# Patient Record
Sex: Female | Born: 1980 | Race: White | Hispanic: No | Marital: Single | Smoking: Never smoker
Health system: Southern US, Community
[De-identification: ages and names within clinical notes are randomized; demographics above are authoritative.]

## PROBLEM LIST (undated history)

## (undated) DIAGNOSIS — F329 Major depressive disorder, single episode, unspecified: Secondary | ICD-10-CM

## (undated) DIAGNOSIS — T7840XA Allergy, unspecified, initial encounter: Secondary | ICD-10-CM

## (undated) DIAGNOSIS — F419 Anxiety disorder, unspecified: Secondary | ICD-10-CM

## (undated) DIAGNOSIS — G43909 Migraine, unspecified, not intractable, without status migrainosus: Secondary | ICD-10-CM

## (undated) DIAGNOSIS — Z8049 Family history of malignant neoplasm of other genital organs: Secondary | ICD-10-CM

## (undated) DIAGNOSIS — Z803 Family history of malignant neoplasm of breast: Secondary | ICD-10-CM

## (undated) DIAGNOSIS — J45909 Unspecified asthma, uncomplicated: Secondary | ICD-10-CM

## (undated) DIAGNOSIS — N390 Urinary tract infection, site not specified: Secondary | ICD-10-CM

## (undated) DIAGNOSIS — F32A Depression, unspecified: Secondary | ICD-10-CM

## (undated) HISTORY — PX: BREAST EXCISIONAL BIOPSY: SUR124

## (undated) HISTORY — DX: Family history of malignant neoplasm of breast: Z80.3

## (undated) HISTORY — DX: Migraine, unspecified, not intractable, without status migrainosus: G43.909

## (undated) HISTORY — DX: Allergy, unspecified, initial encounter: T78.40XA

## (undated) HISTORY — DX: Urinary tract infection, site not specified: N39.0

## (undated) HISTORY — DX: Major depressive disorder, single episode, unspecified: F32.9

## (undated) HISTORY — DX: Family history of malignant neoplasm of other genital organs: Z80.49

## (undated) HISTORY — DX: Depression, unspecified: F32.A

---
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
## 2009-03-04 HISTORY — PX: BREAST BIOPSY: SHX20

## 2014-03-30 ENCOUNTER — Encounter (INDEPENDENT_AMBULATORY_CARE_PROVIDER_SITE_OTHER): Payer: Self-pay

## 2014-03-30 ENCOUNTER — Ambulatory Visit (INDEPENDENT_AMBULATORY_CARE_PROVIDER_SITE_OTHER): Payer: BC Managed Care – PPO | Admitting: Family Medicine

## 2014-03-30 ENCOUNTER — Encounter: Payer: Self-pay | Admitting: Family Medicine

## 2014-03-30 VITALS — BP 112/70 | HR 79 | Temp 98.5°F | Ht 63.0 in | Wt 141.8 lb

## 2014-03-30 DIAGNOSIS — J309 Allergic rhinitis, unspecified: Secondary | ICD-10-CM | POA: Insufficient documentation

## 2014-03-30 DIAGNOSIS — G43829 Menstrual migraine, not intractable, without status migrainosus: Secondary | ICD-10-CM

## 2014-03-30 DIAGNOSIS — Z23 Encounter for immunization: Secondary | ICD-10-CM

## 2014-03-30 DIAGNOSIS — J302 Other seasonal allergic rhinitis: Secondary | ICD-10-CM | POA: Diagnosis not present

## 2014-03-30 DIAGNOSIS — Z8659 Personal history of other mental and behavioral disorders: Secondary | ICD-10-CM

## 2014-03-30 DIAGNOSIS — Z1231 Encounter for screening mammogram for malignant neoplasm of breast: Secondary | ICD-10-CM

## 2014-03-30 DIAGNOSIS — Z803 Family history of malignant neoplasm of breast: Secondary | ICD-10-CM

## 2014-03-30 DIAGNOSIS — G43909 Migraine, unspecified, not intractable, without status migrainosus: Secondary | ICD-10-CM | POA: Insufficient documentation

## 2014-03-30 DIAGNOSIS — Z1239 Encounter for other screening for malignant neoplasm of breast: Secondary | ICD-10-CM

## 2014-03-30 DIAGNOSIS — N943 Premenstrual tension syndrome: Secondary | ICD-10-CM

## 2014-03-30 MED ORDER — MICROGESTIN FE 1/20 1-20 MG-MCG PO TABS: 1.0000 | ORAL_TABLET | Freq: Every day | ORAL | Status: DC

## 2014-03-30 NOTE — Patient Instructions (Signed)
Flu shot today  Stop at check out for ref to oncology and also for mammogram  Schedule annual exam with labs prior in April or May

## 2014-03-30 NOTE — Progress Notes (Signed)
Subjective:    Patient ID: Kirsten Wilson, female    DOB: Jan 26, 1981, 34 y.o.   MRN: [id number]  HPI [place] - used to live here  She teaches at [employer]   PMH: Asthma -only when sick/ uses a rescue inhaler  Had it as a kid   Allergies  - seasonal / grass/mold/mildew and dust as well   Depression - once in a while , now now  Has had counseling and medication in the past - last took lexapro  It comes and goes - worse with stress   Migraine - Dr Domingo Cocking  Stays away from caffeine , and also menstrual migraines - right before and after  occ aura but not every time  On imitrex and doing trigger point injections -getting approved for botox (works very well for her)   Has family history of breast cancer  Mother- uterine ca at 69 and then breast cancer shortly after  Paunt - breast cancer in her 40s  She herself is BRCA negative however - she is still considered high risk -now she has yearly mammogram and MRI (breast) with contrast   Last labs Jan with Dr Domingo Cocking - ? Last labs   Due for MRI - June Mammogram now   Not planning on getting pregnant  Will need her OC refilled -does well on that  Not due for pap until march    Patient Active Problem List   Diagnosis Date Noted  . Breast cancer screening, high risk patient 03/30/2014  . Allergic rhinitis 03/30/2014  . Migraine 03/30/2014  . History of depression 03/30/2014  . Family history of breast cancer 03/30/2014   Past Medical History  Diagnosis Date  . Allergy   . Depression   . Migraine   . UTI (lower urinary tract infection)    Past Surgical History  Procedure Laterality Date  . Breast biopsy  2011  . Tonsillectomy and adenoidectomy  1993   History  Substance Use Topics  . Smoking status: Never Smoker   . Smokeless tobacco: Not on file  . Alcohol Use: 0.0 oz/week    0 Not specified per week     Comment: occ   Family History  Problem Relation  Age of Onset  . Cancer Mother     breast and uterus cancer  . Hypertension Mother   . Hyperlipidemia Father   . Hypertension Father   . Diabetes Father   . Cancer Maternal Grandfather     lung  . Cancer Paternal Grandmother     colon cancer  . Alcohol abuse Paternal Grandfather    Allergies  Allergen Reactions  . Codone [Hydrocodone]   . Penicillins   . Sulfur    No current outpatient prescriptions on file prior to visit.   No current facility-administered medications on file prior to visit.    Review of Systems Review of Systems  Constitutional: Negative for fever, appetite change, fatigue and unexpected weight change.  Eyes: Negative for pain and visual disturbance.  Respiratory: Negative for cough and shortness of breath.   Cardiovascular: Negative for cp or palpitations    Gastrointestinal: Negative for nausea, diarrhea and constipation.  Genitourinary: Negative for urgency and frequency.  Skin: Negative for pallor or rash   Neurological: Negative for weakness, light-headedness, numbness and headaches.  Hematological: Negative for adenopathy. Does not bruise/bleed easily.  Psychiatric/Behavioral: Negative for dysphoric mood. The patient is not nervous/anxious.  Objective:   Physical Exam  Constitutional: She appears well-developed and well-nourished. No distress.  HENT:  Head: Normocephalic and atraumatic.  Mouth/Throat: Oropharynx is clear and moist.  Eyes: Conjunctivae and EOM are normal. Pupils are equal, round, and reactive to light. No scleral icterus.  Neck: Normal range of motion. Neck supple. No JVD present. Carotid bruit is not present. No thyromegaly present.  Cardiovascular: Normal rate, regular rhythm, normal heart sounds and intact distal pulses.  Exam reveals no gallop.   Pulmonary/Chest: Effort normal and breath sounds normal. No respiratory distress. She has no wheezes. She has no rales.  Abdominal: Soft. Bowel sounds are normal.    Genitourinary: No breast swelling, tenderness, discharge or bleeding.  Breast exam: No mass, nodules, thickening, tenderness, bulging, retraction, inflamation, nipple discharge or skin changes noted.  No axillary or clavicular LA.     Dense breasts   Musculoskeletal: She exhibits no edema or tenderness.  Lymphadenopathy:    She has no cervical adenopathy.  Neurological: She is alert. She has normal reflexes. No cranial nerve deficit. She exhibits normal muscle tone. Coordination normal.  Skin: Skin is warm and dry. No rash noted. No erythema. No pallor.  Psychiatric: She has a normal mood and affect.          Assessment & Plan:   Problem List Items Addressed This Visit      Cardiovascular and Mediastinum   Migraine   Relevant Medications   SUMAtriptan (IMITREX) 100 MG tablet     Respiratory   Allergic rhinitis    Seasonal  Takes med prn        Other   Breast cancer screening, high risk patient - Primary    Mother had breast and uterine cancer  Pt is BRCA negative- but still considered high risk  Past oncologist recommends mammogram and MRI yearly (staggered by 6 mo)  Has dense breasts and one neg bx in the past Ref to oncol for ongoing f/u Also ref for her annual mammogram       Relevant Orders   MM DIGITAL SCREENING BILATERAL   Family history of breast cancer    Mother had breast and uterine ca and melanoma  Paunt with breast ca Pt is high risk      Relevant Orders   Ambulatory referral to Oncology   History of depression    Intermittent with ssri in the past and counseling Doing well now       Other Visit Diagnoses    Flu vaccine need        Relevant Orders    Flu Vaccine QUAD 36+ mos IM (Completed)

## 2014-03-30 NOTE — Progress Notes (Signed)
Pre visit review using our clinic review tool, if applicable. No additional management support is needed unless otherwise documented below in the visit note. 

## 2014-03-31 NOTE — Assessment & Plan Note (Signed)
Mother had breast and uterine ca and melanoma  Paunt with breast ca Pt is high risk

## 2014-03-31 NOTE — Assessment & Plan Note (Signed)
Mother had breast and uterine cancer  Pt is BRCA negative- but still considered high risk  Past oncologist recommends mammogram and MRI yearly (staggered by 6 mo)  Has dense breasts and one neg bx in the past Ref to oncol for ongoing f/u Also ref for her annual mammogram

## 2014-03-31 NOTE — Assessment & Plan Note (Signed)
Intermittent with ssri in the past and counseling Doing well now

## 2014-03-31 NOTE — Assessment & Plan Note (Signed)
Seasonal  Takes med prn

## 2014-04-07 ENCOUNTER — Telehealth: Payer: Self-pay | Admitting: *Deleted

## 2014-04-07 NOTE — Telephone Encounter (Signed)
Received referral from Dr. Marliss Coots office for High Risk. Called and left a message for the pt to return my call so I can schedule her.

## 2014-04-08 ENCOUNTER — Telehealth: Payer: Self-pay | Admitting: *Deleted

## 2014-04-08 NOTE — Telephone Encounter (Signed)
Pt returned my call and I confirmed 04/26/14 high risk appt w/ her.  Mailed calendar, welcoming packet & intake form to pt.

## 2014-04-18 ENCOUNTER — Ambulatory Visit: Payer: BC Managed Care – PPO

## 2014-04-18 ENCOUNTER — Encounter: Payer: Self-pay | Admitting: *Deleted

## 2014-04-18 NOTE — Progress Notes (Signed)
Completed chart & placed in Dr. Virgie Dad box.  Took copy of records to HIM to scan.

## 2014-04-19 ENCOUNTER — Ambulatory Visit
Admission: RE | Admit: 2014-04-19 | Discharge: 2014-04-19 | Disposition: A | Discharge status: Home / Self Care | Payer: BC Managed Care – PPO | Source: Ambulatory Visit | Attending: Family Medicine | Admitting: Family Medicine

## 2014-04-19 DIAGNOSIS — Z1239 Encounter for other screening for malignant neoplasm of breast: Secondary | ICD-10-CM

## 2014-04-25 ENCOUNTER — Other Ambulatory Visit: Payer: Self-pay | Admitting: *Deleted

## 2014-04-25 DIAGNOSIS — Z1239 Encounter for other screening for malignant neoplasm of breast: Secondary | ICD-10-CM

## 2014-04-26 ENCOUNTER — Encounter: Payer: Self-pay | Admitting: Oncology

## 2014-04-26 ENCOUNTER — Ambulatory Visit: Payer: BC Managed Care – PPO

## 2014-04-26 ENCOUNTER — Ambulatory Visit (HOSPITAL_BASED_OUTPATIENT_CLINIC_OR_DEPARTMENT_OTHER): Payer: BC Managed Care – PPO | Admitting: Oncology

## 2014-04-26 ENCOUNTER — Other Ambulatory Visit (HOSPITAL_BASED_OUTPATIENT_CLINIC_OR_DEPARTMENT_OTHER): Payer: BC Managed Care – PPO

## 2014-04-26 VITALS — BP 126/80 | HR 72 | Temp 98.3°F | Resp 18 | Ht 63.0 in | Wt 143.2 lb

## 2014-04-26 DIAGNOSIS — Z803 Family history of malignant neoplasm of breast: Secondary | ICD-10-CM

## 2014-04-26 DIAGNOSIS — Z801 Family history of malignant neoplasm of trachea, bronchus and lung: Secondary | ICD-10-CM

## 2014-04-26 DIAGNOSIS — Z1239 Encounter for other screening for malignant neoplasm of breast: Secondary | ICD-10-CM

## 2014-04-26 DIAGNOSIS — G43109 Migraine with aura, not intractable, without status migrainosus: Secondary | ICD-10-CM

## 2014-04-26 DIAGNOSIS — Z808 Family history of malignant neoplasm of other organs or systems: Secondary | ICD-10-CM

## 2014-04-26 LAB — CBC WITH DIFFERENTIAL/PLATELET
BASO%: 0.7 % (ref 0.0–2.0)
Basophils Absolute: 0.1 10*3/uL (ref 0.0–0.1)
EOS%: 0.9 % (ref 0.0–7.0)
Eosinophils Absolute: 0.1 10*3/uL (ref 0.0–0.5)
HCT: 44.5 % (ref 34.8–46.6)
HGB: 14.3 g/dL (ref 11.6–15.9)
LYMPH%: 33 % (ref 14.0–49.7)
MCH: 28.6 pg (ref 25.1–34.0)
MCHC: 32.2 g/dL (ref 31.5–36.0)
MCV: 88.6 fL (ref 79.5–101.0)
MONO#: 0.4 10*3/uL (ref 0.1–0.9)
MONO%: 4.9 % (ref 0.0–14.0)
NEUT#: 4.9 10*3/uL (ref 1.5–6.5)
NEUT%: 60.5 % (ref 38.4–76.8)
Platelets: 217 10*3/uL (ref 145–400)
RBC: 5.02 10*6/uL (ref 3.70–5.45)
RDW: 12.8 % (ref 11.2–14.5)
WBC: 8.1 10*3/uL (ref 3.9–10.3)
lymph#: 2.7 10*3/uL (ref 0.9–3.3)

## 2014-04-26 LAB — COMPREHENSIVE METABOLIC PANEL (CC13)
ALT: 10 U/L (ref 0–55)
AST: 13 U/L (ref 5–34)
Albumin: 4.1 g/dL (ref 3.5–5.0)
Alkaline Phosphatase: 48 U/L (ref 40–150)
Anion Gap: 7 mEq/L (ref 3–11)
BUN: 10.7 mg/dL (ref 7.0–26.0)
CO2: 26 mEq/L (ref 22–29)
Calcium: 9.1 mg/dL (ref 8.4–10.4)
Chloride: 109 mEq/L (ref 98–109)
Creatinine: 0.8 mg/dL (ref 0.6–1.1)
EGFR: >90 mL/min/{1.73_m2} (ref >90)
Glucose: 98 mg/dl (ref 70–140)
Potassium: 4.3 mEq/L (ref 3.5–5.1)
Sodium: 143 mEq/L (ref 136–145)
Total Bilirubin: 0.36 mg/dL (ref 0.20–1.20)
Total Protein: 6.8 g/dL (ref 6.4–8.3)

## 2014-04-26 LAB — LACTATE DEHYDROGENASE (CC13): LDH: 152 U/L (ref 125–245)

## 2014-04-26 NOTE — Progress Notes (Signed)
Checked in new pt with no financial concerns prior to seeing the dr.  Informed pt if chemo is part of her treatment Raquel will call her ins to see if Josem Kaufmann is needed and will obtain it if it is as well as contact foundations that offer copay assistance for chemo if needed.  She has Raquel's card for any billing questions or concerns.

## 2014-04-26 NOTE — Progress Notes (Signed)
[facility]  [phone] [phone]     ID: Chayil Gantt DOB: 25-Nov-1980  MR#: [id number]  BPZ#:[id number]  Patient Care Team: Abner Greenspan, MD as PCP - General (Family Medicine) PCP: Loura Pardon, MD GYN: Felipa Emory MD SU:  OTHER MD: Rolm Bookbinder MD  CHIEF COMPLAINT: high risk family history  CURRENT TREATMENT: screening   HISTORY OF PRESENT ILLNESS: Kairie moved to this in the area July 2015, from [place]. She had previously lived here. In [place] she was followed by oncology, dermatology, and gynecology for a family history strongly suggestive of a genetic mutation putting her at increased risk for melanoma, endometrial and breast cancers (her mother had all three cancers at an early age). She did meet with genetic counseling there and on 05/07/2010 was tested for the BRCA1 and BRCA2 genes, with no mutation detected. A broader panel or BART testing were not performed.  She now presents to our high Risk clinic for futher evaluation and follow-up  INTERVAL HISTORY: I met with Daphney in the [place] clinic 04/26/2014. She has already established herself with Dr Glori Bickers as her PCP in this area. She does not yet have a dermatologist or gynecologist here.  REVIEW OF SYSTEMS:\She denies unusual headaches, nausea, vomiting, dizziness, or gait imbalance. There have been no visual changes. She denies cough, phlegm production, pleurisy, or shortness of breath. She does not have a history of palpitations, or heart murmur. There has been no change in bowel or bladder habits, and in particular no bloody or black looking stools. There has not been any unexplained weight loss or unexplained fatigue. Her periods are regular, not unusually heavy, and not accompanied by perimenstrual symptoms. A detailed review of systems today was otherwise stable  PAST MEDICAL HISTORY: Past Medical History  Diagnosis Date  . Allergy   . Depression   . Migraine     . UTI (lower urinary tract infection)     PAST SURGICAL HISTORY: Past Surgical History  Procedure Laterality Date  . Breast biopsy  2011  . Tonsillectomy and adenoidectomy  1993    FAMILY HISTORY Family History  Problem Relation Age of Onset  . Cancer Mother     breast and uterus cancer  . Hypertension Mother   . Hyperlipidemia Father   . Hypertension Father   . Diabetes Father   . Cancer Maternal Grandfather     lung  . Cancer Paternal Grandmother     colon cancer  . Alcohol abuse Paternal Grandfather    the patient's mother was diagnosed with cancer of the uterus at age 24, cancer of the breast at age 10, and melanoma at age 9. This may well represent a p53 mutation, but there are many other possibilities. Unfortunately the patient's mother, who is still living, refuses to be genetically tested. The patient's maternal grandfather had a history of brain and lung cancer (these apparently were 2 separate primaries). He was a smoker. He died at age 66. 50 of the patient's mother's siblings had skin cancers, but not melanomas. On the paternal side the paternal grandmother was diagnosed with colon cancer at age 84 and a paternal aunt was diagnosed with breast cancer at age 7. The patient's father has a history of precancerous polyps.  GYNECOLOGIC HISTORY:  Patient's last menstrual period was 03/22/2014. Menarche age 51. The patient is GX P0. She understands that waiting after age 23 to carry a child essentially doubles the risk of breast cancer.  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 She is single, and lives alone, with no pets    ADVANCED DIRECTIVES: Not in place. At her 04/26/2014 visit she was given the appropriate documents 2 complete and notarize our her discretion.   HEALTH MAINTENANCE: History  Substance Use Topics  . Smoking status: Never Smoker   . Smokeless tobacco: Not on file  . Alcohol Use: 0.0  oz/week    0 Standard drinks or equivalent per week     Comment: occ     Colonoscopy: Never  PAP: March 2015  Bone density: Never  Lipid panel:  Allergies  Allergen Reactions  . Codone [Hydrocodone]   . Penicillins   . Sulfur     Current Outpatient Prescriptions  Medication Sig Dispense Refill  . cetirizine (ZYRTEC) 10 MG tablet Take 10 mg by mouth daily.    Marland Kitchen MICROGESTIN FE 1/20 1-20 MG-MCG tablet Take 1 tablet by mouth daily. 1 Package 11  . SUMAtriptan (IMITREX) 100 MG tablet Take 100 mg by mouth every 2 (two) hours as needed for migraine or headache. May repeat in 2 hours if headache persists or recurs.     No current facility-administered medications for this visit.    OBJECTIVE: Young white woman who appears well Filed Vitals:   04/26/14 1623  BP: 126/80  Pulse: 72  Temp: 98.3 F (36.8 C)  Resp: 18     Body mass index is 25.37 kg/(m^2).    ECOG FS:0 - Asymptomatic  Ocular: Sclerae unicteric, pupils equal, round and reactive to light Ear-nose-throat: Oropharynx clear, dentition in good repair Lymphatic: No cervical or supraclavicular adenopathy Lungs no rales or rhonchi, good excursion bilaterally Heart regular rate and rhythm, no murmur appreciated Abd soft, nontender, positive bowel sounds, no masses palpated MSK no focal spinal tenderness, no joint edema Neuro: non-focal, well-oriented, positive affect Breasts: No masses palpated in either breast; no skin or nipple changes of concern; both axillae are benign. Skin: No suspicious lesions found in the upper body; lower body not examined  LAB RESULTS:  CMP     Component Value Date/Time   NA 143 04/26/2014 1547   K 4.3 04/26/2014 1547   CO2 26 04/26/2014 1547   GLUCOSE 98 04/26/2014 1547   BUN 10.7 04/26/2014 1547   CREATININE 0.8 04/26/2014 1547   CALCIUM 9.1 04/26/2014 1547   PROT 6.8 04/26/2014 1547   ALBUMIN 4.1 04/26/2014 1547   AST 13 04/26/2014 1547   ALT 10 04/26/2014 1547   ALKPHOS 48  04/26/2014 1547   BILITOT 0.36 04/26/2014 1547    INo results found for: SPEP, UPEP  Lab Results  Component Value Date   WBC 8.1 04/26/2014   NEUTROABS 4.9 04/26/2014   HGB 14.3 04/26/2014   HCT 44.5 04/26/2014   MCV 88.6 04/26/2014   PLT 217 04/26/2014      Chemistry      Component Value Date/Time   NA 143 04/26/2014 1547   K 4.3 04/26/2014 1547   CO2 26 04/26/2014 1547   BUN 10.7 04/26/2014 1547   CREATININE 0.8 04/26/2014 1547      Component Value Date/Time   CALCIUM 9.1 04/26/2014 1547   ALKPHOS 48 04/26/2014 1547   AST 13 04/26/2014 1547   ALT 10 04/26/2014 1547   BILITOT 0.36 04/26/2014 1547       No results found for: LABCA2  No components found for: LABCA125  No results for input(s): INR in the  last 168 hours.  Urinalysis No results found for: COLORURINE, APPEARANCEUR, LABSPEC, PHURINE, GLUCOSEU, HGBUR, BILIRUBINUR, KETONESUR, PROTEINUR, UROBILINOGEN, NITRITE, LEUKOCYTESUR  STUDIES: Mm Digital Screening Bilateral  04/20/2014   CLINICAL DATA:  Screening.  EXAM: DIGITAL SCREENING BILATERAL MAMMOGRAM WITH CAD  COMPARISON:  None.  ACR Breast Density Category d: The breast tissue is extremely dense, which lowers the sensitivity of mammography.  FINDINGS: There are no findings suspicious for malignancy. Images were processed with CAD.  IMPRESSION: No mammographic evidence of malignancy. A result letter of this screening mammogram will be mailed directly to the patient.  RECOMMENDATION: Screening mammogram at age 30. (Code:SM-B-40A)  BI-RADS CATEGORY  1: Negative.   Electronically Signed   By: Lajean Manes M.D.   On: 04/20/2014 09:47    ASSESSMENT: 34 y.o. [place] woman with a high-risk family history for breast, uterine, melanoma and colon cancers  (1) BRCA 1 and 2 testing 05/07/2010 negative (BARTs not sent)  (2) genetics testing via a wider panel pending  PLAN: Kirsten Wilson has a good understanding of her overall situation, having met previously with a  Retail buyer. The big concern of course is her mother. The other cancers in the family could well all be sporadic. It is a shame that her mother does not allow herself to be tested as that would immediately clarify the situation.  At any rate I think the first thing to do is to broaden the panel and I am setting Sonnet up with our genetics counselor, Roma Kayser, to discuss testing of additional genes. Hopefully we will have those results within the next 2 months.  Anastasha needs to have at least yearly skin surveillance by a dermatologist. The patient also needs gynecology follow-up and I will see if we can arrange for those referrals before the patient returns to see me in August.  Regardless of genetics results, Byanka is at high risk for breast cancer given her family history and the fact that she has category D breast density. It is very reasonable for her to have an MRI 6 months after her mammogram and we can continue this pattern at least until her breast density improves with menopause. I have put in the orders for her to have an MRI in August and I will see her shortly after that to review results and to make sure her high-risk  screening team is all in place.  The patient has a good understanding of the overall plan. She agrees with it. She knows the goal of treatment in her case is prevention She will call with any problems that may develop before her next visit here.  Chauncey Cruel, MD   04/26/2014 5:03 PM Medical Oncology and Hematology [facility] [address] [place], [place] [address] Tel. [phone]    Fax. [phone]

## 2014-04-27 ENCOUNTER — Telehealth: Payer: Self-pay | Admitting: Oncology

## 2014-04-27 NOTE — Telephone Encounter (Signed)
lvm for pt regarding to Dermatology appt on 2.26 @ 1:50 with Dr. Pablo Ledger...Dr. Ubaldo Glassing is not accepting new pts....gv pt BC and Dr. Sabra Heck # to sched appt along with aug f/u with GM

## 2014-04-27 NOTE — Telephone Encounter (Signed)
Kirsten Wilson mis sched pt appt Dr. Elvera Lennox appt is r/s to March 10 @ 4:20pm

## 2014-05-05 ENCOUNTER — Encounter: Payer: Self-pay | Admitting: Genetic Counselor

## 2014-05-05 ENCOUNTER — Other Ambulatory Visit: Payer: BC Managed Care – PPO

## 2014-05-05 ENCOUNTER — Ambulatory Visit (HOSPITAL_BASED_OUTPATIENT_CLINIC_OR_DEPARTMENT_OTHER): Payer: BC Managed Care – PPO | Admitting: Genetic Counselor

## 2014-05-05 DIAGNOSIS — Z803 Family history of malignant neoplasm of breast: Secondary | ICD-10-CM | POA: Insufficient documentation

## 2014-05-05 DIAGNOSIS — Z8049 Family history of malignant neoplasm of other genital organs: Secondary | ICD-10-CM | POA: Insufficient documentation

## 2014-05-05 DIAGNOSIS — Z315 Encounter for genetic counseling: Secondary | ICD-10-CM

## 2014-05-05 NOTE — Progress Notes (Signed)
Patient Name: Naliah Eddington Patient Age: 34 y.o. Encounter Date: 05/05/2014  Referring Physician: Lurline Del, MD  Primary Care Provider: Loura Pardon, MD   Ms. Eladia Frame, a 34 y.o. female, is being seen at the [facility] due to a family history of cancer. She presents to clinic today to discuss the possibility of a hereditary predisposition to cancer and discuss whether genetic testing is warranted.  HISTORY OF PRESENT ILLNESS: Ms. Recktenwald has no personal history of cancer and is in generally good health. She stated that she undergoes a yearly mammogram, breast MRI, clinical breast exam and gynecologic exam. She has been on OCPs since age 21/16.  Past Medical History  Diagnosis Date  . Allergy   . Depression   . Migraine   . UTI (lower urinary tract infection)   . Family history of malignant neoplasm of breast   . Family history of uterine cancer     Past Surgical History  Procedure Laterality Date  . Breast biopsy  2011  . Tonsillectomy and adenoidectomy  1993    History   Social History  . Marital Status: Single    Spouse Name: N/A  . Number of Children: N/A  . Years of Education: N/A   Social History Main Topics  . Smoking status: Never Smoker   . Smokeless tobacco: Not on file  . Alcohol Use: 0.0 oz/week    0 Standard drinks or equivalent per week     Comment: occ  . Drug Use: No  . Sexual Activity: Not on file   Other Topics Concern  . Not on file   Social History Narrative     FAMILY HISTORY:   During the visit, a 4-generation pedigree was obtained. Family tree will be sent for scanning and will be in EPIC under the Media tab.  Significant diagnoses include the following:  Family History  Problem Relation Age of Onset  . Cancer Mother     uterine @ 45, breast @ 71, and melanoma @ 53  . Hypertension Mother   . Hyperlipidemia Father   . Hypertension Father   . Diabetes Father   . Cancer Maternal Grandfather     lung;  smoker; deceased 75  . Cancer Paternal Grandmother 27    colon cancer; deceased 32  . Alcohol abuse Paternal Grandfather   . Cancer Maternal Aunt     skin & colon polyps (#/type?)  . Cancer Maternal Uncle     skin  . Cancer Paternal Aunt 60    breast; currently 79    Additionally, Ms. Wheat has no children. She has a brother (age 31).  Ms. Mcclish ancestry is [place], [place] and [place]. There is no known Jewish ancestry and no consanguinity.  ASSESSMENT AND PLAN: Ms. Mckercher is a 34 y.o. female with a family history of various cancers as described above. Her mother's history is suggestive of a hereditary predisposition to cancer given the very young age of her cancers. Unfortunately, she is unwilling to undergo genetic testing. We reviewed the characteristics, features and inheritance patterns of hereditary cancer syndromes. We also discussed genetic testing using an expanded panel since Ms. Swingle has already had negative BRCA testing, including the process of testing, insurance coverage and implications of results. A negative result will be somewhat reassuring, but without knowing her mother's genetic status, she should continue with the increased breast cancer screenings she currently undergoes.  Ms. Secrist wished to pursue genetic testing and a blood sample will be sent to  [facility] for analysis of the 24 genes on the OvaNext gene panel (ATM, BARD1, BRCA1, BRCA2, BRIP1, CDH1, CHEK2, EPCAM, MLH1, MRE11A, MSH2, MSH6, MUTYH, NBN, NF1, PALB2, PMS2, PTEN, RAD50, RAD51C, RAD51D, SMARCA4, STK11, and TP53). This panel was selected to include the Lynch-associated genes given her mother's young age at uterine cancer diagnosis and reported colon polyps in her maternal aunt. We discussed the implications of a positive, negative and/ or Variant of Uncertain Significance (VUS) result. Results should be available in approximately 4-5 weeks, at which point we will contact her and address  implications for her as well as address genetic testing for at-risk family members, if needed.    We encouraged Ms. Brandstetter to remain in contact with Cancer Genetics annually so that we can update the family history and inform her of any changes in cancer genetics and testing that may be of benefit for this family. Ms.  Lien questions were answered to her satisfaction today.   Thank you for the referral and allowing [place] to share in the care of your patient.   The patient was seen for a total of 30 minutes, greater than 50% of which was spent face-to-face counseling. This patient was discussed with the overseeing provider who agrees with the above.   Steele Berg, MS, [place] Certified Genetic Counselor phone: [phone] [email or web address]

## 2014-05-13 ENCOUNTER — Telehealth: Payer: Self-pay | Admitting: Obstetrics & Gynecology

## 2014-05-13 NOTE — Telephone Encounter (Signed)
Left patient a message to call back to schedule a new patient doctor referral appointment.

## 2014-05-17 NOTE — Telephone Encounter (Signed)
Left patient a message to call back to schedule.   °

## 2014-05-23 NOTE — Telephone Encounter (Signed)
Left patient a message to call back.

## 2014-05-24 ENCOUNTER — Telehealth: Payer: Self-pay | Admitting: Nurse Practitioner

## 2014-05-24 DIAGNOSIS — N3000 Acute cystitis without hematuria: Secondary | ICD-10-CM

## 2014-05-24 MED ORDER — CIPROFLOXACIN HCL 500 MG PO TABS: 500.0000 mg | ORAL_TABLET | Freq: Two times a day (BID) | ORAL | Status: DC

## 2014-05-24 NOTE — Progress Notes (Signed)
We are sorry that you are not feeling well.  Here is how we plan to help!  Based on what you shared with me it looks like you most likely have a simple urinary tract infection.  A UTI (Urinary Tract Infection) is a bacterial infection of the bladder.  Most cases of urinary tract infections are simple to treat but a key part of your care is to encourage you to drink plenty of fluids and watch your symptoms carefully.  I have prescribed Cipro, a antibiotic, twice a day for 3 days.  Your symptoms should gradually improve. Call us if the burning in your urine worsens, you develop worsening fever, back pain or pelvic pain or if your symptoms do not resolve after completing the antibiotic.  Urinary tract infections can be prevented by drinking plenty of water to keep your body hydrated.  Also be sure when you wipe, wipe from front to back and don't hold it in!  If possible, empty your bladder every 4 hours.  Your e-visit answers were reviewed by a board certified advanced clinical practitioner to complete your personal care plan.  Depending on the condition, your plan could have included both over the counter or prescription medications.  If there is a problem please reply  once you have received a response from your provider.  Your safety is important to [place].  If you have drug allergies check your prescription carefully.    You can use MyChart to ask questions about today's visit, request a non-urgent call back, or ask for a work or school excuse.  You will get an e-mail in the next two days asking about your experience.  I hope that your e-visit has been valuable and will speed your recovery. Thank you for using e-visits.

## 2014-05-24 NOTE — Telephone Encounter (Signed)
Scheduled

## 2014-05-27 ENCOUNTER — Encounter: Payer: Self-pay | Admitting: Genetic Counselor

## 2014-05-27 DIAGNOSIS — Z1379 Encounter for other screening for genetic and chromosomal anomalies: Secondary | ICD-10-CM | POA: Insufficient documentation

## 2014-05-27 NOTE — Progress Notes (Signed)
GENETIC TEST RESULTS  Patient Name: Kirsten Wilson Patient Age: 33 y.o. Encounter Date: 05/27/2014  Referring Physician: Gustav Magrinat, MD   Kirsten Wilson was called today to discuss genetic test results. Please see the Genetics note from her visit on 05/05/14 for a detailed discussion of her personal and family history.  GENETIC TESTING: At the time of Kirsten Wilson's visit, we recommended she pursue genetic testing of multiple genes on the BreastNext gene panel. This test, which included sequencing and deletion/duplication analysis of 17 genes, was performed at [facility]. Testing was normal and did not reveal a mutation in these genes. The genes tested were ATM, BARD1, BRCA1, BRCA2, BRIP1, CDH1, CHEK2, MRE11A, MUTYH, NBN, NF1, PALB2, PTEN, RAD50, RAD51C, RAD51D, and TP53.  We discussed with Kirsten Wilson that since the current test is not perfect, it is possible there may be a gene mutation that current testing cannot detect, but that chance is small. We also discussed that it is possible that a different genetic factor, which was not part of this testing or has not yet been discovered, is responsible for the cancer diagnoses in the family. Finally, it may be that there is a detectable mutation in her family that she did not inherit. It is highly recommended that her mother, who had uterine, breast and melanoma cancers, get tested.   Should Kirsten Wilson wish to discuss or pursue this additional testing, we are happy to coordinate this at any time, but do not feel that she is at significant risk of harboring a mutation in a different gene.     CANCER SCREENING: Given the family history of uterine, breast and melanoma cancers in her mother at very young ages, we must interpret these negative results with some caution. Her family has features suggestive of hereditary risk given her mother's history. Until a cause to her mother's cancers is identified, Kirsten Wilson should be screened with a  yearly mammogram and breast MRI as well as have a clinical breast exam twice per year. She was recommended to continue her yearly gynecologic exam.  FAMILY MEMBERS: We again stress the need for genetic testing in Kirsten Wilson's mother, as such testing might help define  Kirsten Wilson's risks more accurately. Genetic testing is best initiated in someone who has had cancer. Unfortunately, Kirsten Wilson's mother has declined testing on multiple occasions. If she changes her mind, genetic counselors can be located in other cities by visiting the website of the National Society of Genetic Counselors (www.nsgc.org) and searching for a genetic counselor by zip code.  Lastly, we discussed with Kirsten Wilson that cancer genetics is a rapidly advancing field and it is possible that new genetic tests will be appropriate for her in the future. We encouraged her to remain in contact with us on an annual basis so we can update her personal and family histories, and let her know of advances in cancer genetics that may benefit the family. Our contact number was provided. Kirsten Wilson's questions were answered to her satisfaction today, and she knows she is welcome to call anytime with additional questions.    Ofri Leitner, MS, CGC Certified Genetic Counselor phone: [phone] [email or web address] 

## 2014-06-07 ENCOUNTER — Encounter: Payer: Self-pay | Admitting: Family Medicine

## 2014-06-08 ENCOUNTER — Telehealth: Payer: Self-pay | Admitting: Family Medicine

## 2014-06-08 DIAGNOSIS — Z0000 Encounter for general adult medical examination without abnormal findings: Secondary | ICD-10-CM

## 2014-06-08 NOTE — Telephone Encounter (Signed)
-----   Message from Ellamae Sia sent at 06/07/2014  3:14 PM EDT ----- Regarding: Lab orders for Thursday, 4.7.16 Patient is scheduled for CPX labs, please order future labs, Thanks , Karna Christmas

## 2014-06-09 ENCOUNTER — Other Ambulatory Visit (INDEPENDENT_AMBULATORY_CARE_PROVIDER_SITE_OTHER): Payer: BC Managed Care – PPO

## 2014-06-09 DIAGNOSIS — Z0000 Encounter for general adult medical examination without abnormal findings: Secondary | ICD-10-CM | POA: Diagnosis not present

## 2014-06-10 LAB — COMPREHENSIVE METABOLIC PANEL
ALT: 9 U/L (ref 0–35)
AST: 14 U/L (ref 0–37)
Albumin: 4 g/dL (ref 3.5–5.2)
Alkaline Phosphatase: 42 U/L (ref 39–117)
BUN: 9 mg/dL (ref 6–23)
CO2: 28 mEq/L (ref 19–32)
Calcium: 9.2 mg/dL (ref 8.4–10.5)
Chloride: 102 mEq/L (ref 96–112)
Creatinine, Ser: 0.67 mg/dL (ref 0.40–1.20)
GFR: 106.71 mL/min (ref >60.00)
Glucose, Bld: 86 mg/dL (ref 70–99)
Potassium: 3.9 mEq/L (ref 3.5–5.1)
Sodium: 136 mEq/L (ref 135–145)
Total Bilirubin: 0.3 mg/dL (ref 0.2–1.2)
Total Protein: 6.7 g/dL (ref 6.0–8.3)

## 2014-06-10 LAB — CBC WITH DIFFERENTIAL/PLATELET
Basophils Absolute: 0 10*3/uL (ref 0.0–0.1)
Basophils Relative: 0.6 % (ref 0.0–3.0)
Eosinophils Absolute: 0.1 10*3/uL (ref 0.0–0.7)
Eosinophils Relative: 1 % (ref 0.0–5.0)
HCT: 40 % (ref 36.0–46.0)
Hemoglobin: 13.8 g/dL (ref 12.0–15.0)
Lymphocytes Relative: 27.9 % (ref 12.0–46.0)
Lymphs Abs: 2.1 10*3/uL (ref 0.7–4.0)
MCHC: 34.4 g/dL (ref 30.0–36.0)
MCV: 85.8 fl (ref 78.0–100.0)
Monocytes Absolute: 0.5 10*3/uL (ref 0.1–1.0)
Monocytes Relative: 6.4 % (ref 3.0–12.0)
Neutro Abs: 4.9 10*3/uL (ref 1.4–7.7)
Neutrophils Relative %: 64.1 % (ref 43.0–77.0)
Platelets: 211 10*3/uL (ref 150.0–400.0)
RBC: 4.67 Mil/uL (ref 3.87–5.11)
RDW: 12.9 % (ref 11.5–15.5)
WBC: 7.7 10*3/uL (ref 4.0–10.5)

## 2014-06-10 LAB — LIPID PANEL
Cholesterol: 164 mg/dL (ref 0–200)
HDL: 45.5 mg/dL (ref >39.00)
LDL Cholesterol: 101 mg/dL — ABNORMAL HIGH (ref 0–99)
NonHDL: 118.5
Total CHOL/HDL Ratio: 4
Triglycerides: 87 mg/dL (ref 0.0–149.0)
VLDL: 17.4 mg/dL (ref 0.0–40.0)

## 2014-06-10 LAB — TSH: TSH: 1.57 u[IU]/mL (ref 0.35–4.50)

## 2014-06-14 ENCOUNTER — Ambulatory Visit (INDEPENDENT_AMBULATORY_CARE_PROVIDER_SITE_OTHER): Payer: BC Managed Care – PPO | Admitting: Family Medicine

## 2014-06-14 ENCOUNTER — Encounter: Payer: Self-pay | Admitting: Family Medicine

## 2014-06-14 VITALS — BP 130/78 | HR 88 | Temp 98.0°F | Ht 63.5 in | Wt 142.8 lb

## 2014-06-14 DIAGNOSIS — R350 Frequency of micturition: Secondary | ICD-10-CM | POA: Diagnosis not present

## 2014-06-14 DIAGNOSIS — Z0000 Encounter for general adult medical examination without abnormal findings: Secondary | ICD-10-CM

## 2014-06-14 DIAGNOSIS — Z1231 Encounter for screening mammogram for malignant neoplasm of breast: Secondary | ICD-10-CM | POA: Diagnosis not present

## 2014-06-14 DIAGNOSIS — Z23 Encounter for immunization: Secondary | ICD-10-CM | POA: Diagnosis not present

## 2014-06-14 DIAGNOSIS — Z1239 Encounter for other screening for malignant neoplasm of breast: Secondary | ICD-10-CM

## 2014-06-14 LAB — POCT URINALYSIS DIPSTICK
Bilirubin, UA: NEGATIVE
Glucose, UA: NEGATIVE
Ketones, UA: NEGATIVE
Nitrite, UA: NEGATIVE
Protein, UA: NEGATIVE
Spec Grav, UA: 1.015
Urobilinogen, UA: 0.2
pH, UA: 6

## 2014-06-14 NOTE — Progress Notes (Signed)
Subjective:    Patient ID: Kirsten Wilson, female    DOB: 02/02/1981, 34 y.o.   MRN: [id number]  HPI Here for health maintenance exam and to review chronic medical problems    Wt is down 1 lb  bmi is 24  Still hard to find time for exercise  Is working on giving up soda - and eat healthier   HIV screen- declines - not high risk   Tdap- ? When - will update today  Flu shot in Jan 2016   Pap 3/15 Has appt with Dr Sabra Heck -gyn upcoming [place] in 2/16 -normal - happy about that  No changes on self exam   Has est with oncology - will continue to follow for her very high risk status (due to fam hx)  Had more genetic testing that was all normal   Results for orders placed or performed in visit on 06/09/14  CBC with Differential/Platelet  Result Value Ref Range   WBC 7.7 4.0 - 10.5 K/uL   RBC 4.67 3.87 - 5.11 Mil/uL   Hemoglobin 13.8 12.0 - 15.0 g/dL   HCT 40.0 36.0 - 46.0 %   MCV 85.8 78.0 - 100.0 fl   MCHC 34.4 30.0 - 36.0 g/dL   RDW 12.9 11.5 - 15.5 %   Platelets 211.0 150.0 - 400.0 K/uL   Neutrophils Relative % 64.1 43.0 - 77.0 %   Lymphocytes Relative 27.9 12.0 - 46.0 %   Monocytes Relative 6.4 3.0 - 12.0 %   Eosinophils Relative 1.0 0.0 - 5.0 %   Basophils Relative 0.6 0.0 - 3.0 %   Neutro Abs 4.9 1.4 - 7.7 K/uL   Lymphs Abs 2.1 0.7 - 4.0 K/uL   Monocytes Absolute 0.5 0.1 - 1.0 K/uL   Eosinophils Absolute 0.1 0.0 - 0.7 K/uL   Basophils Absolute 0.0 0.0 - 0.1 K/uL  Comprehensive metabolic panel  Result Value Ref Range   Sodium 136 135 - 145 mEq/L   Potassium 3.9 3.5 - 5.1 mEq/L   Chloride 102 96 - 112 mEq/L   CO2 28 19 - 32 mEq/L   Glucose, Bld 86 70 - 99 mg/dL   BUN 9 6 - 23 mg/dL   Creatinine, Ser 0.67 0.40 - 1.20 mg/dL   Total Bilirubin 0.3 0.2 - 1.2 mg/dL   Alkaline Phosphatase 42 39 - 117 U/L   AST 14 0 - 37 U/L   ALT 9 0 - 35 U/L   Total Protein 6.7 6.0 - 8.3 g/dL   Albumin 4.0 3.5 - 5.2 g/dL   Calcium 9.2 8.4 - 10.5 mg/dL   GFR 106.71 >60.00  mL/min  Lipid panel  Result Value Ref Range   Cholesterol 164 0 - 200 mg/dL   Triglycerides 87.0 0.0 - 149.0 mg/dL   HDL 45.50 >39.00 mg/dL   VLDL 17.4 0.0 - 40.0 mg/dL   LDL Cholesterol 101 (H) 0 - 99 mg/dL   Total CHOL/HDL Ratio 4    NonHDL 118.50   TSH  Result Value Ref Range   TSH 1.57 0.35 - 4.50 uIU/mL    Plans to exercise to get the HDL up a bit more   Was treated for uti recently - cipro for 3 days  Finished abx - last week in march  Now a bit of frequent urination and pressure -comes and goes - she did stop soda  No odor  No dysuria  No blood in urine   Has seen derm for skin check  Melanoma  in mother  No lesions  Will get checked every 2 years  Patient Active Problem List   Diagnosis Date Noted  . Routine general medical examination at a health care facility 06/08/2014  . Genetic testing 05/27/2014  . Family history of malignant neoplasm of breast   . Family history of uterine cancer   . Breast cancer screening, high risk patient 03/30/2014  . Allergic rhinitis 03/30/2014  . Migraine 03/30/2014  . History of depression 03/30/2014  . Family history of breast cancer 03/30/2014   Past Medical History  Diagnosis Date  . Allergy   . Depression   . Migraine   . UTI (lower urinary tract infection)   . Family history of malignant neoplasm of breast   . Family history of uterine cancer    Past Surgical History  Procedure Laterality Date  . Breast biopsy  2011  . Tonsillectomy and adenoidectomy  1993   History  Substance Use Topics  . Smoking status: Never Smoker   . Smokeless tobacco: Not on file  . Alcohol Use: 0.0 oz/week    0 Standard drinks or equivalent per week     Comment: occ   Family History  Problem Relation Age of Onset  . Cancer Mother     uterine @ 82, breast @ 38, and melanoma @ 67  . Hypertension Mother   . Hyperlipidemia Father   . Hypertension Father   . Diabetes Father   . Cancer Maternal Grandfather     lung; smoker; deceased  25  . Cancer Paternal Grandmother 75    colon cancer; deceased 57  . Alcohol abuse Paternal Grandfather   . Cancer Maternal Aunt     skin & colon polyps (#/type?)  . Cancer Maternal Uncle     skin  . Cancer Paternal Aunt 43    breast; currently 101   Allergies  Allergen Reactions  . Codone [Hydrocodone]   . Penicillins   . Sulfur    Current Outpatient Prescriptions on File Prior to Visit  Medication Sig Dispense Refill  . cetirizine (ZYRTEC) 10 MG tablet Take 10 mg by mouth daily.    Marland Kitchen MICROGESTIN FE 1/20 1-20 MG-MCG tablet Take 1 tablet by mouth daily. 1 Package 11  . SUMAtriptan (IMITREX) 100 MG tablet Take 100 mg by mouth every 2 (two) hours as needed for migraine or headache. May repeat in 2 hours if headache persists or recurs.     No current facility-administered medications on file prior to visit.     Review of Systems Review of Systems  Constitutional: Negative for fever, appetite change, fatigue and unexpected weight change.  Eyes: Negative for pain and visual disturbance.  Respiratory: Negative for cough and shortness of breath.   Cardiovascular: Negative for cp or palpitations    Gastrointestinal: Negative for nausea, diarrhea and constipation.  Genitourinary: pos for urgency and frequency. (in setting of recently tx uti with cipro for 3d) Skin: Negative for pallor or rash   Neurological: Negative for weakness, light-headedness, numbness and headaches.  Hematological: Negative for adenopathy. Does not bruise/bleed easily.  Psychiatric/Behavioral: Negative for dysphoric mood. The patient is not nervous/anxious.         Objective:   Physical Exam  Constitutional: She appears well-developed and well-nourished. No distress.  HENT:  Head: Normocephalic and atraumatic.  Right Ear: External ear normal.  Left Ear: External ear normal.  Nose: Nose normal.  Mouth/Throat: Oropharynx is clear and moist.  Eyes: Conjunctivae and EOM are normal. Pupils are  equal, round,  and reactive to light. Right eye exhibits no discharge. Left eye exhibits no discharge. No scleral icterus.  Neck: Normal range of motion. Neck supple. No JVD present. Carotid bruit is not present. No thyromegaly present.  Cardiovascular: Normal rate, regular rhythm, normal heart sounds and intact distal pulses.  Exam reveals no gallop.   Pulmonary/Chest: Effort normal and breath sounds normal. No respiratory distress. She has no wheezes. She has no rales.  Abdominal: Soft. Bowel sounds are normal. She exhibits no distension and no mass. There is no tenderness.  No suprapubic tenderness or fullness  No cva tenderness   Musculoskeletal: She exhibits no edema or tenderness.  Lymphadenopathy:    She has no cervical adenopathy.  Neurological: She is alert. She has normal reflexes. No cranial nerve deficit. She exhibits normal muscle tone. Coordination normal.  Skin: Skin is warm and dry. No rash noted. No erythema. No pallor.  Psychiatric: She has a normal mood and affect.          Assessment & Plan:   Problem List Items Addressed This Visit      Other   Breast cancer screening, high risk patient    Mammogram was nl in Feb Seeing oncol addn genetic testing was nl  Will continue current mammo yearly and MRI yearly (6 mo apart)  Will have breast exam with gyn      Routine general medical examination at a health care facility - Primary    Reviewed health habits including diet and exercise and skin cancer prevention Reviewed appropriate screening tests for age  Also reviewed health mt list, fam hx and immunization status , as well as social and family history   See HPI Labs reviewed  Exercise will help increase your good cholesterol (HDL)  Tdap today          Relevant Orders   Tdap vaccine greater than or equal to 7yo IM (Completed)   Urinary frequency   Relevant Orders   POCT urinalysis dipstick (Completed)   Urine culture    Other Visit Diagnoses    Need for vaccination         Relevant Orders    Tdap vaccine greater than or equal to 7yo IM (Completed)

## 2014-06-14 NOTE — Progress Notes (Signed)
Pre visit review using our clinic review tool, if applicable. No additional management support is needed unless otherwise documented below in the visit note. 

## 2014-06-14 NOTE — Patient Instructions (Signed)
Please leave a urine specimen on the way out  Drink water and avoid other beverages - avoid cranberry for now  Take care of yourself Exercise will help increase your good cholesterol (HDL)  Tdap today

## 2014-06-15 NOTE — Assessment & Plan Note (Signed)
Mammogram was nl in Feb Seeing oncol addn genetic testing was nl  Will continue current mammo yearly and MRI yearly (6 mo apart)  Will have breast exam with gyn

## 2014-06-15 NOTE — Assessment & Plan Note (Signed)
Recently tx uti with cipro for 3d  Symptoms not completely gone  ua and cx

## 2014-06-15 NOTE — Assessment & Plan Note (Signed)
Reviewed health habits including diet and exercise and skin cancer prevention Reviewed appropriate screening tests for age  Also reviewed health mt list, fam hx and immunization status , as well as social and family history   See HPI Labs reviewed  Exercise will help increase your good cholesterol (HDL)  Tdap today

## 2014-06-16 LAB — URINE CULTURE
Colony Count: NO GROWTH
Organism ID, Bacteria: NO GROWTH

## 2014-07-21 ENCOUNTER — Ambulatory Visit (INDEPENDENT_AMBULATORY_CARE_PROVIDER_SITE_OTHER): Payer: BC Managed Care – PPO | Admitting: Obstetrics & Gynecology

## 2014-07-21 ENCOUNTER — Encounter: Payer: Self-pay | Admitting: Obstetrics & Gynecology

## 2014-07-21 VITALS — BP 116/80 | HR 64 | Resp 16 | Ht 63.5 in | Wt 145.0 lb

## 2014-07-21 DIAGNOSIS — Z01419 Encounter for gynecological examination (general) (routine) without abnormal findings: Secondary | ICD-10-CM

## 2014-07-21 DIAGNOSIS — Z202 Contact with and (suspected) exposure to infections with a predominantly sexual mode of transmission: Secondary | ICD-10-CM

## 2014-07-21 DIAGNOSIS — N898 Other specified noninflammatory disorders of vagina: Secondary | ICD-10-CM

## 2014-07-21 DIAGNOSIS — Z8049 Family history of malignant neoplasm of other genital organs: Secondary | ICD-10-CM

## 2014-07-21 DIAGNOSIS — Z124 Encounter for screening for malignant neoplasm of cervix: Secondary | ICD-10-CM

## 2014-07-21 MED ORDER — NORETHIN ACE-ETH ESTRAD-FE 1-20 MG-MCG PO TABS: 1.0000 | ORAL_TABLET | Freq: Every day | ORAL | Status: DC

## 2014-07-21 NOTE — Progress Notes (Addendum)
34 y.o. G0P0000 SingleCaucasianF here for annual exam.  Moved to [facility] in July 2015 from [place].  Lived here in the past.  On OCPs for contraception.    Strong family hx of breast cancer.  Has had at least 5 core biopsies and one excisional biopsy.  Has done genetic testing.  Doing yearly MMG and yearly MRI and doing this on alternating six month rotation.    Patient's last menstrual period was 07/12/2014 (exact date).          Sexually active: Yes.    The current method of family planning is oral OCPs.    Exercising: No.  not regularly Smoker:  no  Health Maintenance: Pap:  3/15-normal in [place] History of abnormal Pap:  no MMG:  04/19/14-normal, 08/27/13-MRI-having MMG alternating with MRI every 6 months Colonoscopy:  none BMD:   none TDaP:  4/16 Screening Labs: PCP, Hb today: PCP, Urine today: PCP   reports that she has never smoked. She has never used smokeless tobacco. She reports that she drinks about 1.8 oz of alcohol per week. She reports that she does not use illicit drugs.  Past Medical History  Diagnosis Date  . Allergy   . Depression   . Migraine   . UTI (lower urinary tract infection)   . Family history of malignant neoplasm of breast   . Family history of uterine cancer     Past Surgical History  Procedure Laterality Date  . Breast biopsy  2011    benign  . Tonsillectomy and adenoidectomy  1993    Current Outpatient Prescriptions  Medication Sig Dispense Refill  . cetirizine (ZYRTEC) 10 MG tablet Take 10 mg by mouth daily.    Marland Kitchen MICROGESTIN FE 1/20 1-20 MG-MCG tablet Take 1 tablet by mouth daily. 1 Package 11  . OnabotulinumtoxinA (BOTOX IJ) Inject as directed. Every 3 months    . SUMAtriptan (IMITREX) 100 MG tablet Take 100 mg by mouth every 2 (two) hours as needed for migraine or headache. May repeat in 2 hours if headache persists or recurs.     No current facility-administered medications for this visit.    Family History  Problem Relation  Age of Onset  . Cancer Mother     uterine @ 72, breast @ 16, and melanoma @ 62  . Hypertension Mother   . Hyperlipidemia Father   . Hypertension Father   . Diabetes Father   . Cancer Maternal Grandfather     lung; smoker; deceased 63  . Cancer Paternal Grandmother 59    colon cancer; deceased 65  . Alcohol abuse Paternal Grandfather   . Cancer Maternal Aunt     skin & colon polyps (#/type?)  . Cancer Maternal Uncle     skin  . Cancer Paternal Aunt 63    breast; currently 61  . Migraines Other     paternal side  . Asthma Mother   . Arrhythmia Mother   . Heart block Father     had angioplasty    ROS:  Pertinent items are noted in HPI.  Otherwise, a comprehensive ROS was negative.  Exam:   BP 116/80 mmHg  Pulse 64  Resp 16  Ht 5' 3.5" (1.613 m)  Wt 145 lb (65.772 kg)  BMI 25.28 kg/m2  LMP 07/12/2014 (Exact Date)   Height: 5' 3.5" (161.3 cm)  Ht Readings from Last 3 Encounters:  07/21/14 5' 3.5" (1.613 m)  06/14/14 5' 3.5" (1.613 m)  04/26/14 5\' 3"  (1.6 m)  General appearance: alert, cooperative and appears stated age Head: Normocephalic, without obvious abnormality, atraumatic Neck: no adenopathy, supple, symmetrical, trachea midline and thyroid normal to inspection and palpation Lungs: clear to auscultation bilaterally Breasts: normal appearance, no masses or tenderness Heart: regular rate and rhythm Abdomen: soft, non-tender; bowel sounds normal; no masses,  no organomegaly Extremities: extremities normal, atraumatic, no cyanosis or edema Skin: Skin color, texture, turgor normal. No rashes or lesions Lymph nodes: Cervical, supraclavicular, and axillary nodes normal. 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                Cervix: no lesions              Pap taken: Yes.   Bimanual Exam:  Uterus:  normal size, contour, position, consistency, mobility, non-tender              Adnexa: normal adnexa and no mass, fullness, tenderness               Rectovaginal: Confirms               Anus:  normal sphincter tone, no lesions  Recommended vaginal biopsy.  Risks and benefits discussed briefly.  Verbal consent obtained.  Biopsy obtained using sterile technique with Kevorkian biopsy forceps.  Monsels applied for excellent hemostasis.  Pt tolerated procedure well.    Chaperone was present for exam.  A:  Well Woman with normal exam Strong family hx of breast, uterine (mother diagnosed age 81), and melanoma Paternal family hx of colon cancer--Paternal GM Has received Gardisil vaccination Vaginal lesions consistent with condyloma Vaginal discharge  P:   Mammogram and MRI yearly, alternating every six months Plan PUS this year after a cycle to evaluate the endometrium.  D/W pt possibility of yearly endometrial biopsies.  D/w pt pros/cons of doing this.  As doesn't have defined risk, feel should start with PUS this year and evaluated yearly for changes/reasons to repeat PUS.  Pt knows I will have a very low threshold for doing an endometrial biopsy on her. pap smear with HR HPV.  D/W pt doing yearly pap smears given the family hx with her mother Microgestin 1/20 Fe rx to pharmacy.  D/W usefulness of Mirena IUD as well. Gc/Chl testing done Vaginal biopsy pending   return annually or prn

## 2014-07-22 ENCOUNTER — Telehealth: Payer: Self-pay | Admitting: Obstetrics & Gynecology

## 2014-07-22 NOTE — Telephone Encounter (Signed)
Patient returned call. I advised of benefit quote received for PUS. °Patient agreeable. Scheduled PUS. °Advised patient of 72 hour cancellation policy and $100 cancellation fee. Patient agreeable. °

## 2014-07-22 NOTE — Telephone Encounter (Signed)
Left message for patient to call back. Need to go over benefits and schedule PUS °

## 2014-07-25 LAB — IPS N GONORRHOEA AND CHLAMYDIA BY PCR

## 2014-07-26 LAB — IPS PAP TEST WITH HPV

## 2014-07-27 ENCOUNTER — Encounter: Payer: Self-pay | Admitting: Obstetrics & Gynecology

## 2014-08-18 ENCOUNTER — Ambulatory Visit (INDEPENDENT_AMBULATORY_CARE_PROVIDER_SITE_OTHER): Payer: BC Managed Care – PPO | Admitting: Obstetrics & Gynecology

## 2014-08-18 ENCOUNTER — Ambulatory Visit (INDEPENDENT_AMBULATORY_CARE_PROVIDER_SITE_OTHER): Payer: BC Managed Care – PPO

## 2014-08-18 VITALS — BP 110/72 | Resp 20 | Ht 63.5 in | Wt 152.0 lb

## 2014-08-18 DIAGNOSIS — N841 Polyp of cervix uteri: Secondary | ICD-10-CM | POA: Diagnosis not present

## 2014-08-18 DIAGNOSIS — Z8049 Family history of malignant neoplasm of other genital organs: Secondary | ICD-10-CM | POA: Diagnosis not present

## 2014-08-18 DIAGNOSIS — Z803 Family history of malignant neoplasm of breast: Secondary | ICD-10-CM | POA: Diagnosis not present

## 2014-08-18 NOTE — Progress Notes (Signed)
34 y.o. G0 Singlefemale here for a pelvic ultrasound due to strong family hx of breast cancer and endometrial cancer.  The latter was in her mother and diagnosed at age 50.  Pt's cycles normal and on OCPs.  Due to this family hx, she and I have decided to proceed with yearly PUS to assess endometrial lining.  Pt has done genetic testing which is negative and is alternating MMG and MRI testing every six months.  She is being followed by Dr. Jana Hakim for this.     Patient's last menstrual period was 07/12/2014 (exact date).  Sexually active:  yes  Contraception: microgestin (OCP)  FINDINGS: UTERUS: 8.1 x 5.1 x 3.9cm with possible 4 x 65mm endocervical polyp 1.6cm from external os EMS: 3.41mm ADNEXA:   Left ovary 2.5 x 1.7 x 1.2cm with 1.1cm simple appearing follicle   Right ovary 2.8 x 2.3 x 1.5cm with 1.2cm simple appearing follicle CUL DE SAC: no free fluid seen  D/w pt findings.  I really do feel there is a small endocervical polyp noted but Pap smear with normal and she is having no abnormal bleeding.  She knows to call if this occurs.  Endometrium was 3.8mm so do not feel biopsy indicated today.  All questions answered.    Assessment:  Strong family hx of endometrial ca in her mother, diagnosed age 54 Small 4 x 43mm endocervical polyp Strong family hx of breast cancer with pt having had several biopsies already  Plan: AEX 1 year.  Will repeat Pap yearly.  Repeat PUS in one year.  Pt aware to call with any irregular bleeding/spotting.    ~15 minutes spent with patient >50% of time was in face to face discussion of above.

## 2014-08-26 ENCOUNTER — Encounter: Payer: Self-pay | Admitting: Obstetrics & Gynecology

## 2014-08-26 DIAGNOSIS — N841 Polyp of cervix uteri: Secondary | ICD-10-CM | POA: Insufficient documentation

## 2014-09-12 ENCOUNTER — Ambulatory Visit
Admission: RE | Admit: 2014-09-12 | Discharge: 2014-09-12 | Disposition: A | Discharge status: Home / Self Care | Payer: BC Managed Care – PPO | Source: Ambulatory Visit | Attending: Oncology | Admitting: Oncology

## 2014-09-12 ENCOUNTER — Other Ambulatory Visit: Payer: Self-pay

## 2014-09-12 DIAGNOSIS — Z1239 Encounter for other screening for malignant neoplasm of breast: Secondary | ICD-10-CM

## 2014-09-12 MED ORDER — GADOBENATE DIMEGLUMINE 529 MG/ML IV SOLN
14.0000 mL | Freq: Once | INTRAVENOUS | Status: AC | PRN
  Administered 2014-09-12: 14 mL via INTRAVENOUS

## 2014-10-04 ENCOUNTER — Other Ambulatory Visit (HOSPITAL_BASED_OUTPATIENT_CLINIC_OR_DEPARTMENT_OTHER): Payer: BC Managed Care – PPO

## 2014-10-04 ENCOUNTER — Other Ambulatory Visit: Payer: Self-pay

## 2014-10-04 ENCOUNTER — Telehealth: Payer: Self-pay | Admitting: Oncology

## 2014-10-04 ENCOUNTER — Ambulatory Visit (HOSPITAL_BASED_OUTPATIENT_CLINIC_OR_DEPARTMENT_OTHER): Payer: BC Managed Care – PPO | Admitting: Oncology

## 2014-10-04 VITALS — BP 120/80 | HR 77 | Temp 98.5°F | Resp 18 | Ht 63.5 in | Wt 147.6 lb

## 2014-10-04 DIAGNOSIS — Z1239 Encounter for other screening for malignant neoplasm of breast: Secondary | ICD-10-CM

## 2014-10-04 DIAGNOSIS — Z1501 Genetic susceptibility to malignant neoplasm of breast: Secondary | ICD-10-CM | POA: Diagnosis not present

## 2014-10-04 DIAGNOSIS — Z1509 Genetic susceptibility to other malignant neoplasm: Secondary | ICD-10-CM

## 2014-10-04 LAB — CBC WITH DIFFERENTIAL/PLATELET
BASO%: 0.6 % (ref 0.0–2.0)
Basophils Absolute: 0 10*3/uL (ref 0.0–0.1)
EOS%: 2 % (ref 0.0–7.0)
Eosinophils Absolute: 0.1 10*3/uL (ref 0.0–0.5)
HCT: 42 % (ref 34.8–46.6)
HGB: 14 g/dL (ref 11.6–15.9)
LYMPH%: 44.9 % (ref 14.0–49.7)
MCH: 29.2 pg (ref 25.1–34.0)
MCHC: 33.4 g/dL (ref 31.5–36.0)
MCV: 87.6 fL (ref 79.5–101.0)
MONO#: 0.4 10*3/uL (ref 0.1–0.9)
MONO%: 8 % (ref 0.0–14.0)
NEUT#: 2.2 10*3/uL (ref 1.5–6.5)
NEUT%: 44.5 % (ref 38.4–76.8)
Platelets: 218 10*3/uL (ref 145–400)
RBC: 4.79 10*6/uL (ref 3.70–5.45)
RDW: 12.7 % (ref 11.2–14.5)
WBC: 5 10*3/uL (ref 3.9–10.3)
lymph#: 2.2 10*3/uL (ref 0.9–3.3)

## 2014-10-04 LAB — COMPREHENSIVE METABOLIC PANEL (CC13)
ALT: 10 U/L (ref 0–55)
AST: 14 U/L (ref 5–34)
Albumin: 3.9 g/dL (ref 3.5–5.0)
Alkaline Phosphatase: 47 U/L (ref 40–150)
Anion Gap: 6 mEq/L (ref 3–11)
BUN: 5.3 mg/dL — ABNORMAL LOW (ref 7.0–26.0)
CO2: 27 mEq/L (ref 22–29)
Calcium: 9.2 mg/dL (ref 8.4–10.4)
Chloride: 110 mEq/L — ABNORMAL HIGH (ref 98–109)
Creatinine: 0.8 mg/dL (ref 0.6–1.1)
EGFR: >90 mL/min/{1.73_m2} (ref >90)
Glucose: 85 mg/dl (ref 70–140)
Potassium: 3.8 mEq/L (ref 3.5–5.1)
Sodium: 142 mEq/L (ref 136–145)
Total Bilirubin: 0.38 mg/dL (ref 0.20–1.20)
Total Protein: 6.6 g/dL (ref 6.4–8.3)

## 2014-10-04 NOTE — Telephone Encounter (Signed)
Gave avs & calendar for August 2017 °

## 2014-10-04 NOTE — Progress Notes (Signed)
[facility]  [phone] [phone]) [phone]     ID: Alejandrina Raimer DOB: [birth year]  MR#: [id number]  MNO#:[id number]  Patient Care Team: Abner Greenspan, MD as PCP - General (Family Medicine) PCP: Loura Pardon, MD GYN: Felipa Emory MD SU:  OTHER MD: Rolm Bookbinder MD  CHIEF COMPLAINT: high risk family history  CURRENT TREATMENT: screening   HISTORY OF PRESENT ILLNESS: From the original intake note:  Iliyah moved to this in the area July 2015, from [place]. She had previously lived here. In [place] she was followed by oncology, dermatology, and gynecology for a family history strongly suggestive of a genetic mutation putting her at increased risk for melanoma, endometrial and breast cancers (her mother had all three cancers at an early age). She did meet with genetic counseling there and on 05/07/2010 was tested for the BRCA1 and BRCA2 genes, with no mutation detected. A broader panel or BART testing were not performed.  She now presents to our high Risk clinic for futher evaluation and follow-up  INTERVAL HISTORY: Snigdha returns today to review results of her screening studies. Her MRI of the breast is negative. She also met with Dr. Sabra Heck, was evaluated, and plans to have yearly transvaginal ultrasounds as part of her workup. Biopsy of a suspicious area in the vaginal wall was negative. Camella also met with dermatology and was told that every 2 year visits would be adequate.  REVIEW OF SYSTEMS: A detailed review of systems today was otherwise negative  PAST MEDICAL HISTORY: Past Medical History  Diagnosis Date  . Allergy   . Depression   . Migraine   . UTI (lower urinary tract infection)   . Family history of malignant neoplasm of breast   . Family history of uterine cancer     PAST SURGICAL HISTORY: Past Surgical History  Procedure Laterality Date  . Breast biopsy  2011    benign  . Tonsillectomy and adenoidectomy  1993     FAMILY HISTORY Family History  Problem Relation Age of Onset  . Cancer Mother     uterine @ 96, breast @ 49, and melanoma @ 60  . Hypertension Mother   . Hyperlipidemia Father   . Hypertension Father   . Diabetes Father   . Cancer Maternal Grandfather     lung; smoker; deceased 2  . Cancer Paternal Grandmother 35    colon cancer; deceased 33  . Alcohol abuse Paternal Grandfather   . Cancer Maternal Aunt     skin & colon polyps (#/type?)  . Cancer Maternal Uncle     skin  . Cancer Paternal Aunt 48    breast; currently 69  . Migraines Other     paternal side  . Asthma Mother   . Arrhythmia Mother   . Heart block Father     had angioplasty   the patient's mother was diagnosed with cancer of the uterus at age 53, cancer of the breast at age 85, and melanoma at age 2. This may well represent a p53 mutation, but there are many other possibilities. Unfortunately the patient's mother, who is still living, refuses to be genetically tested. The patient's maternal grandfather had a history of brain and lung cancer (these apparently were 2 separate primaries). He was a smoker. He died at age 64. 42 of the patient's mother's siblings had skin cancers, but not melanomas. On the paternal side the paternal grandmother was diagnosed with colon cancer at age 65 and a paternal aunt was  diagnosed with breast cancer at age 80. The patient's father has a history of precancerous polyps.  GYNECOLOGIC HISTORY:  No LMP recorded. Menarche age 61. The patient is GX P0. She understands that waiting after age 82 to carry a child essentially doubles the risk of breast cancer.  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 She also has a second job at [employer]. She is single, and lives alone, with no pets    ADVANCED DIRECTIVES: Not in place. At her 04/26/2014 visit she was given the appropriate documents 2 complete and notarize our her  discretion.   HEALTH MAINTENANCE: History  Substance Use Topics  . Smoking status: Never Smoker   . Smokeless tobacco: Never Used  . Alcohol Use: 1.8 oz/week    3 Standard drinks or equivalent per week     Colonoscopy: Never  PAP: March 2015  Bone density: Never  Lipid panel:  Allergies  Allergen Reactions  . Codone [Hydrocodone]   . Penicillins   . Sulfur     Current Outpatient Prescriptions  Medication Sig Dispense Refill  . cetirizine (ZYRTEC) 10 MG tablet Take 10 mg by mouth daily.    . norethindrone-ethinyl estradiol (MICROGESTIN FE 1/20) 1-20 MG-MCG tablet Take 1 tablet by mouth daily. 3 Package 4  . OnabotulinumtoxinA (BOTOX IJ) Inject as directed. Every 3 months    . SUMAtriptan (IMITREX) 100 MG tablet Take 100 mg by mouth every 2 (two) hours as needed for migraine or headache. May repeat in 2 hours if headache persists or recurs.     No current facility-administered medications for this visit.    OBJECTIVE: Young white woman in no acute distress Filed Vitals:   10/04/14 1154  BP: 120/80  Pulse: 77  Temp: 98.5 F (36.9 C)  Resp: 18     Body mass index is 25.73 kg/(m^2).    ECOG FS:0 - Asymptomatic  Sclerae unicteric, pupils round and equal Oropharynx clear and moist-- no thrush or other lesions No cervical or supraclavicular adenopathy Lungs no rales or rhonchi Heart regular rate and rhythm Abd soft, nontender, positive bowel sounds MSK no focal spinal tenderness, no upper extremity lymphedema Neuro: nonfocal, well oriented, appropriate affect Breasts: Deferred   LAB RESULTS:  CMP     Component Value Date/Time   NA 136 06/09/2014 1634   NA 143 04/26/2014 1547   K 3.9 06/09/2014 1634   K 4.3 04/26/2014 1547   CL 102 06/09/2014 1634   CO2 28 06/09/2014 1634   CO2 26 04/26/2014 1547   GLUCOSE 86 06/09/2014 1634   GLUCOSE 98 04/26/2014 1547   BUN 9 06/09/2014 1634   BUN 10.7 04/26/2014 1547   CREATININE 0.67 06/09/2014 1634   CREATININE 0.8  04/26/2014 1547   CALCIUM 9.2 06/09/2014 1634   CALCIUM 9.1 04/26/2014 1547   PROT 6.7 06/09/2014 1634   PROT 6.8 04/26/2014 1547   ALBUMIN 4.0 06/09/2014 1634   ALBUMIN 4.1 04/26/2014 1547   AST 14 06/09/2014 1634   AST 13 04/26/2014 1547   ALT 9 06/09/2014 1634   ALT 10 04/26/2014 1547   ALKPHOS 42 06/09/2014 1634   ALKPHOS 48 04/26/2014 1547   BILITOT 0.3 06/09/2014 1634   BILITOT 0.36 04/26/2014 1547    INo results found for: SPEP, UPEP  Lab Results  Component Value Date   WBC 5.0 10/04/2014   NEUTROABS 2.2 10/04/2014   HGB 14.0 10/04/2014   HCT 42.0 10/04/2014  MCV 87.6 10/04/2014   PLT 218 10/04/2014      Chemistry      Component Value Date/Time   NA 136 06/09/2014 1634   NA 143 04/26/2014 1547   K 3.9 06/09/2014 1634   K 4.3 04/26/2014 1547   CL 102 06/09/2014 1634   CO2 28 06/09/2014 1634   CO2 26 04/26/2014 1547   BUN 9 06/09/2014 1634   BUN 10.7 04/26/2014 1547   CREATININE 0.67 06/09/2014 1634   CREATININE 0.8 04/26/2014 1547      Component Value Date/Time   CALCIUM 9.2 06/09/2014 1634   CALCIUM 9.1 04/26/2014 1547   ALKPHOS 42 06/09/2014 1634   ALKPHOS 48 04/26/2014 1547   AST 14 06/09/2014 1634   AST 13 04/26/2014 1547   ALT 9 06/09/2014 1634   ALT 10 04/26/2014 1547   BILITOT 0.3 06/09/2014 1634   BILITOT 0.36 04/26/2014 1547       No results found for: LABCA2  No components found for: LABCA125  No results for input(s): INR in the last 168 hours.  Urinalysis    Component Value Date/Time   BILIRUBINUR negative 06/14/2014 1630   PROTEINUR negative 06/14/2014 1630   UROBILINOGEN 0.2 06/14/2014 1630   NITRITE negative 06/14/2014 1630   LEUKOCYTESUR large (3+) 06/14/2014 1630    STUDIES: Mr Breast Bilateral W Wo Contrast  10/03/2014   CLINICAL DATA:  34 year old female with high risk. Patient's mother an aunt have a history of breast cancer. The patient's mother was diagnosed with breast cancer at the age of 47 for. The patient's  IBIS was previously calculated at 26.4%.  LABS:  None obtained at the time of imaging.  EXAM: BILATERAL BREAST MRI WITH AND WITHOUT CONTRAST  TECHNIQUE: Multiplanar, multisequence MR images of both breasts were obtained prior to and following the intravenous administration of 14 ml of MultiHance.  THREE-DIMENSIONAL MR IMAGE RENDERING ON INDEPENDENT WORKSTATION:  Three-dimensional MR images were rendered by post-processing of the original MR data on an independent workstation. The three-dimensional MR images were interpreted, and findings are reported in the following complete MRI report for this study. Three dimensional images were evaluated at the independent DynaCad workstation  COMPARISON:  Previous exam(s).  FINDINGS: Breast composition: c. Heterogeneous fibroglandular tissue.  Background parenchymal enhancement: Moderate.  Right breast: No mass or abnormal enhancement.  Left breast: No mass or abnormal enhancement.  Lymph nodes: No abnormal appearing lymph nodes.  Ancillary findings:  None.  IMPRESSION: Negative breast MRI.  RECOMMENDATION: Screening mammogram in one year.(Code:[id number]-B-01Y)  The American Cancer Society recommends annual MRI and mammography in patients with an estimated lifetime risk of developing breast cancer greater than 20 - 25%, or who are known or suspected to be positive for the breast cancer gene.  BI-RADS CATEGORY  1: Negative.   Electronically Signed   By: Lillia Mountain M.D.   On: 10/03/2014 10:38    Accession: [id number] Received: 07/21/2014 M. Edwinna Areola, MD DOB: [birth year] Age: 62 Gender: F Reported: 08/02/2014 [address] Patient Ph: [phone] MRN #: [id number] [place], [place] [address] Client Acc#: Chart #: [id number] Phone: Fax: CC: REPORT OF SURGICAL PATHOLOGY FINAL DIAGNOSIS Diagnosis Vagina, biopsy, near 11 o'clock on cervix, vaginal sidewall - INFLAMED GRANULATION TISSUE WITH RARE DETACHED DEGENERATING BENIGN EPITHELIAL CELLS. - NO CONDYLOMATOUS  EPITHELIUM IDENTIFIED. - NEGATIVE FOR MALIGNANCY. Haywood Lasso MD Pathologist, Electronic Signature (Case signed 08/02/2014)  ASSESSMENT: 34 y.o. [place] woman with a high-risk family history for breast, uterine, melanoma and  colon cancers  (1) BRCA 1 and 2 testing 05/07/2010 negative (BARTs not sent)  (2) genetics testing 05/05/2014 through the [facility] gene panel at [facility] showed no deleterious mutations in ATM, BARD1, BRCA1, BRCA2, BRIP1, CDH1, CHEK2, MRE11A, MUTYH, NBN, NF1, PALB2, PTEN, RAD50, RAD51C, RAD51D, and TP53  (3) screening plan:  (a) yearly gynecology visits  (b) yearly breast MRI in addition to mammography  (c) biannual dermatology screening  PLAN: Naturi has set herself up for routine screening as appropriate. The big concern of courses cost. She had to pay for her breast MRI because her insurance could not find her number. Hopefully that will be cleared up. She has a deductible of $1000 after which she has to pay 20%.  We reviewed her genetics results, which are reassuring, but which did not negate the fact that she still has a very high risk of developing breast cancer, certainly over 22%/[id number]  I think a reasonable plan for her would be to proceed to mammography in January as usual, then see me in August. At that time we will consider repeat MRI. She will follow up with Dr. Sabra Heck and with dermatology at their discretion and of course also with Dr. Glori Bickers as needed. Floy has a good understanding of this plan. She agrees with it. She knows to call for any problems that may develop before her next visit here.  Chauncey Cruel, MD   10/04/2014 12:11 PM Medical Oncology and Hematology [facility] [address] [place], [place] [address] Tel. [phone]    Fax. [phone]

## 2014-10-31 ENCOUNTER — Other Ambulatory Visit: Payer: BC Managed Care – PPO

## 2014-10-31 ENCOUNTER — Ambulatory Visit: Payer: BC Managed Care – PPO | Admitting: Oncology

## 2014-11-29 ENCOUNTER — Ambulatory Visit: Payer: BC Managed Care – PPO

## 2015-02-23 ENCOUNTER — Encounter: Payer: Self-pay | Admitting: Primary Care

## 2015-02-23 ENCOUNTER — Ambulatory Visit (INDEPENDENT_AMBULATORY_CARE_PROVIDER_SITE_OTHER): Payer: BC Managed Care – PPO | Admitting: Primary Care

## 2015-02-23 VITALS — BP 118/80 | HR 78 | Temp 98.0°F | Ht 63.5 in | Wt 152.0 lb

## 2015-02-23 DIAGNOSIS — R059 Cough, unspecified: Secondary | ICD-10-CM

## 2015-02-23 DIAGNOSIS — R05 Cough: Secondary | ICD-10-CM

## 2015-02-23 MED ORDER — BENZONATATE 200 MG PO CAPS: 200.0000 mg | ORAL_CAPSULE | Freq: Three times a day (TID) | ORAL | Status: DC | PRN

## 2015-02-23 MED ORDER — AZITHROMYCIN 250 MG PO TABS: ORAL_TABLET | ORAL | Status: DC

## 2015-02-23 NOTE — Progress Notes (Signed)
Subjective:    Patient ID: Kirsten Wilson, female    DOB: 04/29/1980, 34 y.o.   MRN: [id number]  HPI  Kirsten Wilson is a 34 year old female who presents today with a chief complaint fo cough. She also reports chills, nasal congestion, sore throat, headache. Her cough has been present for for 2 weeks. She's been using her albuterol inhaler and Mucinex without improvement. Her cough is non productive. Overall she's feeling worse.   Review of Systems  Constitutional: Positive for chills and fatigue. Negative for fever.  HENT: Positive for congestion, sinus pressure and sore throat. Negative for ear pain.   Respiratory: Positive for cough. Negative for shortness of breath.   Neurological: Positive for headaches.       Past Medical History  Diagnosis Date  . Allergy   . Depression   . Migraine   . UTI (lower urinary tract infection)   . Family history of malignant neoplasm of breast   . Family history of uterine cancer     Social History   Social History  . Marital Status: Single    Spouse Name: N/A  . Number of Children: N/A  . Years of Education: N/A   Occupational History  . Not on file.   Social History Main Topics  . Smoking status: Never Smoker   . Smokeless tobacco: Never Used  . Alcohol Use: 1.8 oz/week    3 Standard drinks or equivalent per week  . Drug Use: No  . Sexual Activity:    Partners: Male    Birth Control/ Protection: Pill     Comment: Microgestin   Other Topics Concern  . Not on file   Social History Narrative    Past Surgical History  Procedure Laterality Date  . Breast biopsy  2011    benign  . Tonsillectomy and adenoidectomy  1993    Family History  Problem Relation Age of Onset  . Cancer Mother     uterine @ 54, breast @ 17, and melanoma @ 56  . Hypertension Mother   . Hyperlipidemia Father   . Hypertension Father   . Diabetes Father   . Cancer Maternal Grandfather     lung; smoker; deceased 5  . Cancer Paternal Grandmother  30    colon cancer; deceased 77  . Alcohol abuse Paternal Grandfather   . Cancer Maternal Aunt     skin & colon polyps (#/type?)  . Cancer Maternal Uncle     skin  . Cancer Paternal Aunt 52    breast; currently 25  . Migraines Other     paternal side  . Asthma Mother   . Arrhythmia Mother   . Heart block Father     had angioplasty    Allergies  Allergen Reactions  . Codone [Hydrocodone]   . Penicillins   . Sulfur     Current Outpatient Prescriptions on File Prior to Visit  Medication Sig Dispense Refill  . cetirizine (ZYRTEC) 10 MG tablet Take 10 mg by mouth daily.    . norethindrone-ethinyl estradiol (MICROGESTIN FE 1/20) 1-20 MG-MCG tablet Take 1 tablet by mouth daily. 3 Package 4  . OnabotulinumtoxinA (BOTOX IJ) Inject as directed. Reported on 02/23/2015    . SUMAtriptan (IMITREX) 100 MG tablet Take 100 mg by mouth every 2 (two) hours as needed for migraine or headache. Reported on 02/23/2015     No current facility-administered medications on file prior to visit.    BP 118/80 mmHg  Pulse 78  Temp(Src) 98 F (36.7 C) (Oral)  Ht 5' 3.5" (1.613 m)  Wt 152 lb (68.947 kg)  BMI 26.50 kg/m2  LMP 02/20/2015    Objective:   Physical Exam  Constitutional: She appears well-nourished.  HENT:  Right Ear: Tympanic membrane and ear canal normal.  Left Ear: Tympanic membrane and ear canal normal.  Nose: Right sinus exhibits frontal sinus tenderness. Right sinus exhibits no maxillary sinus tenderness. Left sinus exhibits frontal sinus tenderness. Left sinus exhibits no maxillary sinus tenderness.  Mouth/Throat: Posterior oropharyngeal erythema present. No oropharyngeal exudate or posterior oropharyngeal edema.  Eyes: Conjunctivae are normal. Pupils are equal, round, and reactive to light.  Neck: Neck supple.  Cardiovascular: Normal rate and regular rhythm.   Pulmonary/Chest: Effort normal and breath sounds normal. She has no rales.  Lymphadenopathy:    She has no cervical  adenopathy.  Skin: Skin is warm and dry.          Assessment & Plan:  Cough:  Present for 2 weeks with sore throat, chest congestion, sore throat. No improvement with OTC meds, overall feeling worse. Exam mostly unremarkable except for presence of dry/hackey cough. Lungs clear. Due to duration, overall decline, and symptoms, will treat. Zpak and Tessalon pearls sent to pharmacy. Fluids, rest, follow up PRN.

## 2015-02-23 NOTE — Patient Instructions (Signed)
Start Azithromycin antibiotics. Take 2 tablets by mouth today, then 1 tablet daily for 4 additional days.  You may take Benzonatate capsules for cough. Take 1 capsule by mouth three times daily as needed for cough.  Increase fluids and rest.  It was a pleasure meeting you!  Acute Bronchitis Bronchitis is inflammation of the airways that extend from the windpipe into the lungs (bronchi). The inflammation often causes mucus to develop. This leads to a cough, which is the most common symptom of bronchitis.  In acute bronchitis, the condition usually develops suddenly and goes away over time, usually in a couple weeks. Smoking, allergies, and asthma can make bronchitis worse. Repeated episodes of bronchitis may cause further lung problems.  CAUSES Acute bronchitis is most often caused by the same virus that causes a cold. The virus can spread from person to person (contagious) through coughing, sneezing, and touching contaminated objects. SIGNS AND SYMPTOMS   Cough.   Fever.   Coughing up mucus.   Body aches.   Chest congestion.   Chills.   Shortness of breath.   Sore throat.  DIAGNOSIS  Acute bronchitis is usually diagnosed through a physical exam. Your health care provider will also ask you questions about your medical history. Tests, such as chest X-rays, are sometimes done to rule out other conditions.  TREATMENT  Acute bronchitis usually goes away in a couple weeks. Oftentimes, no medical treatment is necessary. Medicines are sometimes given for relief of fever or cough. Antibiotic medicines are usually not needed but may be prescribed in certain situations. In some cases, an inhaler may be recommended to help reduce shortness of breath and control the cough. A cool mist vaporizer may also be used to help thin bronchial secretions and make it easier to clear the chest.  HOME CARE INSTRUCTIONS  Get plenty of rest.   Drink enough fluids to keep your urine clear or pale  yellow (unless you have a medical condition that requires fluid restriction). Increasing fluids may help thin your respiratory secretions (sputum) and reduce chest congestion, and it will prevent dehydration.   Take medicines only as directed by your health care provider.  If you were prescribed an antibiotic medicine, finish it all even if you start to feel better.  Avoid smoking and secondhand smoke. Exposure to cigarette smoke or irritating chemicals will make bronchitis worse. If you are a smoker, consider using nicotine gum or skin patches to help control withdrawal symptoms. Quitting smoking will help your lungs heal faster.   Reduce the chances of another bout of acute bronchitis by washing your hands frequently, avoiding people with cold symptoms, and trying not to touch your hands to your mouth, nose, or eyes.   Keep all follow-up visits as directed by your health care provider.  SEEK MEDICAL CARE IF: Your symptoms do not improve after 1 week of treatment.  SEEK IMMEDIATE MEDICAL CARE IF:  You develop an increased fever or chills.   You have chest pain.   You have severe shortness of breath.  You have bloody sputum.   You develop dehydration.  You faint or repeatedly feel like you are going to pass out.  You develop repeated vomiting.  You develop a severe headache. MAKE SURE YOU:   Understand these instructions.  Will watch your condition.  Will get help right away if you are not doing well or get worse.   This information is not intended to replace advice given to you by your health care provider.  Make sure you discuss any questions you have with your health care provider.   Document Released: 03/28/2004 Document Revised: 03/11/2014 Document Reviewed: 08/11/2012 Elsevier Interactive Patient Education [employer].

## 2015-03-14 ENCOUNTER — Other Ambulatory Visit: Payer: Self-pay | Admitting: Oncology

## 2015-03-14 DIAGNOSIS — Z1231 Encounter for screening mammogram for malignant neoplasm of breast: Secondary | ICD-10-CM

## 2015-04-24 ENCOUNTER — Ambulatory Visit: Payer: BC Managed Care – PPO

## 2015-04-28 ENCOUNTER — Ambulatory Visit
Admission: RE | Admit: 2015-04-28 | Discharge: 2015-04-28 | Disposition: A | Discharge status: Home / Self Care | Payer: BC Managed Care – PPO | Source: Ambulatory Visit | Attending: Oncology | Admitting: Oncology

## 2015-04-28 DIAGNOSIS — Z1231 Encounter for screening mammogram for malignant neoplasm of breast: Secondary | ICD-10-CM

## 2015-06-02 ENCOUNTER — Encounter: Payer: Self-pay | Admitting: Primary Care

## 2015-07-05 ENCOUNTER — Telehealth: Payer: Self-pay | Admitting: Obstetrics & Gynecology

## 2015-07-05 NOTE — Telephone Encounter (Signed)
Called patient to reschedule appt for 07/05/15 due to Dr. Sabra Heck being out of the office.

## 2015-08-14 ENCOUNTER — Encounter: Payer: Self-pay | Admitting: Obstetrics & Gynecology

## 2015-08-14 ENCOUNTER — Ambulatory Visit (INDEPENDENT_AMBULATORY_CARE_PROVIDER_SITE_OTHER): Payer: BC Managed Care – PPO | Admitting: Obstetrics & Gynecology

## 2015-08-14 VITALS — BP 120/80 | HR 84 | Resp 16 | Ht 63.0 in | Wt 156.8 lb

## 2015-08-14 DIAGNOSIS — Z124 Encounter for screening for malignant neoplasm of cervix: Secondary | ICD-10-CM

## 2015-08-14 DIAGNOSIS — Z8049 Family history of malignant neoplasm of other genital organs: Secondary | ICD-10-CM

## 2015-08-14 DIAGNOSIS — Z01419 Encounter for gynecological examination (general) (routine) without abnormal findings: Secondary | ICD-10-CM | POA: Diagnosis not present

## 2015-08-14 MED ORDER — NORETHIN ACE-ETH ESTRAD-FE 1-20 MG-MCG PO TABS: 1.0000 | ORAL_TABLET | Freq: Every day | ORAL | Status: DC

## 2015-08-14 NOTE — Progress Notes (Signed)
35 y.o. G0P0000 SingleCaucasianF here for annual exam.  Doing well.  Pt is seeing Dr. Jana Hakim in August.  Had breast MRI last year.  Insurance didn't cover it at all.  MMG was 2/17 so will discuss doing the MRI with that appt.  Pt would like to keep doing an every six month examination of her breasts.  Did do expanded genetic testing last year 3/16.  This was negative.  Had prior BRCA 1/2 in 20112.    Patient's last menstrual period was 08/08/2015.          Sexually active: Yes.    The current method of family planning is OCP (estrogen/progesterone).    Exercising: No.  The patient does not participate in regular exercise at present. Smoker:  no  Health Maintenance: Pap:  07/21/14-WNL History of abnormal Pap:  no MMG:  04/28/15-BIRADS 1 Colonoscopy:  Never BMD:   Never TDaP:  06/14/14 Pneumonia vaccine(s):  No Zostavax:   No Hep C testing:  Not indicated Screening Labs: No, Hb today: No, Urine today: No   reports that she has never smoked. She has never used smokeless tobacco. She reports that she drinks about 1.8 oz of alcohol per week. She reports that she does not use illicit drugs.  Past Medical History  Diagnosis Date  . Allergy   . Depression   . Migraine   . UTI (lower urinary tract infection)   . Family history of malignant neoplasm of breast   . Family history of uterine cancer     Past Surgical History  Procedure Laterality Date  . Breast biopsy  2011    benign  . Tonsillectomy and adenoidectomy  1993    Current Outpatient Prescriptions  Medication Sig Dispense Refill  . cetirizine (ZYRTEC) 10 MG tablet Take 10 mg by mouth daily.    . norethindrone-ethinyl estradiol (MICROGESTIN FE 1/20) 1-20 MG-MCG tablet Take 1 tablet by mouth daily. 3 Package 4  . OnabotulinumtoxinA (BOTOX IJ) Inject as directed. Reported on 02/23/2015    . SUMAtriptan (IMITREX) 100 MG tablet Take 100 mg by mouth every 2 (two) hours as needed for migraine or headache. Reported on 02/23/2015      No current facility-administered medications for this visit.    Family History  Problem Relation Age of Onset  . Cancer Mother     uterine @ 53, breast @ 21, and melanoma @ 52  . Hypertension Mother   . Hyperlipidemia Father   . Hypertension Father   . Diabetes Father   . Cancer Maternal Grandfather     lung; smoker; deceased 43  . Cancer Paternal Grandmother 47    colon cancer; deceased 70  . Alcohol abuse Paternal Grandfather   . Cancer Maternal Aunt     skin & colon polyps (#/type?)  . Cancer Maternal Uncle     skin  . Cancer Paternal Aunt 70    breast; currently 19  . Migraines Other     paternal side  . Asthma Mother   . Arrhythmia Mother   . Heart block Father     had angioplasty    ROS:  Pertinent items are noted in HPI.  Otherwise, a comprehensive ROS was negative.  Exam:   BP 120/80 mmHg  Pulse 84  Resp 16  Ht _0  (1.6 m)  Wt 156 lb 12.8 oz (71.124 kg)  BMI 27.78 kg/m2  LMP 08/08/2015  Weight change: -4#  Height: _1  (160 cm)  Ht Readings from Last 3  Encounters:  08/14/15 _0  (1.6 m)  02/23/15 5' 3.5" (1.613 m)  10/04/14 5' 3.5" (1.613 m)    General appearance: alert, cooperative and appears stated age Head: Normocephalic, without obvious abnormality, atraumatic Neck: no adenopathy, supple, symmetrical, trachea midline and thyroid normal to inspection and palpation Lungs: clear to auscultation bilaterally Breasts: normal appearance, no masses or tenderness Heart: regular rate and rhythm Abdomen: soft, non-tender; bowel sounds normal; no masses,  no organomegaly Extremities: extremities normal, atraumatic, no cyanosis or edema Skin: Skin color, texture, turgor normal. No rashes or lesions Lymph nodes: Cervical, supraclavicular, and axillary nodes normal. No abnormal inguinal nodes palpated Neurologic: Grossly normal   Pelvic: External genitalia:  no lesions              Urethra:  normal appearing urethra with no masses, tenderness or  lesions              Bartholins and Skenes: normal                 Vagina: normal appearing vagina with normal color and discharge, no lesions              Cervix: no lesions              Pap taken: Yes.   Bimanual Exam:  Uterus:  normal size, contour, position, consistency, mobility, non-tender              Adnexa: normal adnexa and no mass, fullness, tenderness               Rectovaginal: Confirms               Anus:  normal sphincter tone, no lesions  Chaperone was present for exam.  A:  Well Woman with normal exam Strong family hx of breast, uterine (mother diagnosed age 72), and melanoma Paternal family hx of colon cancer--Paternal GM Biopsy of vaginal lesion that was granulation tissue Declines STD testing today  P: Mammogram and MRI yearly, alternating every six months Repeat PUS this year.  Order placed. Pap smear with HR HPV. D/W pt doing yearly pap smears given the family hx with her mother Microgestin 1/20 Fe rx to pharmacy. Has declined progesterone IUD currently.  D/W pt possible decreased risks with endometrial cancer but she is comfortable on her OCPs. Return annually or prn

## 2015-08-15 ENCOUNTER — Telehealth: Payer: Self-pay | Admitting: Obstetrics & Gynecology

## 2015-08-15 NOTE — Telephone Encounter (Signed)
Return call to Suzy. °

## 2015-08-15 NOTE — Telephone Encounter (Signed)
Called patient to review benefits for a recommended procedure. Left Voicemail requesting a call back. °

## 2015-08-16 NOTE — Telephone Encounter (Signed)
Spoke with pt regarding ultrasound. Patient ready to schedule. Patient scheduled 08/31/15 with Dr Sabra Heck. Pt aware of arrival date and time. Pt aware of 72 hours cancellation policy. No further questions. Ok to close

## 2015-08-18 LAB — IPS PAP TEST WITH REFLEX TO HPV

## 2015-08-24 ENCOUNTER — Encounter: Payer: Self-pay | Admitting: Obstetrics & Gynecology

## 2015-08-25 NOTE — Telephone Encounter (Signed)
Late entry.  Called pt personally on 08/24/15 and explained her pap results--ASCUS pap with neg HR HPV.  Pt will return for AEX 1 year.  Will plan repeat Pap 1 year.  All questions answered.  Pt grateful for phone call.

## 2015-08-31 ENCOUNTER — Ambulatory Visit (INDEPENDENT_AMBULATORY_CARE_PROVIDER_SITE_OTHER): Payer: BC Managed Care – PPO | Admitting: Obstetrics & Gynecology

## 2015-08-31 ENCOUNTER — Encounter: Payer: Self-pay | Admitting: Obstetrics & Gynecology

## 2015-08-31 ENCOUNTER — Ambulatory Visit (INDEPENDENT_AMBULATORY_CARE_PROVIDER_SITE_OTHER): Payer: BC Managed Care – PPO

## 2015-08-31 VITALS — BP 118/80 | HR 64 | Resp 12 | Wt 156.0 lb

## 2015-08-31 DIAGNOSIS — Z8049 Family history of malignant neoplasm of other genital organs: Secondary | ICD-10-CM

## 2015-08-31 DIAGNOSIS — Q51810 Arcuate uterus: Secondary | ICD-10-CM | POA: Diagnosis not present

## 2015-08-31 NOTE — Progress Notes (Signed)
35 y.o. G0P0000 Single Caucasian female here for pelvic ultrasound due to strong family hx of uterine cancer in her mother who was diagnosed at age 56.  This is a confirmed diagnosis.  Pt has undergone negative genetic testing but is being followed by Dr. Jana Hakim as if she had + genetic testing.  PUS is being done yearly.  Pt has questions about her ASCUS pap with neg HR HPV.  These were all answered.  No changes with cycles.  Patient's last menstrual period was 08/08/2015.  Contraception: OCPs  Findings:  UTERUS: 7.6 x 5.0 x 3.9cm, arcuate shaped endometrial cavity EMS: 4.59mm ADNEXA: Left ovary:  2.4 x 1.0 x 0.8cm       Right ovary: 3.7 x 2.8 x 2.1cm with 1.6cm simple appearing ovarian follicle CUL DE SAC: no free fluid  Discussion:  Findings reviewed with pt.  Appearance of endometrial cavity and endometrial thickness reviewed.  Do not feel pt needs an endometrial biopsy at this time.  Pt comfortable with plan.  Knows to call with any changes to her bleeding.  All questions answered.  Assessment:  Strong family hx of breast and endometrial cancer  Plan:  Plan F/u 1 year.  Will repeat Pap one year for pt comfort.  PUS 1 year as well.  ~15 minutes spent with patient >50% of time was in face to face discussion of above.

## 2015-09-03 DIAGNOSIS — Q51810 Arcuate uterus: Secondary | ICD-10-CM | POA: Insufficient documentation

## 2015-09-06 ENCOUNTER — Other Ambulatory Visit: Payer: Self-pay | Admitting: Obstetrics & Gynecology

## 2015-09-25 ENCOUNTER — Telehealth: Payer: Self-pay | Admitting: Oncology

## 2015-09-25 NOTE — Telephone Encounter (Signed)
Called patient to confirm appointment. Left message. Appointment letter and schedule mailed. Kirsten F. °

## 2015-10-03 ENCOUNTER — Ambulatory Visit: Payer: BC Managed Care – PPO | Admitting: Oncology

## 2015-10-03 ENCOUNTER — Other Ambulatory Visit: Payer: BC Managed Care – PPO

## 2015-10-06 ENCOUNTER — Ambulatory Visit: Payer: BC Managed Care – PPO | Admitting: Obstetrics & Gynecology

## 2015-10-30 ENCOUNTER — Other Ambulatory Visit: Payer: BC Managed Care – PPO

## 2015-10-30 ENCOUNTER — Ambulatory Visit: Payer: Self-pay | Admitting: Oncology

## 2015-11-08 ENCOUNTER — Ambulatory Visit: Payer: Self-pay | Admitting: Oncology

## 2015-11-08 ENCOUNTER — Other Ambulatory Visit: Payer: BC Managed Care – PPO

## 2015-11-28 ENCOUNTER — Other Ambulatory Visit: Payer: Self-pay | Admitting: *Deleted

## 2015-11-28 DIAGNOSIS — Z8049 Family history of malignant neoplasm of other genital organs: Secondary | ICD-10-CM

## 2015-11-28 DIAGNOSIS — Z1239 Encounter for other screening for malignant neoplasm of breast: Secondary | ICD-10-CM

## 2015-11-28 DIAGNOSIS — Z803 Family history of malignant neoplasm of breast: Secondary | ICD-10-CM

## 2015-11-29 ENCOUNTER — Other Ambulatory Visit (HOSPITAL_BASED_OUTPATIENT_CLINIC_OR_DEPARTMENT_OTHER): Payer: BC Managed Care – PPO

## 2015-11-29 ENCOUNTER — Ambulatory Visit (HOSPITAL_BASED_OUTPATIENT_CLINIC_OR_DEPARTMENT_OTHER): Payer: BC Managed Care – PPO | Admitting: Oncology

## 2015-11-29 VITALS — BP 116/81 | HR 74 | Temp 98.3°F | Resp 18 | Wt 157.5 lb

## 2015-11-29 DIAGNOSIS — Z8049 Family history of malignant neoplasm of other genital organs: Secondary | ICD-10-CM

## 2015-11-29 DIAGNOSIS — Z808 Family history of malignant neoplasm of other organs or systems: Secondary | ICD-10-CM

## 2015-11-29 DIAGNOSIS — Z803 Family history of malignant neoplasm of breast: Secondary | ICD-10-CM | POA: Diagnosis not present

## 2015-11-29 DIAGNOSIS — Z1239 Encounter for other screening for malignant neoplasm of breast: Secondary | ICD-10-CM

## 2015-11-29 LAB — CBC WITH DIFFERENTIAL/PLATELET
BASO%: 0.8 % (ref 0.0–2.0)
Basophils Absolute: 0.1 10*3/uL (ref 0.0–0.1)
EOS%: 0.9 % (ref 0.0–7.0)
Eosinophils Absolute: 0.1 10*3/uL (ref 0.0–0.5)
HCT: 43.4 % (ref 34.8–46.6)
HGB: 14.4 g/dL (ref 11.6–15.9)
LYMPH%: 32.9 % (ref 14.0–49.7)
MCH: 28.9 pg (ref 25.1–34.0)
MCHC: 33.1 g/dL (ref 31.5–36.0)
MCV: 87.5 fL (ref 79.5–101.0)
MONO#: 0.5 10*3/uL (ref 0.1–0.9)
MONO%: 6.7 % (ref 0.0–14.0)
NEUT#: 4.3 10*3/uL (ref 1.5–6.5)
NEUT%: 58.7 % (ref 38.4–76.8)
Platelets: ADEQUATE 10*3/uL (ref 145–400)
RBC: 4.96 10*6/uL (ref 3.70–5.45)
RDW: 12.7 % (ref 11.2–14.5)
WBC: 7.3 10*3/uL (ref 3.9–10.3)
lymph#: 2.4 10*3/uL (ref 0.9–3.3)

## 2015-11-29 LAB — COMPREHENSIVE METABOLIC PANEL
ALT: 11 U/L (ref 0–55)
AST: 13 U/L (ref 5–34)
Albumin: 3.8 g/dL (ref 3.5–5.0)
Alkaline Phosphatase: 53 U/L (ref 40–150)
Anion Gap: 9 mEq/L (ref 3–11)
BUN: 11.5 mg/dL (ref 7.0–26.0)
CO2: 23 mEq/L (ref 22–29)
Calcium: 9.4 mg/dL (ref 8.4–10.4)
Chloride: 109 mEq/L (ref 98–109)
Creatinine: 0.8 mg/dL (ref 0.6–1.1)
EGFR: >90 mL/min/{1.73_m2} (ref >90)
Glucose: 92 mg/dl (ref 70–140)
Potassium: 4.1 mEq/L (ref 3.5–5.1)
Sodium: 140 mEq/L (ref 136–145)
Total Bilirubin: 0.32 mg/dL (ref 0.20–1.20)
Total Protein: 7.2 g/dL (ref 6.4–8.3)

## 2015-11-29 LAB — DRAW EXTRA CLOT TUBE

## 2015-11-29 NOTE — Progress Notes (Signed)
[place]  Telephone:([phone]) [phone] Fax:(336) [phone]     ID: Danaya Geddis DOB: [birth year]  MR#: [id number]  VOJ#:[id number]  Patient Care Team: Abner Greenspan, MD as PCP - General (Family Medicine) Chauncey Cruel, MD as Consulting Physician (Oncology) Megan Salon, MD as Consulting Physician (Gynecology) Lavonna Monarch, MD as Consulting Physician (Dermatology) PCP: Loura Pardon, MD GYN: Felipa Emory MD SU:  OTHER MD: Rolm Bookbinder MD  CHIEF COMPLAINT: high risk family history  CURRENT TREATMENT: screening   HISTORY OF PRESENT ILLNESS: From the original intake note:  Taleen moved to this in the area July 2015, from [place]. She had previously lived here. In [place] she was followed by oncology, dermatology, and gynecology for a family history strongly suggestive of a genetic mutation putting her at increased risk for melanoma, endometrial and breast cancers (her mother had all three cancers at an early age). She did meet with genetic counseling there and on 05/07/2010 was tested for the BRCA1 and BRCA2 genes, with no mutation detected. A broader panel or BART testing were not performed.  She now presents to our high Risk clinic for futher evaluation and follow-up  INTERVAL HISTORY: Makinzey returns to the high risk clinic for continued screening. Since her last visit here she did see a dermatologist at [facility] hematology, but she does not recall who it was. That person told her that she did not need to see a dermatologist except every third years. She also saw Dr. Sabra Heck and had her annual screening--her nodes are pasted below finally Altha Harm had an eye exam for melanoma at the [facility] in August.  REVIEW OF SYSTEMS: She is not exercising. Aside from that a detailed review of systems today was noncontributory  PAST MEDICAL HISTORY: Past Medical History:  Diagnosis Date  . Allergy   . Depression   . Family history of malignant  neoplasm of breast   . Family history of uterine cancer   . Migraine   . UTI (lower urinary tract infection)     PAST SURGICAL HISTORY: Past Surgical History:  Procedure Laterality Date  . BREAST BIOPSY  2011   benign  . TONSILLECTOMY AND ADENOIDECTOMY  1993    FAMILY HISTORY Family History  Problem Relation Age of Onset  . Cancer Mother     uterine @ 11, breast @ 40, and melanoma @ 57  . Hypertension Mother   . Hyperlipidemia Father   . Hypertension Father   . Diabetes Father   . Cancer Maternal Grandfather     lung; smoker; deceased 40  . Cancer Paternal Grandmother 91    colon cancer; deceased 25  . Alcohol abuse Paternal Grandfather   . Cancer Maternal Aunt     skin & colon polyps (#/type?)  . Cancer Maternal Uncle     skin  . Cancer Paternal Aunt 62    breast; currently 43  . Migraines Other     paternal side  . Asthma Mother   . Arrhythmia Mother   . Heart block Father     had angioplasty   the patient's mother was diagnosed with cancer of the uterus at age 23, cancer of the breast at age 53, and melanoma at age 60. This may well represent a p53 mutation, but there are many other possibilities. Unfortunately the patient's mother, who is still living, refuses to be genetically tested. The patient's maternal grandfather had a history of brain and lung cancer (these apparently were 2 separate primaries).  He was a smoker. He died at age 29. 28 of the patient's mother's siblings had skin cancers, but not melanomas. On the paternal side the paternal grandmother was diagnosed with colon cancer at age 52 and a paternal aunt was diagnosed with breast cancer at age 41. The patient's father has a history of precancerous polyps.  GYNECOLOGIC HISTORY:  No LMP recorded. Menarche age 59. The patient is GX P0. She understands that waiting after age 20 to carry a child essentially doubles the risk of breast cancer.  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 She also has a second job at [employer]. She is single, and lives alone, with no pets    ADVANCED DIRECTIVES: Not in place. At her 04/26/2014 visit she was given the appropriate documents 2 complete and notarize our her discretion.   HEALTH MAINTENANCE: Social History  Substance Use Topics  . Smoking status: Never Smoker  . Smokeless tobacco: Never Used  . Alcohol use 1.8 oz/week    3 Standard drinks or equivalent per week     Colonoscopy: Never  PAP: March 2015  Bone density: Never  Lipid panel:  Allergies  Allergen Reactions  . Codone [Hydrocodone]   . Penicillins   . Sulfur     Current Outpatient Prescriptions  Medication Sig Dispense Refill  . cetirizine (ZYRTEC) 10 MG tablet Take 10 mg by mouth daily.    . norethindrone-ethinyl estradiol (MICROGESTIN FE 1/20) 1-20 MG-MCG tablet Take 1 tablet by mouth daily. 3 Package 4  . OnabotulinumtoxinA (BOTOX IJ) Inject as directed. Reported on 02/23/2015    . SUMAtriptan (IMITREX) 100 MG tablet Take 100 mg by mouth every 2 (two) hours as needed for migraine or headache. Reported on 02/23/2015     No current facility-administered medications for this visit.     OBJECTIVE: Young white woman Who appears well Vitals:   11/29/15 1614  BP: 116/81  Pulse: 74  Resp: 18  Temp: 98.3 F (36.8 C)     Body mass index is 27.9 kg/m.    ECOG FS:0 - Asymptomatic  Sclerae unicteric, EOMs intact Oropharynx clear and moist No cervical or supraclavicular adenopathy Lungs no rales or rhonchi Heart regular rate and rhythm Abd soft, nontender, positive bowel sounds MSK no focal spinal tenderness, no upper extremity lymphedema Neuro: nonfocal, well oriented, appropriate affect Breasts: No masses or skin or nipple changes noted in either breast. Both axillae are benign.   LAB RESULTS:  CMP     Component Value Date/Time   NA 140 11/29/2015 1514   K 4.1 11/29/2015 1514   CL 102 06/09/2014 1634     CO2 23 11/29/2015 1514   GLUCOSE 92 11/29/2015 1514   BUN 11.5 11/29/2015 1514   CREATININE 0.8 11/29/2015 1514   CALCIUM 9.4 11/29/2015 1514   PROT 7.2 11/29/2015 1514   ALBUMIN 3.8 11/29/2015 1514   AST 13 11/29/2015 1514   ALT 11 11/29/2015 1514   ALKPHOS 53 11/29/2015 1514   BILITOT 0.32 11/29/2015 1514    INo results found for: SPEP, UPEP  Lab Results  Component Value Date   WBC 7.3 11/29/2015   NEUTROABS 4.3 11/29/2015   HGB 14.4 11/29/2015   HCT 43.4 11/29/2015   MCV 87.5 11/29/2015   PLT Clumped Platelets--Appears Adequate 11/29/2015      Chemistry      Component Value Date/Time   NA 140 11/29/2015 1514   K 4.1 11/29/2015  1514   CL 102 06/09/2014 1634   CO2 23 11/29/2015 1514   BUN 11.5 11/29/2015 1514   CREATININE 0.8 11/29/2015 1514      Component Value Date/Time   CALCIUM 9.4 11/29/2015 1514   ALKPHOS 53 11/29/2015 1514   AST 13 11/29/2015 1514   ALT 11 11/29/2015 1514   BILITOT 0.32 11/29/2015 1514       No results found for: LABCA2  No components found for: LABCA125  No results for input(s): INR in the last 168 hours.  Urinalysis    Component Value Date/Time   BILIRUBINUR negative 06/14/2014 1630   PROTEINUR negative 06/14/2014 1630   UROBILINOGEN 0.2 06/14/2014 1630   NITRITE negative 06/14/2014 1630   LEUKOCYTESUR large (3+) 06/14/2014 1630    STUDIES: [place] 08/31/2015 and Dr. Ammie Ferrier comments as follows: UTERUS: 7.6 x 5.0 x 3.9cm, arcuate shaped endometrial cavity EMS: 4.21m ADNEXA: Left ovary:  2.4 x 1.0 x 0.8cm                  Right ovary: 3.7 x 2.8 x 2.1cm with 1.6cm simple appearing ovarian follicle CUL DE SAC: no free fluid  Discussion:  Findings reviewed with pt.  Appearance of endometrial cavity and endometrial thickness reviewed.  Do not feel pt needs an endometrial biopsy at this time.  Pt comfortable with plan.  Knows to call with any changes to her bleeding.  All questions answered.   ASSESSMENT: 35y.o. [place]  woman with a high-risk family history for breast, uterine, melanoma and colon cancers  (1) BRCA 1 and 2 testing 05/07/2010 negative (BARTs not sent)  (2) genetics [facility]05[facility]2014 [facility] mutations in ATM, BARD1, BRCA1, BRCA2, BRIP1, CDH1, CHEK2, MRE11A, MUTYH, NBN, NF1, PALB2, PTEN, RAD50, RAD51C, RAD51D, and TP53  (3) screening plan:  (a) yearly gynecology visits/ [facility]  (b) yearly breast MRI in addition to mammography or biannual mammography/ Karl[facility] Lichtman  (c) biannual dermatology screening/ [place]  (d) annual ophthal[place] exam/ [place] getting her team together so she can be screened appropriately on a regular basis. We are going to switch dermatology to Dr. TDenna Haggard. The other change we are contemplating is not obtaining an MRI every year. This is simply more than she can afford at present. Instead we were going to go with biannual mammography. Of course she also will have breast exams 3 times a year, with her primary care doctor, with myself, and with Dr. MSabra Heck  She is very pleased with this plan. She will see me again in a year. She knows to call for any problems that may develop before that visit.  MChauncey Cruel MD   11/29/2015 4:32 PM Medical Oncology and Hematology [facility] - [facility] Road55[address]AShaver [place] [id number]. [phone]   Fax. [phone]

## 2015-12-04 ENCOUNTER — Encounter: Payer: Self-pay | Admitting: Obstetrics & Gynecology

## 2015-12-05 ENCOUNTER — Ambulatory Visit
Admission: RE | Admit: 2015-12-05 | Discharge: 2015-12-05 | Disposition: A | Discharge status: Home / Self Care | Payer: BC Managed Care – PPO | Source: Ambulatory Visit | Attending: Oncology | Admitting: Oncology

## 2015-12-05 DIAGNOSIS — Z803 Family history of malignant neoplasm of breast: Secondary | ICD-10-CM

## 2015-12-05 DIAGNOSIS — Z1239 Encounter for other screening for malignant neoplasm of breast: Secondary | ICD-10-CM

## 2016-06-11 ENCOUNTER — Ambulatory Visit (INDEPENDENT_AMBULATORY_CARE_PROVIDER_SITE_OTHER): Payer: BC Managed Care – PPO | Admitting: Primary Care

## 2016-06-11 ENCOUNTER — Encounter: Payer: Self-pay | Admitting: Primary Care

## 2016-06-11 VITALS — BP 122/84 | HR 78 | Temp 98.3°F | Ht 63.0 in | Wt 159.1 lb

## 2016-06-11 DIAGNOSIS — R059 Cough, unspecified: Secondary | ICD-10-CM

## 2016-06-11 DIAGNOSIS — R05 Cough: Secondary | ICD-10-CM

## 2016-06-11 MED ORDER — ALBUTEROL SULFATE HFA 108 (90 BASE) MCG/ACT IN AERS: 2.0000 | INHALATION_SPRAY | RESPIRATORY_TRACT | 1 refills | Status: DC | PRN

## 2016-06-11 NOTE — Progress Notes (Signed)
Pre visit review using our clinic review tool, if applicable. No additional management support is needed unless otherwise documented below in the visit note. 

## 2016-06-11 NOTE — Progress Notes (Signed)
Subjective:    Patient ID: Kirsten Wilson, female    DOB: [birth year], 36 y.o.   MRN: [id number]  HPI  Ms. Broz is a 36 year old female with a history of asthma who presents today for Urgent Care follow up. Her symptoms have been present since March 25th.  She presented to [facility] on 06/02/16 with a chief complaint of cough and ear pain. She was prescribed cefdinir and tessalon pearls, completed the antibiotics three days ago without much improvement.   Since her Urgent Care visit she's continued to notice dry cough, right ear pain, sinus pressure, headaches. She's running low grade fevers. She's not blowing anything from her nasal cavity. Overall she's feeling about the same. She's been using Flonase and taking Zyrtec without much improvement.   Review of Systems  Constitutional: Positive for fever. Negative for chills and fatigue.  HENT: Positive for ear pain and sinus pressure. Negative for congestion and sore throat.   Respiratory: Positive for cough. Negative for shortness of breath and wheezing.   Cardiovascular: Negative for chest pain.       Past Medical History:  Diagnosis Date  . Allergy   . Depression   . Family history of malignant neoplasm of breast   . Family history of uterine cancer   . Migraine   . UTI (lower urinary tract infection)      Social History   Social History  . Marital status: Single    Spouse name: N/A  . Number of children: N/A  . Years of education: N/A   Occupational History  . Not on file.   Social History Main Topics  . Smoking status: Never Smoker  . Smokeless tobacco: Never Used  . Alcohol use 1.8 oz/week    3 Standard drinks or equivalent per week  . Drug use: No  . Sexual activity: Yes    Partners: Male    Birth control/ protection: Pill     Comment: Microgestin   Other Topics Concern  . Not on file   Social History Narrative  . No narrative on file    Past Surgical History:  Procedure Laterality  Date  . BREAST BIOPSY  2011   benign  . TONSILLECTOMY AND ADENOIDECTOMY  1993    Family History  Problem Relation Age of Onset  . Cancer Mother     uterine @ 11, breast @ 67, and melanoma @ 42  . Hypertension Mother   . Hyperlipidemia Father   . Hypertension Father   . Diabetes Father   . Cancer Maternal Grandfather     lung; smoker; deceased 48  . Cancer Paternal Grandmother 50    colon cancer; deceased 1  . Alcohol abuse Paternal Grandfather   . Cancer Maternal Aunt     skin & colon polyps (#/type?)  . Cancer Maternal Uncle     skin  . Cancer Paternal Aunt 71    breast; currently 76  . Migraines Other     paternal side  . Asthma Mother   . Arrhythmia Mother   . Heart block Father     had angioplasty    Allergies  Allergen Reactions  . Codone [Hydrocodone]   . Penicillins   . Sulfur     Current Outpatient Prescriptions on File Prior to Visit  Medication Sig Dispense Refill  . cetirizine (ZYRTEC) 10 MG tablet Take 10 mg by mouth daily.    . OnabotulinumtoxinA (BOTOX IJ) Inject as directed. Reported on 02/23/2015    .  SUMAtriptan (IMITREX) 100 MG tablet Take 100 mg by mouth every 2 (two) hours as needed for migraine or headache. Reported on 02/23/2015     No current facility-administered medications on file prior to visit.     BP 122/84   Pulse 78   Temp 98.3 F (36.8 C) (Oral)   Ht 5\' 3"  (1.6 m)   Wt 159 lb 1.9 oz (72.2 kg)   LMP 05/12/2016   SpO2 98%   BMI 28.19 kg/m    Objective:   Physical Exam  Constitutional: She appears well-nourished.  HENT:  Right Ear: Tympanic membrane and ear canal normal.  Left Ear: Tympanic membrane and ear canal normal.  Nose: Right sinus exhibits no maxillary sinus tenderness and no frontal sinus tenderness. Left sinus exhibits no maxillary sinus tenderness and no frontal sinus tenderness.  Mouth/Throat: Oropharynx is clear and moist.  Eyes: Conjunctivae are normal.  Neck: Neck supple.  Cardiovascular: Normal rate  and regular rhythm.   Pulmonary/Chest: Effort normal and breath sounds normal. She has no wheezes. She has no rales.  Dry cough during exam  Lymphadenopathy:    She has no cervical adenopathy.  Skin: Skin is warm and dry.          Assessment & Plan:  URI vs Asthma Exacerbation:  Symptoms since March 25th. Treated at [facility] on 04/01, no improvement after antibiotics and tessalon pearls.  Exam today not suggestive of bacterial involvement. Dry cough today representative of bronchospasm secondary of asthma/allergy involvement.  Rx for albuterol inhaler sent to pharmacy. Discussed to continue Zyrtec, try Delsym OTC for cough. She will update Friday this week if no improvement.  Sheral Flow, NP

## 2016-06-11 NOTE — Patient Instructions (Signed)
Shortness of Breath/Wheezing/Cough: Use the albuterol inhaler. Inhale 2 puffs into the lungs every 4-6 hours as needed for cough.  Try Delsym cough syrup. This may be purchased over the counter.  Please message me on My Chart if no improvement on Friday.  It was a pleasure meeting you!

## 2016-08-08 ENCOUNTER — Other Ambulatory Visit: Payer: Self-pay | Admitting: Obstetrics & Gynecology

## 2016-08-09 NOTE — Telephone Encounter (Signed)
Medication refill request: Norethindrone-Ethinyl Estradiol Last AEX:  08/14/15 SM Next AEX: 08/19/16 SM Last MMG (if hormonal medication request): 12/05/15 BIRADS1, density C[facility] Refill authorized: 08/14/15 #3 Package 4R. Please advise. Thank you.

## 2016-08-19 ENCOUNTER — Other Ambulatory Visit (HOSPITAL_COMMUNITY)
Admission: RE | Admit: 2016-08-19 | Discharge: 2016-08-19 | Disposition: A | Discharge status: Home / Self Care | Payer: BC Managed Care – PPO | Source: Ambulatory Visit | Attending: Obstetrics & Gynecology | Admitting: Obstetrics & Gynecology

## 2016-08-19 ENCOUNTER — Encounter: Payer: Self-pay | Admitting: Obstetrics & Gynecology

## 2016-08-19 ENCOUNTER — Ambulatory Visit (INDEPENDENT_AMBULATORY_CARE_PROVIDER_SITE_OTHER): Payer: BC Managed Care – PPO | Admitting: Obstetrics & Gynecology

## 2016-08-19 VITALS — BP 126/80 | HR 78 | Resp 16 | Ht 63.5 in | Wt 146.0 lb

## 2016-08-19 DIAGNOSIS — Z8049 Family history of malignant neoplasm of other genital organs: Secondary | ICD-10-CM

## 2016-08-19 DIAGNOSIS — Z803 Family history of malignant neoplasm of breast: Secondary | ICD-10-CM | POA: Diagnosis not present

## 2016-08-19 DIAGNOSIS — Z01419 Encounter for gynecological examination (general) (routine) without abnormal findings: Secondary | ICD-10-CM | POA: Insufficient documentation

## 2016-08-19 DIAGNOSIS — Z9189 Other specified personal risk factors, not elsewhere classified: Secondary | ICD-10-CM | POA: Diagnosis not present

## 2016-08-19 DIAGNOSIS — Z124 Encounter for screening for malignant neoplasm of cervix: Secondary | ICD-10-CM | POA: Diagnosis not present

## 2016-08-19 MED ORDER — NORETHIN ACE-ETH ESTRAD-FE 1-20 MG-MCG PO TABS: 1.0000 | ORAL_TABLET | Freq: Every day | ORAL | 4 refills | Status: DC

## 2016-08-19 NOTE — Addendum Note (Signed)
Addended by: Gwendlyn Deutscher on: 08/19/2016 03:59 PM   Modules accepted: Orders

## 2016-08-19 NOTE — Progress Notes (Signed)
36 y.o. G0P0000 SingleCaucasianF here for annual exam.  Doing well.  Cycles regular and last 3 days.  Has same sexual partner.  Patient's last menstrual period was 07/30/2016.          Sexually active: Yes.    The current method of family planning is OCP (estrogen/progesterone).    Exercising: Yes.    running Smoker:  no  Health Maintenance: Pap:  08/14/15 ASCUS. HR HPV:neg   07/21/14 Neg. HR HPV:neg   History of abnormal Pap:  no MMG:  12/05/15 BIRADS1:neg  Breast MRI:  8/16.  Negative Colonoscopy:  Never BMD:  Never TDaP:  06/2014  Pneumonia vaccine(s):  N/A Zostavax:   N/A Hep C testing: N/A Screening Labs: PCP   reports that she has never smoked. She has never used smokeless tobacco. She reports that she drinks about 1.8 oz of alcohol per week . She reports that she does not use drugs.  Past Medical History:  Diagnosis Date  . Allergy   . Depression   . Family history of malignant neoplasm of breast   . Family history of uterine cancer   . Migraine   . UTI (lower urinary tract infection)     Past Surgical History:  Procedure Laterality Date  . BREAST BIOPSY  2011   benign  . TONSILLECTOMY AND ADENOIDECTOMY  1993    Current Outpatient Prescriptions  Medication Sig Dispense Refill  . albuterol (PROAIR HFA) 108 (90 Base) MCG/ACT inhaler Inhale 2 puffs into the lungs every 4 (four) hours as needed for wheezing or shortness of breath (cough). 1 Inhaler 1  . cetirizine (ZYRTEC) 10 MG tablet Take 10 mg by mouth daily.    . fluticasone (FLONASE) 50 MCG/ACT nasal spray INHALE 2 SPRAY BY INTRANASAL ROUTE EVERY DAY IN EACH NOSTRIL  0  . OnabotulinumtoxinA (BOTOX IJ) Inject as directed. Reported on 02/23/2015    . SUMAtriptan (IMITREX) 100 MG tablet Take 100 mg by mouth every 2 (two) hours as needed for migraine or headache. Reported on 02/23/2015    . JUNEL FE 1/20 1-20 MG-MCG tablet TAKE 1 TABLET BY MOUTH DAILY 84 tablet 0   No current facility-administered medications for  this visit.     Family History  Problem Relation Age of Onset  . Cancer Mother        uterine @ 80, breast @ 40, and melanoma @ 13  . Hypertension Mother   . Asthma Mother   . Arrhythmia Mother   . Hyperlipidemia Father   . Hypertension Father   . Diabetes Father   . Heart block Father        had angioplasty  . Cancer Maternal Grandfather        lung; smoker; deceased 7  . Cancer Paternal Grandmother 23       colon cancer; deceased 44  . Alcohol abuse Paternal Grandfather   . Cancer Maternal Aunt        skin & colon polyps (#/type?)  . Cancer Maternal Uncle        skin  . Cancer Paternal Aunt 53       breast; currently 70  . Migraines Other        paternal side    ROS:  Pertinent items are noted in HPI.  Otherwise, a comprehensive ROS was negative.  Exam:   BP 126/80 (BP Location: Right Arm, Patient Position: Sitting, Cuff Size: Normal)   Pulse 78   Resp 16   Ht 5' 3.5" (1.613 m)  Wt 146 lb (66.2 kg)   LMP 07/30/2016   BMI 25.46 kg/m    Height: 5' 3.5" (161.3 cm)  Ht Readings from Last 3 Encounters:  08/19/16 5' 3.5" (1.613 m)  06/11/16 5\' 3"  (1.6 m)  08/14/15 5\' 3"  (1.6 m)    General appearance: alert, cooperative and appears stated age Head: Normocephalic, without obvious abnormality, atraumatic Neck: no adenopathy, supple, symmetrical, trachea midline and thyroid normal to inspection and palpation Lungs: clear to auscultation bilaterally Breasts: normal appearance, no masses or tenderness Heart: regular rate and rhythm Abdomen: soft, non-tender; bowel sounds normal; no masses,  no organomegaly Extremities: extremities normal, atraumatic, no cyanosis or edema Skin: Skin color, texture, turgor normal. No rashes or lesions Lymph nodes: Cervical, supraclavicular, and axillary nodes normal. No abnormal inguinal nodes palpated Neurologic: Grossly normal   Pelvic: External genitalia:  no lesions              Urethra:  normal appearing urethra with no  masses, tenderness or lesions              Bartholins and Skenes: normal                 Vagina: normal appearing vagina with normal color and discharge, no lesions              Cervix: no lesions              Pap taken: Yes.   Bimanual Exam:  Uterus:  normal size, contour, position, consistency, mobility, non-tender              Adnexa: normal adnexa and no mass, fullness, tenderness               Rectovaginal: Confirms               Anus:  normal sphincter tone, no lesions  Chaperone was present for exam.  A:  Well Woman with normal exam Strong family hx of breast, uteirne (mother diagnosed age 53) and melanoma Paternal family hx of colon cancer--paternal GM  P:   Mammogram and MRI yearly, alternating every six months (last MRI was denied).  Will try again this year to see if can get coverage. pap smear obtained today PUS ordered Rx for microgestin 1/20 FE to pharmacy.  #90 day supply/4RF Return annually or prn

## 2016-08-21 ENCOUNTER — Telehealth: Payer: Self-pay | Admitting: Obstetrics & Gynecology

## 2016-08-21 LAB — CYTOLOGY - PAP: Diagnosis: NEGATIVE

## 2016-08-21 NOTE — Telephone Encounter (Signed)
Call placed to patient to review benefits for a scheduled ultrasound. Left voicemail requesting a return call. °

## 2016-08-21 NOTE — Telephone Encounter (Signed)
Patient returned call. Reviewed benefit for scheduled ultrasound. Patient understood and agreeable. Patient scheduled 08/29/16 with Dr Sabra Heck. Patient aware of date, arrival time and cancellation policy. No further questions. Ok to close

## 2016-08-29 ENCOUNTER — Ambulatory Visit (INDEPENDENT_AMBULATORY_CARE_PROVIDER_SITE_OTHER): Payer: BC Managed Care – PPO | Admitting: Obstetrics & Gynecology

## 2016-08-29 ENCOUNTER — Encounter: Payer: Self-pay | Admitting: Obstetrics & Gynecology

## 2016-08-29 ENCOUNTER — Ambulatory Visit (INDEPENDENT_AMBULATORY_CARE_PROVIDER_SITE_OTHER): Payer: BC Managed Care – PPO

## 2016-08-29 ENCOUNTER — Telehealth: Payer: Self-pay

## 2016-08-29 VITALS — BP 110/70 | HR 60 | Resp 16 | Ht 63.5 in | Wt 146.0 lb

## 2016-08-29 DIAGNOSIS — Z8049 Family history of malignant neoplasm of other genital organs: Secondary | ICD-10-CM

## 2016-08-29 NOTE — Progress Notes (Signed)
36 y.o. G0P0000 Single Caucasian female here for pelvic ultrasound due to strong family hx of gyn cancer.  Her mother was diagnosed with endometrial cancer at age 75.  Pt is on OCPs.  She's had negative genetic testing.  MRI is not scheduled but pt will call once her cycle begins to schedule this.    Patient's last menstrual period was 07/30/2016.  Findings:  UTERUS: 7.0 x 4.4 x 3.6cm EMS: 1.66mm ADNEXA: Left ovary: 1.7 x 0.8 x 0.6       Right ovary: 2.7 x 1.9 x 1.4cm CUL DE SAC: no free fluid  Discussion:  Findings reviewed.  No abnormalities noted.  Will continue yearly gyn ultrasounds.  Assessment:  Strong family hx of uterine cancer Normal PUS today  Plan:  Repeat 1 year.  ~15 minutes spent with patient >50% of time was in face to face discussion of above.

## 2016-08-29 NOTE — Telephone Encounter (Signed)
Megan Salon, MD  Sprague, Harley Hallmark, RN  Cc: Lerry Liner R        Pt has strong family hx of breast cancer and have increased lifetime risk of breast cancer of >20%. Needs yearly MRI.  Is overdue for next MRI. Can you please schedule and precert this? Thanks.    Order for bilateral breast MRI w wo contrast has been placed for pre authorization. Routing to [employer].

## 2016-09-15 ENCOUNTER — Telehealth: Payer: Self-pay

## 2016-09-15 NOTE — Telephone Encounter (Signed)
Called and left a message with a new appt due to dr gm out of the office  Kirsten Wilson

## 2016-09-17 ENCOUNTER — Encounter: Payer: Self-pay | Admitting: Obstetrics & Gynecology

## 2016-09-24 NOTE — Telephone Encounter (Signed)
Pre-authorization approved. Patient has been scheduled by [facility] on 10/02/16 at 12 pm. Patient placed in imaging hold.  Routing to provider for final review. Patient agreeable to disposition. Will close encounter.

## 2016-10-01 ENCOUNTER — Telehealth: Payer: Self-pay | Admitting: Obstetrics & Gynecology

## 2016-10-01 NOTE — Telephone Encounter (Signed)
Spoke with patient, cancelled recommended bilateral breast MRI d/t cost. For strong family history of breast cancer and increased lifetime risk of breast cancer > 20%.  Would like to proceded with alternative option discussed, imaging at [facility].   Advised patient would review with Dr. Sabra Heck and return call. Advised Dr. Sabra Heck is in surgery, response may not be immediate, patient is agreeable.  Dr. Sabra Heck -please advise on MMG order.

## 2016-10-01 NOTE — Telephone Encounter (Signed)
Patient cancelled MRI because of cost and would like to speak with nurse.

## 2016-10-01 NOTE — Telephone Encounter (Signed)
Left message to call Analiya Porco at [phone].  

## 2016-10-02 ENCOUNTER — Other Ambulatory Visit: Payer: BC Managed Care – PPO

## 2016-10-02 NOTE — Telephone Encounter (Signed)
Spoke with patient, advised as seen below per Dr. Miller. Patient verbalizes understanding and is agreeable.  Patient is agreeable to disposition. Will close encounter.  

## 2016-10-02 NOTE — Telephone Encounter (Signed)
She should continue doing yearly 3D mammograms and try to do these very closely to yearly as possible.

## 2016-11-11 ENCOUNTER — Ambulatory Visit (INDEPENDENT_AMBULATORY_CARE_PROVIDER_SITE_OTHER): Payer: BC Managed Care – PPO | Admitting: Family Medicine

## 2016-11-11 ENCOUNTER — Encounter: Payer: Self-pay | Admitting: Family Medicine

## 2016-11-11 VITALS — BP 126/64 | HR 72 | Temp 99.0°F | Ht 63.5 in | Wt 137.8 lb

## 2016-11-11 DIAGNOSIS — J0100 Acute maxillary sinusitis, unspecified: Secondary | ICD-10-CM

## 2016-11-11 DIAGNOSIS — J019 Acute sinusitis, unspecified: Secondary | ICD-10-CM | POA: Insufficient documentation

## 2016-11-11 MED ORDER — AZITHROMYCIN 250 MG PO TABS: ORAL_TABLET | ORAL | 0 refills | Status: DC

## 2016-11-11 MED ORDER — BENZONATATE 200 MG PO CAPS: 200.0000 mg | ORAL_CAPSULE | Freq: Three times a day (TID) | ORAL | 1 refills | Status: DC | PRN

## 2016-11-11 NOTE — Patient Instructions (Signed)
I think you have a sinus infection  Drink lots of fluid and get extra rest  Take the zpak as directed Tessalon for cough  mucinex is fine   Update if not starting to improve in a week or if worsening

## 2016-11-11 NOTE — Progress Notes (Signed)
Subjective:    Patient ID: Kirsten Wilson, female    DOB: [birth year], 36 y.o.   MRN: [id number]  HPI Here for uri symptoms incl ST and fever and congestion   Started 8/31 Nasal drainage/st -cold symptoms  Then got more congested pnd with st (mucous clear to yellow)  Face is hurting - worse to bend forward   flonase Zyrtec mucinex  emergen-C  Temp: 99 F (37.2 C)  Low grade temp started yesterday evening   (took tylenol )   Patient Active Problem List   Diagnosis Date Noted  . Acute sinusitis 11/11/2016  . 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 Cervical polyp 08/26/2014  . Urinary frequency 06/14/2014  . Genetic testing 05/27/2014  . Family history of uterine cancer   . Breast cancer screening, high risk patient 03/30/2014  . Allergic rhinitis 03/30/2014  . Migraine 03/30/2014  . History of depression 03/30/2014  . Family history of breast cancer 03/30/2014   Past Medical History:  Diagnosis Date  . Allergy   . Depression   . Family history of malignant neoplasm of breast   . Family history of uterine cancer   . Migraine   . UTI (lower urinary tract infection)    Past Surgical History:  Procedure Laterality Date  . BREAST BIOPSY  2011   benign  . TONSILLECTOMY AND ADENOIDECTOMY  1993   Social History  Substance Use Topics  . Smoking status: Never Smoker  . Smokeless tobacco: Never Used  . Alcohol use 1.8 oz/week    3 Standard drinks or equivalent per week   Family History  Problem Relation Age of Onset  . Cancer Mother        uterine @ 61, breast @ 53, and melanoma @ 27  . Hypertension Mother   . Asthma Mother   . Arrhythmia Mother   . Hyperlipidemia Father   . Hypertension Father   . Diabetes Father   . Heart block Father        had angioplasty  . Cancer Maternal Grandfather        lung; smoker; deceased 18  . Cancer Paternal Grandmother 107       colon cancer; deceased 13  . Alcohol abuse Paternal Grandfather   . Cancer Maternal Aunt        skin  & colon polyps (#/type?)  . Cancer Maternal Uncle        skin  . Cancer Paternal Aunt 40       breast; currently 73  . Migraines Other        paternal side   Allergies  Allergen Reactions  . Codone [Hydrocodone]   . Penicillins   . Sulfur    Current Outpatient Prescriptions on File Prior to Visit  Medication Sig Dispense Refill  . albuterol (PROAIR HFA) 108 (90 Base) MCG/ACT inhaler Inhale 2 puffs into the lungs every 4 (four) hours as needed for wheezing or shortness of breath (cough). 1 Inhaler 1  . cetirizine (ZYRTEC) 10 MG tablet Take 10 mg by mouth daily.    . fluticasone (FLONASE) 50 MCG/ACT nasal spray INHALE 2 SPRAY BY INTRANASAL ROUTE EVERY DAY IN EACH NOSTRIL  0  . norethindrone-ethinyl estradiol (JUNEL FE 1/20) 1-20 MG-MCG tablet Take 1 tablet by mouth daily. 84 tablet 4  . OnabotulinumtoxinA (BOTOX IJ) Inject as directed. Reported on 02/23/2015    . SUMAtriptan (IMITREX) 100 MG tablet Take 100 mg by mouth every 2 (two) hours as needed for migraine or headache. Reported  on 02/23/2015     No current facility-administered medications on file prior to visit.     Review of Systems  Constitutional: Positive for appetite change. Negative for fatigue and fever.  HENT: Positive for congestion, ear pain, postnasal drip, rhinorrhea, sinus pressure and sore throat. Negative for nosebleeds.   Eyes: Negative for pain, redness and itching.  Respiratory: Positive for cough. Negative for shortness of breath and wheezing.   Cardiovascular: Negative for chest pain.  Gastrointestinal: Negative for abdominal pain, diarrhea, nausea and vomiting.  Endocrine: Negative for polyuria.  Genitourinary: Negative for dysuria, frequency and urgency.  Musculoskeletal: Negative for arthralgias and myalgias.  Allergic/Immunologic: Negative for immunocompromised state.  Neurological: Positive for headaches. Negative for dizziness, tremors, syncope, weakness and numbness.  Hematological: Negative for  adenopathy. Does not bruise/bleed easily.  Psychiatric/Behavioral: Negative for dysphoric mood. The patient is not nervous/anxious.        Objective:   Physical Exam  Constitutional: She appears well-developed and well-nourished. No distress.  HENT:  Head: Normocephalic and atraumatic.  Right Ear: External ear normal.  Left Ear: External ear normal.  Mouth/Throat: Oropharynx is clear and moist. No oropharyngeal exudate.  Nares are injected and congested  Bilateral maxillary sinus tenderness  Post nasal drip   Eyes: Pupils are equal, round, and reactive to light. Conjunctivae and EOM are normal. Right eye exhibits no discharge. Left eye exhibits no discharge.  Neck: Normal range of motion. Neck supple.  Cardiovascular: Normal rate and regular rhythm.   Pulmonary/Chest: Effort normal and breath sounds normal. No respiratory distress. She has no wheezes. She has no rales.  Good air exch   Lymphadenopathy:    She has no cervical adenopathy.  Neurological: She is alert. No cranial nerve deficit.  Skin: Skin is warm and dry. No rash noted.  Psychiatric: She has a normal mood and affect.          Assessment & Plan:   Problem List Items Addressed This Visit      Respiratory   Acute sinusitis    S/p uri day 10  Cover with zpak (pcn all)  Disc symptomatic care - see instructions on AVS  Tessalon prn cough  Expectorant prn  Update if not starting to improve in a week or if worsening        Relevant Medications   azithromycin (ZITHROMAX Z-PAK) 250 MG tablet   benzonatate (TESSALON) 200 MG capsule

## 2016-11-11 NOTE — Assessment & Plan Note (Signed)
S/p uri day 10  Cover with zpak (pcn all)  Disc symptomatic care - see instructions on AVS  Tessalon prn cough  Expectorant prn  Update if not starting to improve in a week or if worsening

## 2016-11-27 ENCOUNTER — Other Ambulatory Visit: Payer: BC Managed Care – PPO

## 2016-11-27 ENCOUNTER — Ambulatory Visit: Payer: BC Managed Care – PPO | Admitting: Oncology

## 2016-12-09 ENCOUNTER — Other Ambulatory Visit: Payer: Self-pay | Admitting: Oncology

## 2016-12-09 DIAGNOSIS — Z1231 Encounter for screening mammogram for malignant neoplasm of breast: Secondary | ICD-10-CM

## 2016-12-30 ENCOUNTER — Encounter: Payer: Self-pay | Admitting: Oncology

## 2016-12-30 ENCOUNTER — Other Ambulatory Visit: Payer: Self-pay | Admitting: Oncology

## 2016-12-30 ENCOUNTER — Ambulatory Visit
Admission: RE | Admit: 2016-12-30 | Discharge: 2016-12-30 | Disposition: A | Discharge status: Home / Self Care | Payer: BC Managed Care – PPO | Source: Ambulatory Visit | Attending: Oncology | Admitting: Oncology

## 2016-12-30 DIAGNOSIS — N6310 Unspecified lump in the right breast, unspecified quadrant: Secondary | ICD-10-CM

## 2016-12-30 DIAGNOSIS — Z1231 Encounter for screening mammogram for malignant neoplasm of breast: Secondary | ICD-10-CM

## 2016-12-31 ENCOUNTER — Other Ambulatory Visit: Payer: Self-pay | Admitting: Oncology

## 2016-12-31 ENCOUNTER — Telehealth: Payer: Self-pay

## 2016-12-31 NOTE — Progress Notes (Signed)
[place]  Telephone:([phone]) [phone] Fax:(336) [phone]     ID: Kirsten Wilson DOB: 1980/03/19  MR#: [id number]  EKC#:[id number]  Patient Care Team: Abner Greenspan, MD as PCP - General (Family Medicine) Mailee Klaas, Virgie Dad, MD as Consulting Physician (Oncology) Megan Salon, MD as Consulting Physician (Gynecology) Lavonna Monarch, MD as Consulting Physician (Dermatology) PCP: Abner Greenspan, MD GYN: Felipa Emory MD SU:  OTHER MD: Rolm Bookbinder MD  CHIEF COMPLAINT: high risk family history for breast cancer  CURRENT TREATMENT: Intensified screening   HISTORY OF PRESENT ILLNESS: From the original intake note:  Kirsten Wilson moved to this in the area July 2015, from [place]. She had previously lived here. In [place] she was followed by oncology, dermatology, and gynecology for a family history strongly suggestive of a genetic mutation putting her at increased risk for melanoma, endometrial and breast cancers (her mother had all three cancers at an early age). She did meet with genetic counseling there and on 05/07/2010 was tested for the BRCA1 and BRCA2 genes, with no mutation detected. A broader panel or BART testing were not performed.  She now presents to our high Risk clinic for futher evaluation and follow-up  INTERVAL HISTORY: Kirsten Wilson returns today for follow-up of her intensified screening program given her high risk of breast cancer.  We are currently doing mammography every 6 months since she cannot afford a breast MRI yearly, which of course would be preferable.  She has difficulty getting the mammography scheduled and I think it would be helpful if she had a [place] or [place] at the breast center.  We will see if we can set that up for her  She also gets yearly pelvic ultrasonography through Dr. Sabra Heck we are scheduling our visits about 6 months apart to optimize the screening process.   REVIEW OF SYSTEMS: [name] reports loosing weight. She started  running 2-3 times a week. She is able to run a couple miles each time. She is also trying to eat better. She will meal prep each week and she has cut out fast food. She is also trying to eat more fruits and vegetables. 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 She denies unusual headaches, visual changes, nausea, vomiting, or dizziness. There has been no unusual cough, phlegm production, or pleurisy. This been no change in bowel or bladder habits. She denies unexplained fatigue or unexplained weight loss, bleeding, rash, or fever. A detailed review of systems was otherwise entirely stable.    PAST MEDICAL HISTORY: Past Medical History:  Diagnosis Date  . Allergy   . Depression   . Family history of malignant neoplasm of breast   . Family history of uterine cancer   . Migraine   . UTI (lower urinary tract infection)     PAST SURGICAL HISTORY: Past Surgical History:  Procedure Laterality Date  . BREAST BIOPSY  2011   benign  . BREAST EXCISIONAL BIOPSY Left   . TONSILLECTOMY AND ADENOIDECTOMY  1993    FAMILY HISTORY Family History  Problem Relation Age of Onset  . Cancer Mother        uterine @ 69, breast @ 58, and melanoma @ 42  . Hypertension Mother   . Asthma Mother   . Arrhythmia Mother   . Hyperlipidemia Father   . Hypertension Father   . Diabetes Father   . Heart block Father        had angioplasty  . Cancer  Maternal Grandfather        lung; smoker; deceased 33  . Cancer Paternal Grandmother 10       colon cancer; deceased 63  . Alcohol abuse Paternal Grandfather   . Cancer Maternal Aunt        skin & colon polyps (#/type?)  . Cancer Maternal Uncle        skin  . Cancer Paternal Aunt 44       breast; currently 60  . Migraines Other        paternal side   the patient's mother was diagnosed with cancer of the uterus at age 36, cancer of the breast at age 39, and melanoma at age 55. This may well represent a p53 mutation, but there  are many other possibilities. Unfortunately the patient's mother, who is still living, refuses to be genetically tested. The patient's maternal grandfather had a history of brain and lung cancer (these apparently were 2 separate primaries). He was a smoker. He died at age 16. 103 of the patient's mother's siblings had skin cancers, but not melanomas. On the paternal side the paternal grandmother was diagnosed with colon cancer at age 89 and a paternal aunt was diagnosed with breast cancer at age 49. The patient's father has a history of precancerous polyps.  GYNECOLOGIC HISTORY:  No LMP recorded. Menarche age 88. The patient is GX P0. She understands that waiting after age 7 to carry a child essentially doubles the risk of breast cancer.  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 She also has a second job at [employer]. She is single, and lives alone, with no pets    ADVANCED DIRECTIVES: Not in place. At her 04/26/2014 visit she was given the appropriate documents 2 complete and notarize our her discretion.   HEALTH MAINTENANCE: Social History  Substance Use Topics  . Smoking status: Never Smoker  . Smokeless tobacco: Never Used  . Alcohol use 1.8 oz/week    3 Standard drinks or equivalent per week     Colonoscopy: Never  PAP: March 2015  Bone density: Never  Lipid panel:  Allergies  Allergen Reactions  . Codone [Hydrocodone]   . Penicillins   . Sulfur     Current Outpatient Prescriptions  Medication Sig Dispense Refill  . albuterol (PROAIR HFA) 108 (90 Base) MCG/ACT inhaler Inhale 2 puffs into the lungs every 4 (four) hours as needed for wheezing or shortness of breath (cough). 1 Inhaler 1  . azithromycin (ZITHROMAX Z-PAK) 250 MG tablet Take 2 pills by mouth today and then 1 pill daily for 4 days 6 tablet 0  . benzonatate (TESSALON) 200 MG capsule Take 1 capsule (200 mg total) by mouth 3 (three) times daily as needed  for cough. Swallow whole, do not bite pill 30 capsule 1  . cetirizine (ZYRTEC) 10 MG tablet Take 10 mg by mouth daily.    . fluticasone (FLONASE) 50 MCG/ACT nasal spray INHALE 2 SPRAY BY INTRANASAL ROUTE EVERY DAY IN EACH NOSTRIL  0  . norethindrone-ethinyl estradiol (JUNEL FE 1/20) 1-20 MG-MCG tablet Take 1 tablet by mouth daily. 84 tablet 4  . OnabotulinumtoxinA (BOTOX IJ) Inject as directed. Reported on 02/23/2015    . SUMAtriptan (IMITREX) 100 MG tablet Take 100 mg by mouth every 2 (two) hours as needed for migraine or headache. Reported on 02/23/2015     No current facility-administered medications for this visit.     OBJECTIVE:  Young white woman who appears well  Vitals:   01/02/17 1149  BP: 109/75  Pulse: 71  Resp: 18  Temp: 98.8 F (37.1 C)  SpO2: 100%     Body mass index is 22.86 kg/m.    ECOG FS:0 - Asymptomatic  Sclerae unicteric, pupils round and equal Oropharynx clear and moist No cervical or supraclavicular adenopathy Lungs no rales or rhonchi Heart regular rate and rhythm Abd soft, nontender, positive bowel sounds MSK no focal spinal tenderness, no upper extremity lymphedema Neuro: nonfocal, well oriented, appropriate affect Breasts: In the right breast laterally there is an area of increased density, which was recently evaluated.  Both breasts are lumpy as expected.  There has been no significant change from prior.  Both axillae are benign   LAB RESULTS:  CMP     Component Value Date/Time   NA 140 11/29/2015 1514   K 4.1 11/29/2015 1514   CL 102 06/09/2014 1634   CO2 23 11/29/2015 1514   GLUCOSE 92 11/29/2015 1514   BUN 11.5 11/29/2015 1514   CREATININE 0.8 11/29/2015 1514   CALCIUM 9.4 11/29/2015 1514   PROT 7.2 11/29/2015 1514   ALBUMIN 3.8 11/29/2015 1514   AST 13 11/29/2015 1514   ALT 11 11/29/2015 1514   ALKPHOS 53 11/29/2015 1514   BILITOT 0.32 11/29/2015 1514    INo results found for: SPEP, UPEP  Lab Results  Component Value Date   WBC  8.4 01/02/2017   NEUTROABS 5.8 01/02/2017   HGB 14.1 01/02/2017   HCT 42.4 01/02/2017   MCV 89.4 01/02/2017   PLT 197 01/02/2017      Chemistry      Component Value Date/Time   NA 140 11/29/2015 1514   K 4.1 11/29/2015 1514   CL 102 06/09/2014 1634   CO2 23 11/29/2015 1514   BUN 11.5 11/29/2015 1514   CREATININE 0.8 11/29/2015 1514      Component Value Date/Time   CALCIUM 9.4 11/29/2015 1514   ALKPHOS 53 11/29/2015 1514   AST 13 11/29/2015 1514   ALT 11 11/29/2015 1514   BILITOT 0.32 11/29/2015 1514       No results found for: LABCA2  No components found for: LABCA125  No results for input(s): INR in the last 168 hours.  Urinalysis    Component Value Date/Time   BILIRUBINUR negative 06/14/2014 1630   PROTEINUR negative 06/14/2014 1630   UROBILINOGEN 0.2 06/14/2014 1630   NITRITE negative 06/14/2014 1630   LEUKOCYTESUR large (3+) 06/14/2014 1630    STUDIES: US Breast Ltd Uni Right Inc Axilla  Result Date: 01/01/2017 CLINICAL DATA:  Patient reports a palpable lump lateral aspect the right breast. The patient has a significant family history for breast carcinoma a mother diagnosed in her 42s. EXAM: 2D DIGITAL DIAGNOSTIC BILATERAL MAMMOGRAM WITH CAD AND ADJUNCT TOMO ULTRASOUND RIGHT BREAST COMPARISON:  Previous exam(s). ACR Breast Density Category d: The breast tissue is extremely dense, which lowers the sensitivity of mammography. FINDINGS: There are no discrete masses, areas of architectural distortion, areas of significant asymmetry or suspicious calcifications. Mammographic images were processed with CAD. On physical exam, there is a prominent area of firm tissue in the lateral to upper outer aspect of the right breast corresponding to the reported palpable abnormality. Targeted ultrasound is performed, showing heterogeneous fibroglandular tissue throughout the lateral and upper outer right breast. No discrete mass or suspicious lesion. IMPRESSION: Negative exam. No  evidence of malignancy. The palpable abnormality appears to be a prominent area  of fibroglandular tissue. RECOMMENDATION: 1.  Screening mammogram in one year.(Code:SM-B-01Y) 2. Consider annual breast MRI given the patient's elevated lifetime risk for developing breast carcinoma, particularly given the patient's extremely dense breast appearance mammographically. I have discussed the findings and recommendations with the patient. Results were also provided in writing at the conclusion of the visit. If applicable, a reminder letter will be sent to the patient regarding the next appointment. BI-RADS CATEGORY  1: Negative. Electronically Signed   By: Lajean Manes M.D.   On: 01/01/2017 15:43   Mm Diag Breast Tomo Bilateral  Result Date: 01/01/2017 CLINICAL DATA:  Patient reports a palpable lump lateral aspect the right breast. The patient has a significant family history for breast carcinoma a mother diagnosed in her 68s. EXAM: 2D DIGITAL DIAGNOSTIC BILATERAL MAMMOGRAM WITH CAD AND ADJUNCT TOMO ULTRASOUND RIGHT BREAST COMPARISON:  Previous exam(s). ACR Breast Density Category d: The breast tissue is extremely dense, which lowers the sensitivity of mammography. FINDINGS: There are no discrete masses, areas of architectural distortion, areas of significant asymmetry or suspicious calcifications. Mammographic images were processed with CAD. On physical exam, there is a prominent area of firm tissue in the lateral to upper outer aspect of the right breast corresponding to the reported palpable abnormality. Targeted ultrasound is performed, showing heterogeneous fibroglandular tissue throughout the lateral and upper outer right breast. No discrete mass or suspicious lesion. IMPRESSION: Negative exam. No evidence of malignancy. The palpable abnormality appears to be a prominent area of fibroglandular tissue. RECOMMENDATION: 1.  Screening mammogram in one year.(Code:SM-B-01Y) 2. Consider annual breast MRI given the  patient's elevated lifetime risk for developing breast carcinoma, particularly given the patient's extremely dense breast appearance mammographically. I have discussed the findings and recommendations with the patient. Results were also provided in writing at the conclusion of the visit. If applicable, a reminder letter will be sent to the patient regarding the next appointment. BI-RADS CATEGORY  1: Negative. Electronically Signed   By: Lajean Manes M.D.   On: 01/01/2017 15:43    ASSESSMENT: 36 y.o. [place] woman with a high-risk family history for breast, uterine, melanoma and colon cancers  (1) BRCA 1 and 2 testing 05/07/2010 negative (BARTs not sent)  (2) genetics testing 05/05/2014 through the [facility] gene panel at [facility] showed no deleterious mutations in ATM, BARD1, BRCA1, BRCA2, BRIP1, CDH1, CHEK2, MRE11A, MUTYH, NBN, NF1, PALB2, PTEN, RAD50, RAD51C, RAD51D, and TP53  (3) screening plan: Initiated June 2015  (a) yearly gynecology visits/ Sabra Heck  (b) biannual mammography (in place of yearly MRI)/ [facility]  (c) biannual dermatology screening/ [place]  (d) annual ophthalmology exam/ [place]: Marcea is now a little over3 years into her intensified screening program.  There is no evidence of cancer developing.  She continues with an excellent functional status  The problem is finances.  Ideally we would do a breast MRI yearly, but her co-pay is over thousand dollars and she cannot afford that.  We are trying to do the best we can with by annual mammography, but I am concerned that this does increase the radiation exposure.  In addition she is having a hard time scheduling the mammograms because "you are not yet 39 and it is too soon" as she is told at the breast center by the schedulers.  I am going to ask Dr Autumn Patty who did her most recent mammogram there to take her case in hand if possible and do what he can to facilitate her follow-up  Otherwise she  will see Dr. Sabra Heck again next May or June and will see me again next October.  She knows to call for any problems that may develop before that visit.  Baldemar Dady, Virgie Dad, MD  01/02/17 12:07 PM Medical Oncology and Hematology [facility] [address] [place], [place] [address] Tel. [phone]    Fax. [phone]  This document serves as a record of services personally performed by Chauncey Cruel, MD. It was created on his behalf by Margit Banda, a trained medical scribe. The creation of this record is based on the scribe's personal observations and the provider's statements to them. This document has been checked and approved by the attending provider.

## 2017-01-01 ENCOUNTER — Ambulatory Visit
Admission: RE | Admit: 2017-01-01 | Discharge: 2017-01-01 | Disposition: A | Discharge status: Home / Self Care | Payer: BC Managed Care – PPO | Source: Ambulatory Visit | Attending: Oncology | Admitting: Oncology

## 2017-01-01 ENCOUNTER — Other Ambulatory Visit: Payer: Self-pay

## 2017-01-01 DIAGNOSIS — N6310 Unspecified lump in the right breast, unspecified quadrant: Secondary | ICD-10-CM

## 2017-01-01 DIAGNOSIS — Z1239 Encounter for other screening for malignant neoplasm of breast: Secondary | ICD-10-CM

## 2017-01-02 ENCOUNTER — Ambulatory Visit (HOSPITAL_BASED_OUTPATIENT_CLINIC_OR_DEPARTMENT_OTHER): Payer: BC Managed Care – PPO | Admitting: Oncology

## 2017-01-02 ENCOUNTER — Other Ambulatory Visit (HOSPITAL_BASED_OUTPATIENT_CLINIC_OR_DEPARTMENT_OTHER): Payer: BC Managed Care – PPO

## 2017-01-02 VITALS — BP 109/75 | HR 71 | Temp 98.8°F | Resp 18 | Ht 63.5 in | Wt 131.1 lb

## 2017-01-02 DIAGNOSIS — Z800 Family history of malignant neoplasm of digestive organs: Secondary | ICD-10-CM | POA: Diagnosis not present

## 2017-01-02 DIAGNOSIS — Z803 Family history of malignant neoplasm of breast: Secondary | ICD-10-CM | POA: Diagnosis not present

## 2017-01-02 DIAGNOSIS — Z808 Family history of malignant neoplasm of other organs or systems: Secondary | ICD-10-CM

## 2017-01-02 DIAGNOSIS — Z1239 Encounter for other screening for malignant neoplasm of breast: Secondary | ICD-10-CM

## 2017-01-02 DIAGNOSIS — Z8049 Family history of malignant neoplasm of other genital organs: Secondary | ICD-10-CM

## 2017-01-02 LAB — COMPREHENSIVE METABOLIC PANEL
ALT: 10 U/L (ref 0–55)
AST: 16 U/L (ref 5–34)
Albumin: 3.9 g/dL (ref 3.5–5.0)
Alkaline Phosphatase: 50 U/L (ref 40–150)
Anion Gap: 6 mEq/L (ref 3–11)
BUN: 10.8 mg/dL (ref 7.0–26.0)
CO2: 26 mEq/L (ref 22–29)
Calcium: 9.2 mg/dL (ref 8.4–10.4)
Chloride: 107 mEq/L (ref 98–109)
Creatinine: 0.8 mg/dL (ref 0.6–1.1)
EGFR: >60 mL/min/{1.73_m2} (ref >60)
Glucose: 94 mg/dl (ref 70–140)
Potassium: 4 mEq/L (ref 3.5–5.1)
Sodium: 140 mEq/L (ref 136–145)
Total Bilirubin: 0.4 mg/dL (ref 0.20–1.20)
Total Protein: 6.6 g/dL (ref 6.4–8.3)

## 2017-01-02 LAB — CBC WITH DIFFERENTIAL/PLATELET
BASO%: 0.9 % (ref 0.0–2.0)
Basophils Absolute: 0.1 10*3/uL (ref 0.0–0.1)
EOS%: 0.4 % (ref 0.0–7.0)
Eosinophils Absolute: 0 10*3/uL (ref 0.0–0.5)
HCT: 42.4 % (ref 34.8–46.6)
HGB: 14.1 g/dL (ref 11.6–15.9)
LYMPH%: 24.6 % (ref 14.0–49.7)
MCH: 29.7 pg (ref 25.1–34.0)
MCHC: 33.2 g/dL (ref 31.5–36.0)
MCV: 89.4 fL (ref 79.5–101.0)
MONO#: 0.4 10*3/uL (ref 0.1–0.9)
MONO%: 5.2 % (ref 0.0–14.0)
NEUT#: 5.8 10*3/uL (ref 1.5–6.5)
NEUT%: 68.9 % (ref 38.4–76.8)
Platelets: 197 10*3/uL (ref 145–400)
RBC: 4.75 10*6/uL (ref 3.70–5.45)
RDW: 12.7 % (ref 11.2–14.5)
WBC: 8.4 10*3/uL (ref 3.9–10.3)
lymph#: 2.1 10*3/uL (ref 0.9–3.3)

## 2017-01-06 ENCOUNTER — Telehealth: Payer: Self-pay | Admitting: Oncology

## 2017-01-06 NOTE — Telephone Encounter (Signed)
Scheduled appt per 11/1 los. Left voicemail for patient regarding these appts.

## 2017-01-07 ENCOUNTER — Ambulatory Visit (INDEPENDENT_AMBULATORY_CARE_PROVIDER_SITE_OTHER): Payer: BC Managed Care – PPO

## 2017-01-07 DIAGNOSIS — Z23 Encounter for immunization: Secondary | ICD-10-CM

## 2017-01-09 ENCOUNTER — Telehealth: Payer: Self-pay | Admitting: *Deleted

## 2017-01-09 ENCOUNTER — Ambulatory Visit: Payer: BC Managed Care – PPO

## 2017-01-09 NOTE — Telephone Encounter (Signed)
Called pt and informed about resources for pt that are at high risk for breast cancer. Emailed pt information received from SW

## 2017-01-13 NOTE — Telephone Encounter (Signed)
Opened in error

## 2017-02-06 ENCOUNTER — Ambulatory Visit: Payer: BC Managed Care – PPO | Admitting: Internal Medicine

## 2017-02-06 ENCOUNTER — Encounter: Payer: Self-pay | Admitting: Internal Medicine

## 2017-02-06 VITALS — BP 110/76 | HR 63 | Temp 98.0°F | Wt 134.0 lb

## 2017-02-06 DIAGNOSIS — J329 Chronic sinusitis, unspecified: Secondary | ICD-10-CM | POA: Diagnosis not present

## 2017-02-06 DIAGNOSIS — B9789 Other viral agents as the cause of diseases classified elsewhere: Secondary | ICD-10-CM

## 2017-02-06 NOTE — Progress Notes (Signed)
HPI  Pt presents to the clinic today with c/o nasal congestion, ear pain and sore throat. This started 3 weeks ago. She is blowing yellow mucous out of her nose. She describes the ear pain as sharp and stabbing. She denies decreased hearing. She denies difficulty swallowing. She denies fever, chills or body aches. She has tried Nyquil, Dayquil, Zyrtec, Mucinex and Emergen C with minimal relief. She has a history of allergies. She has not had sick contacts that she is aware of.  Review of Systems     Past Medical History:  Diagnosis Date  . Allergy   . Depression   . Family history of malignant neoplasm of breast   . Family history of uterine cancer   . Migraine   . UTI (lower urinary tract infection)     Family History  Problem Relation Age of Onset  . Cancer Mother        uterine @ 63, breast @ 53, and melanoma @ 7  . Hypertension Mother   . Asthma Mother   . Arrhythmia Mother   . Hyperlipidemia Father   . Hypertension Father   . Diabetes Father   . Heart block Father        had angioplasty  . Cancer Maternal Grandfather        lung; smoker; deceased 6  . Cancer Paternal Grandmother 66       colon cancer; deceased 20  . Alcohol abuse Paternal Grandfather   . Cancer Maternal Aunt        skin & colon polyps (#/type?)  . Cancer Maternal Uncle        skin  . Cancer Paternal Aunt 64       breast; currently 41  . Migraines Other        paternal side    Social History   Socioeconomic History  . Marital status: Single    Spouse name: Not on file  . Number of children: Not on file  . Years of education: Not on file  . Highest education level: Not on file  Social Needs  . Financial resource strain: Not on file  . Food insecurity - worry: Not on file  . Food insecurity - inability: Not on file  . Transportation needs - medical: Not on file  . Transportation needs - non-medical: Not on file  Occupational History  . Not on file  Tobacco Use  . Smoking status: Never  Smoker  . Smokeless tobacco: Never Used  Substance and Sexual Activity  . Alcohol use: Yes    Alcohol/week: 1.8 oz    Types: 3 Standard drinks or equivalent per week  . Drug use: No  . Sexual activity: Yes    Partners: Male    Birth control/protection: Pill    Comment: Microgestin  Other Topics Concern  . Not on file  Social History Narrative  . Not on file    Allergies  Allergen Reactions  . Codone [Hydrocodone]   . Penicillins   . Sulfur      Constitutional: Denies headache, fatigue, fever or abrupt weight changes.  HEENT:  Positive ear pain, nasal congestion and sore throat. Denies eye redness, ringing in the ears, wax buildup, runny nose or bloody nose. Respiratory:  Denies cough, difficulty breathing or shortness of breath.  Cardiovascular: Denies chest pain, chest tightness, palpitations or swelling in the hands or feet.   No other specific complaints in a complete review of systems (except as listed in HPI above).  Objective:   BP 110/76   Pulse 63   Temp 98 F (36.7 C) (Oral)   Wt 134 lb (60.8 kg)   LMP 01/21/2017   SpO2 98%   BMI 23.36 kg/m   General: Appears her stated age, in NAD. HEENT: Head: normal shape and size, no sinus tenderness noted; Ears: Tm's gray and intact, normal light reflex; Nose: mucosa boggy and moist, septum midline; Throat/Mouth: + PND. Teeth present, mucosa pink and moist, no exudate noted, no lesions or ulcerations noted.  Neck:  No adenopathy noted.  Pulmonary/Chest: Normal effort and positive vesicular breath sounds. No respiratory distress. No wheezes, rales or ronchi noted.       Assessment & Plan:   Viral Sinusitis  Can use a Neti Pot which can be purchased from your local drug store. Flonase 2 sprays each nostril for 3 days and then as needed. 80 mg Depo IM today Continue Zyrtec No indication for abx at this time  RTC as needed or if symptoms persist. Webb Silversmith, NP

## 2017-02-06 NOTE — Patient Instructions (Signed)

## 2017-04-15 ENCOUNTER — Encounter: Payer: Self-pay | Admitting: Family Medicine

## 2017-04-15 ENCOUNTER — Ambulatory Visit: Payer: BC Managed Care – PPO | Admitting: Family Medicine

## 2017-04-15 VITALS — BP 128/82 | HR 92 | Temp 98.8°F | Ht 63.5 in | Wt 134.5 lb

## 2017-04-15 DIAGNOSIS — J0100 Acute maxillary sinusitis, unspecified: Secondary | ICD-10-CM | POA: Diagnosis not present

## 2017-04-15 MED ORDER — FLUTICASONE PROPIONATE 50 MCG/ACT NA SUSP: 2.0000 | Freq: Every day | NASAL | 3 refills | Status: DC

## 2017-04-15 MED ORDER — AZITHROMYCIN 250 MG PO TABS: ORAL_TABLET | ORAL | 0 refills | Status: DC

## 2017-04-15 MED ORDER — BENZONATATE 200 MG PO CAPS: 200.0000 mg | ORAL_CAPSULE | Freq: Three times a day (TID) | ORAL | 1 refills | Status: DC | PRN

## 2017-04-15 NOTE — Patient Instructions (Signed)
For sinus infection-take the zpack as directed For cough tessalon  Also mucinex DM (max)  Drink lots of fluids Get back on flonase Breathe steam   Update if not starting to improve in a week or if worsening

## 2017-04-15 NOTE — Progress Notes (Signed)
Subjective:    Patient ID: Kirsten Wilson, female    DOB: 21-Jun-1980, 37 y.o.   MRN: [id number]  HPI Here for uri symptoms for 3 weeks   Started with sinus pressure and congestion  Used mucinex and emergen-C Got a little better Starting Friday-much worse  Throat is very sore/ pnd is very irritating (much worse at night)   Cough - dry for the most part   Nasal d/c is white/yellow  Sinus pressure-not severe but still there  Worse on the R side  That side of throat is worse also  No fever Temp: 98.8 F (37.1 C)   Patient Active Problem List   Diagnosis Date Noted  . Acute sinusitis 11/11/2016  . 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 Cervical polyp 08/26/2014  . Urinary frequency 06/14/2014  . Genetic testing 05/27/2014  . Family history of uterine cancer   . Breast cancer screening, high risk patient 03/30/2014  . Allergic rhinitis 03/30/2014  . Migraine 03/30/2014  . History of depression 03/30/2014  . Family history of breast cancer 03/30/2014   Past Medical History:  Diagnosis Date  . Allergy   . Depression   . Family history of malignant neoplasm of breast   . Family history of uterine cancer   . Migraine   . UTI (lower urinary tract infection)    Past Surgical History:  Procedure Laterality Date  . BREAST BIOPSY  2011   benign  . BREAST EXCISIONAL BIOPSY Left   . TONSILLECTOMY AND ADENOIDECTOMY  1993   Social History   Tobacco Use  . Smoking status: Never Smoker  . Smokeless tobacco: Never Used  Substance Use Topics  . Alcohol use: Yes    Alcohol/week: 1.8 oz    Types: 3 Standard drinks or equivalent per week  . Drug use: No   Family History  Problem Relation Age of Onset  . Cancer Mother        uterine @ 56, breast @ 53, and melanoma @ 83  . Hypertension Mother   . Asthma Mother   . Arrhythmia Mother   . Hyperlipidemia Father   . Hypertension Father   . Diabetes Father   . Heart block Father        had angioplasty  . Cancer Maternal  Grandfather        lung; smoker; deceased 68  . Cancer Paternal Grandmother 4       colon cancer; deceased 48  . Alcohol abuse Paternal Grandfather   . Cancer Maternal Aunt        skin & colon polyps (#/type?)  . Cancer Maternal Uncle        skin  . Cancer Paternal Aunt 5       breast; currently 80  . Migraines Other        paternal side   Allergies  Allergen Reactions  . Codone [Hydrocodone]   . Penicillins   . Sulfur    Current Outpatient Medications on File Prior to Visit  Medication Sig Dispense Refill  . cetirizine (ZYRTEC) 10 MG tablet Take 10 mg by mouth daily.    . norethindrone-ethinyl estradiol (JUNEL FE 1/20) 1-20 MG-MCG tablet Take 1 tablet by mouth daily. 84 tablet 4  . OnabotulinumtoxinA (BOTOX IJ) Inject as directed. Reported on 02/23/2015    . SUMAtriptan (IMITREX) 100 MG tablet Take 100 mg by mouth every 2 (two) hours as needed for migraine or headache. Reported on 02/23/2015    . albuterol (PROAIR HFA) 108 (90  Base) MCG/ACT inhaler Inhale 2 puffs into the lungs every 4 (four) hours as needed for wheezing or shortness of breath (cough). (Patient not taking: Reported on 04/15/2017) 1 Inhaler 1   No current facility-administered medications on file prior to visit.      Review of Systems  Constitutional: Positive for appetite change and fatigue. Negative for fever.  HENT: Positive for congestion, ear pain, postnasal drip, rhinorrhea, sinus pressure, sinus pain and sore throat. Negative for nosebleeds.   Eyes: Negative for pain, redness and itching.  Respiratory: Positive for cough. Negative for shortness of breath and wheezing.   Cardiovascular: Negative for chest pain.  Gastrointestinal: Negative for abdominal pain, diarrhea, nausea and vomiting.  Endocrine: Negative for polyuria.  Genitourinary: Negative for dysuria, frequency and urgency.  Musculoskeletal: Negative for arthralgias and myalgias.  Allergic/Immunologic: Negative for immunocompromised state.    Neurological: Positive for headaches. Negative for dizziness, tremors, syncope, weakness and numbness.  Hematological: Negative for adenopathy. Does not bruise/bleed easily.  Psychiatric/Behavioral: Negative for dysphoric mood. The patient is not nervous/anxious.        Objective:   Physical Exam  Constitutional: She appears well-developed and well-nourished. No distress.  HENT:  Head: Normocephalic and atraumatic.  Right Ear: External ear normal.  Left Ear: External ear normal.  Mouth/Throat: Oropharynx is clear and moist. No oropharyngeal exudate.  Nares are injected and congested  Bilateral maxillary sinus tenderness / mild frontal sinus tenderness  Post nasal drip   Eyes: Conjunctivae and EOM are normal. Pupils are equal, round, and reactive to light. Right eye exhibits no discharge. Left eye exhibits no discharge.  Neck: Normal range of motion. Neck supple.  Cardiovascular: Normal rate and regular rhythm.  Pulmonary/Chest: Effort normal and breath sounds normal. No respiratory distress. She has no wheezes. She has no rales.  Good air exch No rales or rhonchi  Lymphadenopathy:    She has no cervical adenopathy.  Neurological: She is alert. No cranial nerve deficit.  Skin: Skin is warm and dry. No rash noted.  Psychiatric: She has a normal mood and affect.          Assessment & Plan:   Problem List Items Addressed This Visit      Respiratory   Acute sinusitis    S/p 3 week uri  Cover with zpak (pcn all)  Tessalon for cough  Fluids/rest mucinex DM otc  Disc symptomatic care - see instructions on AVS Update if not starting to improve in a week or if worsening    Meds ordered this encounter  Medications  . azithromycin (ZITHROMAX Z-PAK) 250 MG tablet    Sig: Take 2 pills by mouth today and then 1 pill daily for 4 days    Dispense:  6 tablet    Refill:  0  . benzonatate (TESSALON) 200 MG capsule    Sig: Take 1 capsule (200 mg total) by mouth 3 (three) times  daily as needed for cough. Swallow whole, do not bite pill    Dispense:  30 capsule    Refill:  1  . fluticasone (FLONASE) 50 MCG/ACT nasal spray    Sig: Place 2 sprays into both nostrils daily.    Dispense:  16 g    Refill:  3         Relevant Medications   azithromycin (ZITHROMAX Z-PAK) 250 MG tablet   benzonatate (TESSALON) 200 MG capsule   fluticasone (FLONASE) 50 MCG/ACT nasal spray

## 2017-04-15 NOTE — Assessment & Plan Note (Addendum)
S/p 3 week uri  Cover with zpak (pcn all)  Tessalon for cough  Fluids/rest mucinex DM otc  Disc symptomatic care - see instructions on AVS Update if not starting to improve in a week or if worsening    Meds ordered this encounter  Medications  . azithromycin (ZITHROMAX Z-PAK) 250 MG tablet    Sig: Take 2 pills by mouth today and then 1 pill daily for 4 days    Dispense:  6 tablet    Refill:  0  . benzonatate (TESSALON) 200 MG capsule    Sig: Take 1 capsule (200 mg total) by mouth 3 (three) times daily as needed for cough. Swallow whole, do not bite pill    Dispense:  30 capsule    Refill:  1  . fluticasone (FLONASE) 50 MCG/ACT nasal spray    Sig: Place 2 sprays into both nostrils daily.    Dispense:  16 g    Refill:  3

## 2017-06-06 ENCOUNTER — Encounter: Payer: Self-pay | Admitting: Family Medicine

## 2017-06-06 ENCOUNTER — Ambulatory Visit: Payer: BC Managed Care – PPO | Admitting: Family Medicine

## 2017-06-06 VITALS — BP 106/70 | HR 62 | Temp 98.6°F | Ht 63.5 in | Wt 136.5 lb

## 2017-06-06 DIAGNOSIS — F430 Acute stress reaction: Secondary | ICD-10-CM

## 2017-06-06 DIAGNOSIS — F411 Generalized anxiety disorder: Secondary | ICD-10-CM | POA: Diagnosis not present

## 2017-06-06 MED ORDER — ESCITALOPRAM OXALATE 20 MG PO TABS: 20.0000 mg | ORAL_TABLET | Freq: Every day | ORAL | 11 refills | Status: DC

## 2017-06-06 NOTE — Patient Instructions (Addendum)
Writing in a journal really helps   Look up the app called "smiling mind" for meditation and mindfulness  Keep running  Keep talking to friends   Let's try going back to lexapro  Start with 10 mg daily and then increase to 20 mg daily after 1-2 weeks (start with 1/2 tablet a day and then go up to a whole)   If worse or any suicidal thoughts - stop the medicine and call us   Take care of yourself   If you become interested in counseling please let me know

## 2017-06-06 NOTE — Progress Notes (Signed)
Subjective:    Patient ID: Kirsten Wilson, female    DOB: [birth year], 37 y.o.   MRN: [id number]  HPI  Here for c/o anxiety symptoms   Wt Readings from Last 3 Encounters:  06/06/17 136 lb 8 oz (61.9 kg)  04/15/17 134 lb 8 oz (61 kg)  02/06/17 134 lb (60.8 kg)   23.80 kg/m    Stressors  Father needs cabg- will go soon to help out   (in [place]) - will stay 3 weeks  Expecting a tough experience  He has also had cva and vision loss   Also deadlines for school hanging over her  Very little time for self care    Very anxious  Poor sleep (wakes up early)- 1:30   3-5 hours of sleep per night and needs 7 -wakes up with busy brain  Appetite is fair-not very hungry but eating   No depression  Not crying or neg thoughts  No desire to harm self  No hopelessness Is very motivated   Has not had any counseling  Has helped in the distant past  Has some coping techniques   Coping - running really helps/ will be able to do more of that  Has a friend to vent  Not a Probation officer  She has had biofeedback in the past- and tries to do her breathing   in the past she took lexapro  Did ok on that    Patient Active Problem List   Diagnosis Date Noted  . Anxiety as acute reaction to gross stress 06/06/2017  . 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 Cervical polyp 08/26/2014  . Urinary frequency 06/14/2014  . Genetic testing 05/27/2014  . Family history of uterine cancer   . Breast cancer screening, high risk patient 03/30/2014  . Allergic rhinitis 03/30/2014  . Migraine 03/30/2014  . History of depression 03/30/2014  . Family history of breast cancer 03/30/2014   Past Medical History:  Diagnosis Date  . Allergy   . Depression   . Family history of malignant neoplasm of breast   . Family history of uterine cancer   . Migraine   . UTI (lower urinary tract infection)    Past Surgical History:  Procedure Laterality Date  . BREAST BIOPSY  2011   benign  . BREAST EXCISIONAL BIOPSY Left     . TONSILLECTOMY AND ADENOIDECTOMY  1993   Social History   Tobacco Use  . Smoking status: Never Smoker  . Smokeless tobacco: Never Used  Substance Use Topics  . Alcohol use: Yes    Alcohol/week: 1.8 oz    Types: 3 Standard drinks or equivalent per week  . Drug use: No   Family History  Problem Relation Age of Onset  . Cancer Mother        uterine @ 28, breast @ 30, and melanoma @ 13  . Hypertension Mother   . Asthma Mother   . Arrhythmia Mother   . Hyperlipidemia Father   . Hypertension Father   . Diabetes Father   . Heart block Father        had angioplasty  . Cancer Maternal Grandfather        lung; smoker; deceased 53  . Cancer Paternal Grandmother 82       colon cancer; deceased 59  . Alcohol abuse Paternal Grandfather   . Cancer Maternal Aunt        skin & colon polyps (#/type?)  . Cancer Maternal Uncle  skin  . Cancer Paternal Aunt 69       breast; currently 71  . Migraines Other        paternal side   Allergies  Allergen Reactions  . Codone [Hydrocodone]   . Penicillins   . Sulfur    Current Outpatient Medications on File Prior to Visit  Medication Sig Dispense Refill  . albuterol (PROAIR HFA) 108 (90 Base) MCG/ACT inhaler Inhale 2 puffs into the lungs every 4 (four) hours as needed for wheezing or shortness of breath (cough). 1 Inhaler 1  . cetirizine (ZYRTEC) 10 MG tablet Take 10 mg by mouth daily.    . fluticasone (FLONASE) 50 MCG/ACT nasal spray Place 2 sprays into both nostrils daily. 16 g 3  . norethindrone-ethinyl estradiol (JUNEL FE 1/20) 1-20 MG-MCG tablet Take 1 tablet by mouth daily. 84 tablet 4  . OnabotulinumtoxinA (BOTOX IJ) Inject as directed. Reported on 02/23/2015    . SUMAtriptan (IMITREX) 100 MG tablet Take 100 mg by mouth every 2 (two) hours as needed for migraine or headache. Reported on 02/23/2015     No current facility-administered medications on file prior to visit.      Review of Systems  Constitutional: Negative for  activity change, appetite change, fatigue, fever and unexpected weight change.  HENT: Negative for congestion, ear pain, rhinorrhea, sinus pressure and sore throat.   Eyes: Negative for pain, redness and visual disturbance.  Respiratory: Negative for cough, shortness of breath and wheezing.   Cardiovascular: Negative for chest pain and palpitations.  Gastrointestinal: Negative for abdominal pain, blood in stool, constipation and diarrhea.  Endocrine: Negative for polydipsia and polyuria.  Genitourinary: Negative for dysuria, frequency and urgency.  Musculoskeletal: Negative for arthralgias, back pain and myalgias.  Skin: Negative for pallor and rash.  Allergic/Immunologic: Negative for environmental allergies.  Neurological: Negative for dizziness, syncope and headaches.  Hematological: Negative for adenopathy. Does not bruise/bleed easily.  Psychiatric/Behavioral: Positive for decreased concentration and dysphoric mood. Negative for self-injury and suicidal ideas. The patient is nervous/anxious.        Objective:   Physical Exam  Constitutional: She appears well-developed and well-nourished. No distress.  HENT:  Head: Normocephalic and atraumatic.  Mouth/Throat: Oropharynx is clear and moist.  Eyes: Pupils are equal, round, and reactive to light. Conjunctivae and EOM are normal. No scleral icterus.  Neck: Normal range of motion. Neck supple. No thyromegaly present.  Cardiovascular: Normal rate, regular rhythm and normal heart sounds.  Pulmonary/Chest: Effort normal and breath sounds normal. No respiratory distress. She has no wheezes. She has no rales.  Lymphadenopathy:    She has no cervical adenopathy.  Neurological: She is alert. No cranial nerve deficit. She exhibits normal muscle tone. Coordination normal.  Skin: Skin is warm and dry. No rash noted. No erythema. No pallor.  Psychiatric: Her speech is normal and behavior is normal. Thought content normal. Her mood appears anxious.  Her affect is not blunt, not labile and not inappropriate. Thought content is not paranoid. She does not exhibit a depressed mood. She expresses no homicidal and no suicidal ideation.  Not tearful  Talks candidly about stressors          Assessment & Plan:   Problem List Items Addressed This Visit      Other   Anxiety as acute reaction to gross stress - Primary    Reviewed stressors/ coping techniques/symptoms/ support sources/ tx options and side effects in detail today  Will go back on lexapro at 10 and  titrate to 20  Has done well with this before Discussed expectations of SSRI medication including time to effectiveness and mechanism of action, also poss of side effects (early and late)- including mental fuzziness, weight or appetite change, nausea and poss of worse dep or anxiety (even suicidal thoughts)  Pt voiced understanding and will stop med and update if this occurs  >25 minutes spent in face to face time with patient, >50% spent in counselling or coordination of care including disc self care and exercise and sleep Meditation/mindfulness  F/u planned  Not interested in counseling at this time but may try later        Relevant Medications   escitalopram (LEXAPRO) 20 MG tablet

## 2017-06-08 NOTE — Assessment & Plan Note (Addendum)
Reviewed stressors/ coping techniques/symptoms/ support sources/ tx options and side effects in detail today  Will go back on lexapro at 10 and titrate to 20  Has done well with this before Discussed expectations of SSRI medication including time to effectiveness and mechanism of action, also poss of side effects (early and late)- including mental fuzziness, weight or appetite change, nausea and poss of worse dep or anxiety (even suicidal thoughts)  Pt voiced understanding and will stop med and update if this occurs  >25 minutes spent in face to face time with patient, >50% spent in counselling or coordination of care including disc self care and exercise and sleep Meditation/mindfulness  F/u planned  Not interested in counseling at this time but may try later

## 2017-08-21 ENCOUNTER — Other Ambulatory Visit (HOSPITAL_COMMUNITY)
Admission: RE | Admit: 2017-08-21 | Discharge: 2017-08-21 | Disposition: A | Discharge status: Home / Self Care | Payer: BC Managed Care – PPO | Source: Ambulatory Visit | Attending: Obstetrics & Gynecology | Admitting: Obstetrics & Gynecology

## 2017-08-21 ENCOUNTER — Encounter: Payer: Self-pay | Admitting: Obstetrics & Gynecology

## 2017-08-21 ENCOUNTER — Other Ambulatory Visit: Payer: Self-pay

## 2017-08-21 ENCOUNTER — Ambulatory Visit (INDEPENDENT_AMBULATORY_CARE_PROVIDER_SITE_OTHER): Payer: BC Managed Care – PPO | Admitting: Obstetrics & Gynecology

## 2017-08-21 VITALS — BP 122/80 | HR 64 | Resp 16 | Ht 63.75 in | Wt 137.0 lb

## 2017-08-21 DIAGNOSIS — Z01419 Encounter for gynecological examination (general) (routine) without abnormal findings: Secondary | ICD-10-CM

## 2017-08-21 DIAGNOSIS — Z8049 Family history of malignant neoplasm of other genital organs: Secondary | ICD-10-CM

## 2017-08-21 DIAGNOSIS — Z124 Encounter for screening for malignant neoplasm of cervix: Secondary | ICD-10-CM | POA: Insufficient documentation

## 2017-08-21 MED ORDER — NORETHIN ACE-ETH ESTRAD-FE 1-20 MG-MCG PO TABS: 1.0000 | ORAL_TABLET | Freq: Every day | ORAL | 4 refills | Status: DC

## 2017-08-21 NOTE — Addendum Note (Signed)
Addended by: Burnice Logan on: 08/21/2017 10:36 AM   Modules accepted: Orders

## 2017-08-21 NOTE — Progress Notes (Signed)
37 y.o. G0P0000 SingleCaucasianF here for annual exam.  Had some family stressors this year.  Father had a CABG, 3 vessel.  Was in the ICU for five days.  He is finally doing better.  Surgery was about 2 months.  He is in [place] so she went home to help her mom.    Cycles are regular but she missed the April's cycle which she attributes to stress.  She reports her cycles are lighter but she has lost about 10 pounds.  She is running much more regularly.    Patient's last menstrual period was 07/30/2017.          Sexually active: No.  The current method of family planning is OCP (estrogen/progesterone).    Exercising: Yes.    run Smoker:  no  Health Maintenance: Pap:  08/19/16 Neg   08/14/15 ASCUS. HR HPV:Neg  History of abnormal Pap:  no MMG:  01/01/17 [place] right BIRADS1:Neg  Colonoscopy:  Never BMD: Never TDaP:  2016 Screening Labs: PCP   reports that she has never smoked. She has never used smokeless tobacco. She reports that she drinks about 1.8 oz of alcohol per week. She reports that she does not use drugs.  Past Medical History:  Diagnosis Date  . Allergy   . Depression   . Family history of malignant neoplasm of breast   . Family history of uterine cancer   . Migraine   . UTI (lower urinary tract infection)     Past Surgical History:  Procedure Laterality Date  . BREAST BIOPSY  2011   benign  . BREAST EXCISIONAL BIOPSY Left   . TONSILLECTOMY AND ADENOIDECTOMY  1993    Current Outpatient Medications  Medication Sig Dispense Refill  . albuterol (PROAIR HFA) 108 (90 Base) MCG/ACT inhaler Inhale 2 puffs into the lungs every 4 (four) hours as needed for wheezing or shortness of breath (cough). 1 Inhaler 1  . cetirizine (ZYRTEC) 10 MG tablet Take 10 mg by mouth daily.    Marland Kitchen escitalopram (LEXAPRO) 20 MG tablet Take 1 tablet (20 mg total) by mouth daily. 30 tablet 11  . fluticasone (FLONASE) 50 MCG/ACT nasal spray Place 2 sprays into both nostrils daily. 16 g 3  .  norethindrone-ethinyl estradiol (JUNEL FE 1/20) 1-20 MG-MCG tablet Take 1 tablet by mouth daily. 84 tablet 4  . OnabotulinumtoxinA (BOTOX IJ) Inject as directed. Reported on 02/23/2015    . SUMAtriptan (IMITREX) 100 MG tablet Take 100 mg by mouth every 2 (two) hours as needed for migraine or headache. Reported on 02/23/2015     No current facility-administered medications for this visit.     Family History  Problem Relation Age of Onset  . Cancer Mother        uterine @ 29, breast @ 42, and melanoma @ 92  . Hypertension Mother   . Asthma Mother   . Arrhythmia Mother   . Hyperlipidemia Father   . Hypertension Father   . Diabetes Father   . Heart block Father        had angioplasty  . Cancer Maternal Grandfather        lung; smoker; deceased 66  . Cancer Paternal Grandmother 56       colon cancer; deceased 36  . Alcohol abuse Paternal Grandfather   . Cancer Maternal Aunt        skin & colon polyps (#/type?)  . Cancer Maternal Uncle        skin  . Cancer Paternal  Aunt 54       breast; currently 28  . Migraines Other        paternal side    Review of Systems  Genitourinary:       Menstrual cycle changes   All other systems reviewed and are negative.   Exam:   BP 122/80 (BP Location: Right Arm, Patient Position: Sitting, Cuff Size: Normal)   Pulse 64   Resp 16   Ht 5' 3.75" (1.619 m)   Wt 137 lb (62.1 kg)   LMP 07/30/2017   BMI 23.70 kg/m    Height: 5' 3.75" (161.9 cm)  Ht Readings from Last 3 Encounters:  08/21/17 5' 3.75" (1.619 m)  06/06/17 5' 3.5" (1.613 m)  04/15/17 5' 3.5" (1.613 m)    General appearance: alert, cooperative and appears stated age Head: Normocephalic, without obvious abnormality, atraumatic Neck: no adenopathy, supple, symmetrical, trachea midline and thyroid normal to inspection and palpation Lungs: clear to auscultation bilaterally Breasts: normal appearance, no masses or tenderness Heart: regular rate and rhythm Abdomen: soft,  non-tender; bowel sounds normal; no masses,  no organomegaly Extremities: extremities normal, atraumatic, no cyanosis or edema Skin: Skin color, texture, turgor normal. No rashes or lesions Lymph nodes: Cervical, supraclavicular, and axillary nodes normal. No abnormal inguinal nodes palpated Neurologic: Grossly normal   Pelvic: External genitalia:  no lesions              Urethra:  normal appearing urethra with no masses, tenderness or lesions              Bartholins and Skenes: normal                 Vagina: normal appearing vagina with normal color and discharge, no lesions              Cervix: no lesions              Pap taken: Yes.   Bimanual Exam:  Uterus:  normal size, contour, position, consistency, mobility, non-tender              Adnexa: normal adnexa and no mass, fullness, tenderness               Rectovaginal: Confirms               Anus:  normal sphincter tone, no lesions  Chaperone was present for exam.  A:  Well Woman with normal exam Strong family hx of breast, uterine (mother diagnosed at age 60) and melanoma Paternal family hx of colon cancer--paternal GM  P:   Mammogram every six months.  Yearly MRI recommended but has not been able to do this due to cost pap smear obtained.  Will repeat HR HPV next year Return for PUS Rx for microgestin 1/20 Fe daily.  #90 day supply/4RF return annually or prn

## 2017-08-21 NOTE — Progress Notes (Signed)
Patient scheduled while in office for PUS on 09/18/17 at 1pm, consult to follow at 1:30pm with Dr. Sabra Heck. Patient agreeable to date and time.   Order placed for PUS.

## 2017-08-22 LAB — CYTOLOGY - PAP: Diagnosis: NEGATIVE

## 2017-09-18 ENCOUNTER — Ambulatory Visit: Payer: BC Managed Care – PPO | Admitting: Obstetrics & Gynecology

## 2017-09-18 ENCOUNTER — Ambulatory Visit (INDEPENDENT_AMBULATORY_CARE_PROVIDER_SITE_OTHER): Payer: BC Managed Care – PPO

## 2017-09-18 VITALS — BP 102/68 | HR 60 | Resp 16 | Ht 63.75 in | Wt 138.0 lb

## 2017-09-18 DIAGNOSIS — Z8049 Family history of malignant neoplasm of other genital organs: Secondary | ICD-10-CM

## 2017-09-18 NOTE — Progress Notes (Signed)
37 y.o. G0P0000 Single Caucasian female here for pelvic ultrasound due to family hx of endometrial cancer in her mother.  Genetic testing has been negative.  Pt did not go for six month follow up MMG thus far this year.  Has upcoming appt with Dr. Jana Hakim.  Will go for MMG prior to appt, at least.  Cycles are regular and light.  On OCPs..  Patient's last menstrual period was 09/01/2017.  Contraception: OCP  Findings:  UTERUS: 6.5 x 5.0 x 3.5cm EMS: 2.55mm ADNEXA: Left ovary:  1.9 x 0.8 x 0.7cm       Right ovary:  2.3 x 1.5 x 1.4cm CUL DE SAC:  No free fluid  Discussion:  Findings reviewed.  Images reviewed.  Will plan to repeat again next year after AEX.  Questions answered.  Assessment:  Strong family hx of endometrial cancer  Plan:  Continue OCPs, repeat PUS 1 year  ~15 minutes spent with patient >50% of time was in face to face discussion of above.

## 2017-09-21 ENCOUNTER — Encounter: Payer: Self-pay | Admitting: Obstetrics & Gynecology

## 2017-11-05 DIAGNOSIS — C4491 Basal cell carcinoma of skin, unspecified: Secondary | ICD-10-CM

## 2017-11-05 HISTORY — DX: Basal cell carcinoma of skin, unspecified: C44.91

## 2017-11-12 ENCOUNTER — Telehealth: Payer: BC Managed Care – PPO | Admitting: Family

## 2017-11-12 DIAGNOSIS — J019 Acute sinusitis, unspecified: Secondary | ICD-10-CM

## 2017-11-12 MED ORDER — DOXYCYCLINE HYCLATE 100 MG PO TABS
100.0000 mg | ORAL_TABLET | Freq: Two times a day (BID) | ORAL | 0 refills | Status: DC
Start: 2017-11-12 — End: 2018-08-31

## 2017-11-12 NOTE — Progress Notes (Signed)

## 2017-11-24 ENCOUNTER — Other Ambulatory Visit: Payer: Self-pay | Admitting: Oncology

## 2017-11-24 DIAGNOSIS — Z1231 Encounter for screening mammogram for malignant neoplasm of breast: Secondary | ICD-10-CM

## 2017-12-26 ENCOUNTER — Other Ambulatory Visit: Payer: Self-pay | Admitting: Family Medicine

## 2018-01-05 ENCOUNTER — Other Ambulatory Visit: Payer: Self-pay

## 2018-01-05 ENCOUNTER — Ambulatory Visit
Admission: RE | Admit: 2018-01-05 | Discharge: 2018-01-05 | Disposition: A | Discharge status: Home / Self Care | Payer: BC Managed Care – PPO | Source: Ambulatory Visit | Attending: Oncology | Admitting: Oncology

## 2018-01-05 DIAGNOSIS — Z1231 Encounter for screening mammogram for malignant neoplasm of breast: Secondary | ICD-10-CM

## 2018-01-05 DIAGNOSIS — Z1239 Encounter for other screening for malignant neoplasm of breast: Secondary | ICD-10-CM

## 2018-01-05 NOTE — Progress Notes (Signed)
[place]  [phone] [phone]     ID: Jenavive Lamboy DOB: 1981-01-25  MR#: [id number]  AGT#:[id number]  Patient Care Team: Abner Greenspan, MD as PCP - General (Family Medicine) Sireen Halk, Virgie Dad, MD as Consulting Physician (Oncology) Megan Salon, MD as Consulting Physician (Gynecology) Laurence Ferrari, [place], MD as Consulting Physician (Dermatology) PCP: Abner Greenspan, MD OTHER MD:   CHIEF COMPLAINT: high risk family history for breast, uterine and skin cancer  CURRENT TREATMENT: Intensified screening   HISTORY OF PRESENT ILLNESS: From the original intake note:  Timarie moved to this in the area July 2015, from [place]. She had previously lived here. In [place] she was followed by oncology, dermatology, and gynecology for a family history strongly suggestive of a genetic mutation putting her at increased risk for melanoma, endometrial and breast cancers (her mother had all three cancers at an early age). She did meet with genetic counseling there and on 05/07/2010 was tested for the BRCA1 and BRCA2 genes, with no mutation detected. A broader panel or BART testing were not performed.  She now presents to our high Risk clinic for futher evaluation and follow-up  INTERVAL HISTORY: Ziasia returns today for follow-up of her intensified screening program given her high risk of breast cancer.  We initially considered doing mammography every 6 months since she cannot afford a breast MRI yearly, which of course would be preferable.  However we became concerned regarding the high amount of radiation to the breast this would entail.  Accordingly she is having mammography and yearly now.    Since our last visit one year ago, she had a mammogram on 01/05/2018, but results are pending.  She also gets yearly pelvic ultrasonography through Dr. Sabra Heck we are scheduling our visits about 6 months apart to optimize the screening process. Last Pelvic US was on  09/18/2017.  We had referred her to 1 of our dermatologist here but she was not able to get into his office.  She tells me she did see Dr. 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  She will be following up yearly with her   REVIEW OF SYSTEMS: Zlata states that her father recently had a heart attack and a bypass surgery. He is now doing well.  She had to go to [place] for this. 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 She denies unusual headaches, visual changes, nausea, vomiting, or dizziness.  She walks about 11,000 steps a day at work and she also runs about 3 times a week.  There has been no unusual cough, phlegm production, or pleurisy. This been no change in bowel or bladder habits. She denies unexplained fatigue or unexplained weight loss, bleeding, rash, or fever. A detailed review of systems was otherwise entirely stable.    PAST MEDICAL HISTORY: Past Medical History:  Diagnosis Date  . Allergy   . Depression   . Family history of malignant neoplasm of breast   . Family history of uterine cancer   . Migraine   . UTI (lower urinary tract infection)     PAST SURGICAL HISTORY: Past Surgical History:  Procedure Laterality Date  . BREAST BIOPSY  2011   benign  . BREAST EXCISIONAL BIOPSY Left   . TONSILLECTOMY AND ADENOIDECTOMY  1993    FAMILY HISTORY Family History  Problem Relation Age of Onset  . Cancer Mother        uterine @ 22, breast @ 25, and  melanoma @ 74  . Hypertension Mother   . Asthma Mother   . Arrhythmia Mother   . Hyperlipidemia Father   . Hypertension Father   . Diabetes Father   . Heart block Father        had angioplasty  . Cancer Maternal Grandfather        lung; smoker; deceased 10  . Cancer Paternal Grandmother 17       colon cancer; deceased 70  . Alcohol abuse Paternal Grandfather   . Cancer Maternal Aunt        skin & colon polyps (#/type?)  . Cancer Maternal Uncle        skin  . Cancer  Paternal Aunt 42       breast; currently 59  . Migraines Other        paternal side  . Breast cancer Neg Hx    the patient's mother was diagnosed with cancer of the uterus at age 43, cancer of the breast at age 84, and melanoma at age 23. This may well represent a p53 mutation, but there are many other possibilities. Unfortunately the patient's mother, who is still living, refuses to be genetically tested. The patient's maternal grandfather had a history of brain and lung cancer (these apparently were 2 separate primaries). He was a smoker. He died at age 3. 4 of the patient's mother's siblings had skin cancers, but not melanomas. On the paternal side the paternal grandmother was diagnosed with colon cancer at age 15 and a paternal aunt was diagnosed with breast cancer at age 81. The patient's father has a history of precancerous polyps.  GYNECOLOGIC HISTORY:  No LMP recorded. Menarche age 6. The patient is GX P0. She understands that waiting after age 71 to carry a child essentially doubles the risk of breast cancer.  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 She also has a second job at [employer]. She is single, and lives alone, with no pets    ADVANCED DIRECTIVES: Not in place. At her 04/26/2014 visit she was given the appropriate documents to complete and notarize at her discretion.   HEALTH MAINTENANCE: Social History   Tobacco Use  . Smoking status: Never Smoker  . Smokeless tobacco: Never Used  Substance Use Topics  . Alcohol use: Yes    Alcohol/week: 3.0 standard drinks    Types: 3 Standard drinks or equivalent per week  . Drug use: No     Colonoscopy: Never  PAP: March 2015  Bone density: Never  Lipid panel:  Allergies  Allergen Reactions  . Codone [Hydrocodone]   . Penicillins   . Sulfur     Current Outpatient Medications  Medication Sig Dispense Refill  .  albuterol (PROAIR HFA) 108 (90 Base) MCG/ACT inhaler Inhale 2 puffs into the lungs every 4 (four) hours as needed for wheezing or shortness of breath (cough). 1 Inhaler 1  . cetirizine (ZYRTEC) 10 MG tablet Take 10 mg by mouth daily.    Marland Kitchen doxycycline (VIBRA-TABS) 100 MG tablet Take 1 tablet (100 mg total) by mouth 2 (two) times daily. 20 tablet 0  . escitalopram (LEXAPRO) 20 MG tablet Take 1 tablet (20 mg total) by mouth daily. 30 tablet 11  . fluticasone (FLONASE) 50 MCG/ACT nasal spray SHAKE LIQUID AND USE 2 SPRAYS IN EACH NOSTRIL DAILY 16 g 2  . norethindrone-ethinyl estradiol (JUNEL FE 1/20) 1-20 MG-MCG tablet Take 1 tablet  by mouth daily. 84 tablet 4  . OnabotulinumtoxinA (BOTOX IJ) Inject as directed. Reported on 02/23/2015    . SUMAtriptan (IMITREX) 100 MG tablet Take 100 mg by mouth every 2 (two) hours as needed for migraine or headache. Reported on 02/23/2015     No current facility-administered medications for this visit.     OBJECTIVE: Young white woman in no acute distress  Vitals:   01/06/18 1521  BP: 128/80  Pulse: 66  Resp: 20  Temp: 98.8 F (37.1 C)  SpO2: 100%     Body mass index is 24.63 kg/m.    ECOG FS:0 - Asymptomatic  Sclerae unicteric, EOMs intact Oropharynx clear and moist No cervical or supraclavicular adenopathy Lungs no rales or rhonchi Heart regular rate and rhythm Abd soft, nontender, positive bowel sounds MSK no focal spinal tenderness, no upper extremity lymphedema Neuro: nonfocal, well oriented, appropriate affect Breasts: Both breasts are appropriately dense given the patient's age.  There are not unusually lumpy.  There are no findings suggestive of cancer.  There are no skin or nipple changes of concern.  Both axillae are benign  LAB RESULTS:  CMP     Component Value Date/Time   NA 139 01/06/2018 1457   NA 140 01/02/2017 1137   K 3.7 01/06/2018 1457   K 4.0 01/02/2017 1137   CL 105 01/06/2018 1457   CO2 27 01/06/2018 1457   CO2 26  01/02/2017 1137   GLUCOSE 89 01/06/2018 1457   GLUCOSE 94 01/02/2017 1137   BUN 12 01/06/2018 1457   BUN 10.8 01/02/2017 1137   CREATININE 0.77 01/06/2018 1457   CREATININE 0.8 01/02/2017 1137   CALCIUM 8.8 (L) 01/06/2018 1457   CALCIUM 9.2 01/02/2017 1137   PROT 6.6 01/06/2018 1457   PROT 6.6 01/02/2017 1137   ALBUMIN 3.9 01/06/2018 1457   ALBUMIN 3.9 01/02/2017 1137   AST 17 01/06/2018 1457   AST 16 01/02/2017 1137   ALT 13 01/06/2018 1457   ALT 10 01/02/2017 1137   ALKPHOS 40 01/06/2018 1457   ALKPHOS 50 01/02/2017 1137   BILITOT 0.2 (L) 01/06/2018 1457   BILITOT 0.40 01/02/2017 1137   GFRNONAA >60 01/06/2018 1457   GFRAA >60 01/06/2018 1457    INo results found for: SPEP, UPEP  Lab Results  Component Value Date   WBC 7.3 01/06/2018   NEUTROABS 4.5 01/06/2018   HGB 13.2 01/06/2018   HCT 40.2 01/06/2018   MCV 90.7 01/06/2018   PLT 192 01/06/2018      Chemistry      Component Value Date/Time   NA 139 01/06/2018 1457   NA 140 01/02/2017 1137   K 3.7 01/06/2018 1457   K 4.0 01/02/2017 1137   CL 105 01/06/2018 1457   CO2 27 01/06/2018 1457   CO2 26 01/02/2017 1137   BUN 12 01/06/2018 1457   BUN 10.8 01/02/2017 1137   CREATININE 0.77 01/06/2018 1457   CREATININE 0.8 01/02/2017 1137      Component Value Date/Time   CALCIUM 8.8 (L) 01/06/2018 1457   CALCIUM 9.2 01/02/2017 1137   ALKPHOS 40 01/06/2018 1457   ALKPHOS 50 01/02/2017 1137   AST 17 01/06/2018 1457   AST 16 01/02/2017 1137   ALT 13 01/06/2018 1457   ALT 10 01/02/2017 1137   BILITOT 0.2 (L) 01/06/2018 1457   BILITOT 0.40 01/02/2017 1137       No results found for: LABCA2  No components found for: XMIWO032  No results for input(s): INR  in the last 168 hours.  Urinalysis    Component Value Date/Time   BILIRUBINUR negative 06/14/2014 1630   PROTEINUR negative 06/14/2014 1630   UROBILINOGEN 0.2 06/14/2014 1630   NITRITE negative 06/14/2014 1630   LEUKOCYTESUR large (3+) 06/14/2014 1630      STUDIES: No results found.   ASSESSMENT: 37 y.o. [place] woman with a high-risk family history for breast, uterine, melanoma and colon cancers  (1) genetics testing 05/27/2014 through the BRCA 1 and 2 testing 05/07/2010 negative (BARTs not sent)  (2) genetics testing 05/05/2014 through the [place] gene panel at [facility] showed no deleterious mutations in ATM, BARD1, BRCA1, BRCA2, BRIP1, CDH1, CHEK2, MRE11A, MUTYH, NBN, NF1, PALB2, PTEN, RAD50, RAD51C, RAD51D, and TP53  (3) intensified screening plan: Initiated June 2015  (a) yearly gynecology visits/ Sabra Heck  (b) yearly mammography/eventually alternating with fast MRI  (c) regular dermatology screening/ [place]  (d) annual ophthalmology exam/ [place]: Arriyah is now 4 years into her intensified screening program.  Everything is in place and she is very compliant with visits and screening suggestions.  We are trying to develop a "fast MRI" together with [facility] radiology which will make it possible for patients like her to afford the yearly MRI which she needs.  She will see me again in 1 year.  She knows to call for any other issues that may develop before the next visit here.  Morrison Mcbryar, Virgie Dad, MD  01/06/18 3:51 PM Medical Oncology and Hematology [facility] [address] [place], [place] [address] Tel. [phone]    Fax. [phone]  Dierdre Searles Dweik am acting as scribe for Dr. Virgie Dad Jalene Lacko.  I, Lurline Del MD, have reviewed the above documentation for accuracy and completeness, and I agree with the above.

## 2018-01-06 ENCOUNTER — Telehealth: Payer: Self-pay | Admitting: Oncology

## 2018-01-06 ENCOUNTER — Inpatient Hospital Stay: Payer: BC Managed Care – PPO | Attending: Oncology | Admitting: Oncology

## 2018-01-06 ENCOUNTER — Inpatient Hospital Stay: Payer: BC Managed Care – PPO

## 2018-01-06 VITALS — BP 128/80 | HR 66 | Temp 98.8°F | Resp 20 | Ht 63.75 in | Wt 142.4 lb

## 2018-01-06 DIAGNOSIS — Z1239 Encounter for other screening for malignant neoplasm of breast: Secondary | ICD-10-CM

## 2018-01-06 DIAGNOSIS — Z809 Family history of malignant neoplasm, unspecified: Secondary | ICD-10-CM | POA: Insufficient documentation

## 2018-01-06 DIAGNOSIS — Z808 Family history of malignant neoplasm of other organs or systems: Secondary | ICD-10-CM | POA: Diagnosis not present

## 2018-01-06 DIAGNOSIS — Z803 Family history of malignant neoplasm of breast: Secondary | ICD-10-CM | POA: Diagnosis present

## 2018-01-06 DIAGNOSIS — Z800 Family history of malignant neoplasm of digestive organs: Secondary | ICD-10-CM | POA: Diagnosis not present

## 2018-01-06 DIAGNOSIS — Z8049 Family history of malignant neoplasm of other genital organs: Secondary | ICD-10-CM | POA: Insufficient documentation

## 2018-01-06 LAB — CBC WITH DIFFERENTIAL (CANCER CENTER ONLY)
Abs Immature Granulocytes: 0.02 10*3/uL (ref 0.00–0.07)
Basophils Absolute: 0 10*3/uL (ref 0.0–0.1)
Basophils Relative: 0 %
Eosinophils Absolute: 0.1 10*3/uL (ref 0.0–0.5)
Eosinophils Relative: 1 %
HCT: 40.2 % (ref 36.0–46.0)
Hemoglobin: 13.2 g/dL (ref 12.0–15.0)
Immature Granulocytes: 0 %
Lymphocytes Relative: 30 %
Lymphs Abs: 2.2 10*3/uL (ref 0.7–4.0)
MCH: 29.8 pg (ref 26.0–34.0)
MCHC: 32.8 g/dL (ref 30.0–36.0)
MCV: 90.7 fL (ref 80.0–100.0)
Monocytes Absolute: 0.5 10*3/uL (ref 0.1–1.0)
Monocytes Relative: 6 %
Neutro Abs: 4.5 10*3/uL (ref 1.7–7.7)
Neutrophils Relative %: 63 %
Platelet Count: 192 10*3/uL (ref 150–400)
RBC: 4.43 MIL/uL (ref 3.87–5.11)
RDW: 12.3 % (ref 11.5–15.5)
WBC Count: 7.3 10*3/uL (ref 4.0–10.5)
nRBC: 0 % (ref 0.0–0.2)

## 2018-01-06 LAB — CMP (CANCER CENTER ONLY)
ALT: 13 U/L (ref 0–44)
AST: 17 U/L (ref 15–41)
Albumin: 3.9 g/dL (ref 3.5–5.0)
Alkaline Phosphatase: 40 U/L (ref 38–126)
Anion gap: 7 (ref 5–15)
BUN: 12 mg/dL (ref 6–20)
CO2: 27 mmol/L (ref 22–32)
Calcium: 8.8 mg/dL — ABNORMAL LOW (ref 8.9–10.3)
Chloride: 105 mmol/L (ref 98–111)
Creatinine: 0.77 mg/dL (ref 0.44–1.00)
GFR, Est AFR Am: >60 mL/min (ref >60)
GFR, Estimated: >60 mL/min (ref >60)
Glucose, Bld: 89 mg/dL (ref 70–99)
Potassium: 3.7 mmol/L (ref 3.5–5.1)
Sodium: 139 mmol/L (ref 135–145)
Total Bilirubin: 0.2 mg/dL — ABNORMAL LOW (ref 0.3–1.2)
Total Protein: 6.6 g/dL (ref 6.5–8.1)

## 2018-01-06 NOTE — Telephone Encounter (Signed)
Printed calendar and avs. °

## 2018-03-04 HISTORY — PX: BREAST BIOPSY: SHX20

## 2018-06-01 ENCOUNTER — Other Ambulatory Visit: Payer: Self-pay | Admitting: Family Medicine

## 2018-06-01 NOTE — Telephone Encounter (Signed)
Please schedule a web ex appt and refill until then

## 2018-06-01 NOTE — Telephone Encounter (Signed)
Last OV 06/06/17 and no future appts., ? If you want pt to do webex med refill f/u appt?, please advise

## 2018-06-01 NOTE — Telephone Encounter (Signed)
I left a detailed message on patient's voice mail to call back and schedule a Webex appointment with Dr. Glori Bickers.

## 2018-06-02 ENCOUNTER — Other Ambulatory Visit: Payer: Self-pay

## 2018-06-02 ENCOUNTER — Encounter: Payer: Self-pay | Admitting: Family Medicine

## 2018-06-02 ENCOUNTER — Ambulatory Visit (INDEPENDENT_AMBULATORY_CARE_PROVIDER_SITE_OTHER): Payer: BC Managed Care – PPO | Admitting: Family Medicine

## 2018-06-02 DIAGNOSIS — J301 Allergic rhinitis due to pollen: Secondary | ICD-10-CM

## 2018-06-02 DIAGNOSIS — F430 Acute stress reaction: Secondary | ICD-10-CM

## 2018-06-02 DIAGNOSIS — F411 Generalized anxiety disorder: Secondary | ICD-10-CM

## 2018-06-02 MED ORDER — BUSPIRONE HCL 15 MG PO TABS: 7.5000 mg | ORAL_TABLET | Freq: Two times a day (BID) | ORAL | 11 refills | Status: DC

## 2018-06-02 MED ORDER — ESCITALOPRAM OXALATE 20 MG PO TABS: 20.0000 mg | ORAL_TABLET | Freq: Every day | ORAL | 11 refills | Status: DC

## 2018-06-02 NOTE — Assessment & Plan Note (Signed)
This improved in the past greatly with lexapro 20 mg  Now new stressors include the covid pandemic and figuring out how to work from home Reviewed stressors/ coping techniques/symptoms/ support sources/ tx options and side effects in detail today Does not feel depressed or tearful  Does have early am awakening  Disc options for this Will add buspar 7.5 mg bid Discussed expectations of this medication including time to effectiveness and mechanism of action, also poss of side effects (early and late)- including mental fuzziness, weight or appetite change, nausea and poss of worse dep or anxiety (even suicidal thoughts)  Pt voiced understanding and will stop med and update if this occurs   Self care reviewed -enc strongly to keep meditating and also running/walking when able  Ref for counseling done as well for CBT which I think will be helpful  inst to alert [place] if problems or side effects including suicidal thoughts  Enc her to update in several weeks to let us know how she is doing (we can titrate the buspar if needed) She will also continue lexapro

## 2018-06-02 NOTE — Progress Notes (Signed)
Virtual Visit via Video Note  I connected with Kirsten Wilson on 06/02/18 at  3:45 PM EDT by a video enabled telemedicine application and verified that I am speaking with the correct person using two identifiers. The patient is at home today and I am in my office for the visit    I discussed the limitations of evaluation and management by telemedicine and the availability of in person appointments. The patient expressed understanding and agreed to proceed.  History of Present Illness: Here for f/u of chronic health problems   Last visit about a year ago we discussed anxiety and stressors as well as poor sleep I px lexapro 10 mg to titrate to 20 mg daily  Disc plan for self care/exercise/time for sleep as well as value of mindfulness and meditation (she was not interested in counseling)  Was doing well  Anxiety kicked up lately - feels chest tightness like she did before  Does a meditation at night and it is helpful  Walking and running help also   She is having early am awakening again  Melatonin does not help  Antihistamines do not help   Father had a rough heart surgery-doing better -lives on [place] about them  Work got really stressful in the winter  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 Negative for chills, diaphoresis, fever and weight loss.  HENT: Positive for congestion. Negative for sinus pain.        Rhinorrhea and sneezing  Eyes: Negative for discharge and redness.  Respiratory: Negative for cough, shortness of breath and wheezing.   Cardiovascular: Negative for chest pain, palpitations and leg swelling.  Gastrointestinal: Negative for constipation and diarrhea.  Musculoskeletal: Negative  for myalgias.  Skin: Negative for itching and rash.  Neurological: Positive for headaches. Negative for dizziness and focal weakness.  Psychiatric/Behavioral: Negative for depression, memory loss, substance abuse and suicidal ideas. The patient is nervous/anxious and has insomnia.      Patient Active Problem List   Diagnosis Date Noted  . Anxiety as acute reaction to gross stress 06/06/2017  . 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 Cervical polyp 08/26/2014  . Urinary frequency 06/14/2014  . Genetic testing 05/27/2014  . Family history of uterine cancer   . Breast cancer screening, high risk patient 03/30/2014  . Allergic rhinitis 03/30/2014  . Migraine 03/30/2014  . History of depression 03/30/2014  . Family history of breast cancer 03/30/2014   Past Medical History:  Diagnosis Date  . Allergy   . Depression   . Family history of malignant neoplasm of breast   . Family history of uterine cancer   . Migraine   . UTI (lower urinary tract infection)    Past Surgical History:  Procedure Laterality Date  . BREAST BIOPSY  2011   benign  . BREAST EXCISIONAL BIOPSY Left   . TONSILLECTOMY AND ADENOIDECTOMY  1993   Social History   Tobacco Use  . Smoking status: Never Smoker  . Smokeless tobacco: Never Used  Substance Use Topics  . Alcohol use: Yes    Alcohol/week: 3.0 standard drinks    Types: 3 Standard drinks or equivalent per week  . Drug use: No   Family History  Problem  Relation Age of Onset  . Cancer Mother        uterine @ 45, breast @ 28, and melanoma @ 73  . Hypertension Mother   . Asthma Mother   . Arrhythmia Mother   . Hyperlipidemia Father   . Hypertension Father   . Diabetes Father   . Heart block Father        had angioplasty  . Cancer Maternal Grandfather        lung; smoker; deceased 71  . Cancer Paternal Grandmother 63       colon cancer; deceased 81  . Alcohol abuse Paternal Grandfather   . Cancer Maternal Aunt        skin & colon polyps (#/type?)  .  Cancer Maternal Uncle        skin  . Cancer Paternal Aunt 48       breast; currently 3  . Migraines Other        paternal side  . Breast cancer Neg Hx    Allergies  Allergen Reactions  . Codone [Hydrocodone]   . Penicillins   . Sulfur    Current Outpatient Medications on File Prior to Visit  Medication Sig Dispense Refill  . albuterol (PROAIR HFA) 108 (90 Base) MCG/ACT inhaler Inhale 2 puffs into the lungs every 4 (four) hours as needed for wheezing or shortness of breath (cough). 1 Inhaler 1  . cetirizine (ZYRTEC) 10 MG tablet Take 10 mg by mouth daily.    Marland Kitchen doxycycline (VIBRA-TABS) 100 MG tablet Take 1 tablet (100 mg total) by mouth 2 (two) times daily. 20 tablet 0  . fluticasone (FLONASE) 50 MCG/ACT nasal spray SHAKE LIQUID AND USE 2 SPRAYS IN EACH NOSTRIL DAILY 16 g 2  . norethindrone-ethinyl estradiol (JUNEL FE 1/20) 1-20 MG-MCG tablet Take 1 tablet by mouth daily. 84 tablet 4  . OnabotulinumtoxinA (BOTOX IJ) Inject as directed. Reported on 02/23/2015    . SUMAtriptan (IMITREX) 100 MG tablet Take 100 mg by mouth every 2 (two) hours as needed for migraine or headache. Reported on 02/23/2015     No current facility-administered medications on file prior to visit.       Observations/Objective: Pt appears well and well groomed  Seems anxious but w/o pressured speech  Answers questions appropriately and does not voice SI Pleasant    Assessment and Plan: Problem List Items Addressed This Visit      Respiratory   Allergic rhinitis    Enc pt to continue zyrtec and flonase nasal spray through the allergy season  Also pollen avoidance when possible         Other   Anxiety as acute reaction to gross stress - Primary    This improved in the past greatly with lexapro 20 mg  Now new stressors include the covid pandemic and figuring out how to work from home Reviewed stressors/ coping techniques/symptoms/ support sources/ tx options and side effects in detail today Does not  feel depressed or tearful  Does have early am awakening  Disc options for this Will add buspar 7.5 mg bid Discussed expectations of this medication including time to effectiveness and mechanism of action, also poss of side effects (early and late)- including mental fuzziness, weight or appetite change, nausea and poss of worse dep or anxiety (even suicidal thoughts)  Pt voiced understanding and will stop med and update if this occurs   Self care reviewed -enc strongly to keep meditating and also running/walking when able  Ref for counseling  done as well for CBT which I think will be helpful  inst to alert [place] if problems or side effects including suicidal thoughts  Enc her to update in several weeks to let us know how she is doing (we can titrate the buspar if needed) She will also continue lexapro       Relevant Medications   busPIRone (BUSPAR) 15 MG tablet   escitalopram (LEXAPRO) 20 MG tablet   Other Relevant Orders   Ambulatory referral to Psychology       Follow Up Instructions: Continue lexapro Add buspar 7.5 mg bid -alert [place] if side effects or problems  I placed a referral for counseling- the office will call you about that Let us know how it goes  Continue zyrtec and flonase for allergies    I discussed the assessment and treatment plan with the patient. The patient was provided an opportunity to ask questions and all were answered. The patient agreed with the plan and demonstrated an understanding of the instructions.   The patient was advised to call back or seek an in-person evaluation if the symptoms worsen or if the condition fails to improve as anticipated.    Loura Pardon, MD

## 2018-06-02 NOTE — Assessment & Plan Note (Signed)
Enc pt to continue zyrtec and flonase nasal spray through the allergy season  Also pollen avoidance when possible

## 2018-06-02 NOTE — Telephone Encounter (Signed)
Pt scheduled a webex appt for this afternoon, will hold until she sees Dr. Glori Bickers

## 2018-06-24 ENCOUNTER — Ambulatory Visit (INDEPENDENT_AMBULATORY_CARE_PROVIDER_SITE_OTHER): Payer: BC Managed Care – PPO | Admitting: Psychology

## 2018-06-24 ENCOUNTER — Ambulatory Visit: Payer: Self-pay | Admitting: Psychology

## 2018-06-24 DIAGNOSIS — F419 Anxiety disorder, unspecified: Secondary | ICD-10-CM | POA: Diagnosis not present

## 2018-07-07 ENCOUNTER — Ambulatory Visit (INDEPENDENT_AMBULATORY_CARE_PROVIDER_SITE_OTHER): Payer: BC Managed Care – PPO | Admitting: Psychology

## 2018-07-07 DIAGNOSIS — F419 Anxiety disorder, unspecified: Secondary | ICD-10-CM | POA: Diagnosis not present

## 2018-07-08 ENCOUNTER — Telehealth: Payer: Self-pay | Admitting: Family Medicine

## 2018-07-08 NOTE — Telephone Encounter (Signed)
Left message asking pt to call office   Appointment Request From: Kirsten Wilson    With Provider: Loura Pardon, [facility]    Preferred Date Range: Any    Preferred Times: Any time    Reason for visit: Office Visit    Comments:  I hit my little toe and there is a lot of pain especially when I put pressure on my foot

## 2018-07-08 NOTE — Telephone Encounter (Signed)
Pt called back she stated she went to urgent care

## 2018-07-16 ENCOUNTER — Ambulatory Visit (INDEPENDENT_AMBULATORY_CARE_PROVIDER_SITE_OTHER): Payer: BC Managed Care – PPO | Admitting: Psychology

## 2018-07-16 DIAGNOSIS — F419 Anxiety disorder, unspecified: Secondary | ICD-10-CM | POA: Diagnosis not present

## 2018-07-24 ENCOUNTER — Ambulatory Visit (INDEPENDENT_AMBULATORY_CARE_PROVIDER_SITE_OTHER): Payer: BC Managed Care – PPO | Admitting: Psychology

## 2018-07-24 DIAGNOSIS — F419 Anxiety disorder, unspecified: Secondary | ICD-10-CM | POA: Diagnosis not present

## 2018-08-07 ENCOUNTER — Ambulatory Visit (INDEPENDENT_AMBULATORY_CARE_PROVIDER_SITE_OTHER): Payer: BC Managed Care – PPO | Admitting: Psychology

## 2018-08-07 DIAGNOSIS — F419 Anxiety disorder, unspecified: Secondary | ICD-10-CM

## 2018-08-21 ENCOUNTER — Ambulatory Visit: Payer: BC Managed Care – PPO | Admitting: Psychology

## 2018-08-27 ENCOUNTER — Other Ambulatory Visit: Payer: Self-pay

## 2018-08-28 ENCOUNTER — Ambulatory Visit (INDEPENDENT_AMBULATORY_CARE_PROVIDER_SITE_OTHER): Payer: BC Managed Care – PPO | Admitting: Psychology

## 2018-08-28 DIAGNOSIS — F419 Anxiety disorder, unspecified: Secondary | ICD-10-CM | POA: Diagnosis not present

## 2018-08-31 ENCOUNTER — Other Ambulatory Visit: Payer: Self-pay

## 2018-08-31 ENCOUNTER — Encounter: Payer: Self-pay | Admitting: Obstetrics & Gynecology

## 2018-08-31 ENCOUNTER — Ambulatory Visit: Payer: BC Managed Care – PPO | Admitting: Obstetrics & Gynecology

## 2018-08-31 ENCOUNTER — Other Ambulatory Visit (HOSPITAL_COMMUNITY)
Admission: RE | Admit: 2018-08-31 | Discharge: 2018-08-31 | Disposition: A | Discharge status: Home / Self Care | Payer: BC Managed Care – PPO | Source: Ambulatory Visit | Attending: Obstetrics & Gynecology | Admitting: Obstetrics & Gynecology

## 2018-08-31 VITALS — BP 112/76 | HR 80 | Temp 98.1°F | Ht 63.5 in | Wt 150.0 lb

## 2018-08-31 DIAGNOSIS — Z124 Encounter for screening for malignant neoplasm of cervix: Secondary | ICD-10-CM | POA: Insufficient documentation

## 2018-08-31 DIAGNOSIS — Z01419 Encounter for gynecological examination (general) (routine) without abnormal findings: Secondary | ICD-10-CM | POA: Diagnosis not present

## 2018-08-31 DIAGNOSIS — Z0000 Encounter for general adult medical examination without abnormal findings: Secondary | ICD-10-CM | POA: Diagnosis not present

## 2018-08-31 DIAGNOSIS — Z8049 Family history of malignant neoplasm of other genital organs: Secondary | ICD-10-CM

## 2018-08-31 MED ORDER — NORETHIN ACE-ETH ESTRAD-FE 1-20 MG-MCG PO TABS: 1.0000 | ORAL_TABLET | Freq: Every day | ORAL | 4 refills | Status: DC

## 2018-08-31 NOTE — Progress Notes (Signed)
38 y.o. G0P0000 Single White or Caucasian female here for annual exam.  Doing well.  Cycles are regular.  Limited breast MRI discussed with pt today.  Tyrer Cusick model calculated today.  Was almost 30%.  Currently, she is having a mammogram every six months.  Last PUS was September 18, 2017, and was normal.    Dr. Glori Bickers added Buspar and is now doing therapy.    Patient's last menstrual period was 08/19/2018.          Sexually active: No The current method of family planning is OCP (estrogen/progesterone).    Exercising: Yes.    run 2-3 x weekly  Smoker:  no  Health Maintenance: Pap:  08/21/17 Neg   08/19/16 Neg  History of abnormal Pap:  no MMG:  01/05/18 BIRADS1:neg.  Six month follow up would be do at this time. TDaP:  2016 Screening Labs: PCP   reports that she has never smoked. She has never used smokeless tobacco. She reports current alcohol use of about 4.0 standard drinks of alcohol per week. She reports that she does not use drugs.  Past Medical History:  Diagnosis Date  . Allergy   . Depression   . Family history of malignant neoplasm of breast   . Family history of uterine cancer   . Migraine   . UTI (lower urinary tract infection)     Past Surgical History:  Procedure Laterality Date  . BREAST BIOPSY  2011   benign  . BREAST EXCISIONAL BIOPSY Left   . TONSILLECTOMY AND ADENOIDECTOMY  1993    Current Outpatient Medications  Medication Sig Dispense Refill  . albuterol (PROAIR HFA) 108 (90 Base) MCG/ACT inhaler Inhale 2 puffs into the lungs every 4 (four) hours as needed for wheezing or shortness of breath (cough). 1 Inhaler 1  . busPIRone (BUSPAR) 15 MG tablet Take 0.5 tablets (7.5 mg total) by mouth 2 (two) times daily. 30 tablet 11  . cetirizine (ZYRTEC) 10 MG tablet Take 10 mg by mouth daily.    Marland Kitchen escitalopram (LEXAPRO) 20 MG tablet Take 1 tablet (20 mg total) by mouth daily. 30 tablet 11  . fluticasone (FLONASE) 50 MCG/ACT nasal spray SHAKE LIQUID AND USE 2  SPRAYS IN EACH NOSTRIL DAILY 16 g 2  . norethindrone-ethinyl estradiol (JUNEL FE 1/20) 1-20 MG-MCG tablet Take 1 tablet by mouth daily. 84 tablet 4  . OnabotulinumtoxinA (BOTOX IJ) Inject as directed. Reported on 02/23/2015    . SUMAtriptan (IMITREX) 100 MG tablet Take 100 mg by mouth every 2 (two) hours as needed for migraine or headache. Reported on 02/23/2015     No current facility-administered medications for this visit.     Family History  Problem Relation Age of Onset  . Cancer Mother        uterine @ 40, breast @ 36, and melanoma @ 59  . Hypertension Mother   . Asthma Mother   . Arrhythmia Mother   . Hyperlipidemia Father   . Hypertension Father   . Diabetes Father   . Heart block Father        had angioplasty  . Cancer Maternal Grandfather        lung; smoker; deceased 77  . Cancer Paternal Grandmother 29       colon cancer; deceased 42  . Alcohol abuse Paternal Grandfather   . Cancer Maternal Aunt        skin & colon polyps (#/type?)  . Cancer Maternal Uncle  skin  . Cancer Paternal Aunt 31       breast; currently 31  . Migraines Other        paternal side  . Breast cancer Neg Hx     Review of Systems  All other systems reviewed and are negative.   Exam:   BP 112/76   Pulse 80   Temp 98.1 F (36.7 C) (Temporal)   Ht 5' 3.5" (1.613 m)   Wt 150 lb (68 kg)   LMP 08/19/2018   BMI 26.15 kg/m    Height: 5' 3.5" (161.3 cm)  Ht Readings from Last 3 Encounters:  08/31/18 5' 3.5" (1.613 m)  01/06/18 5' 3.75" (1.619 m)  09/18/17 5' 3.75" (1.619 m)    General appearance: alert, cooperative and appears stated age Head: Normocephalic, without obvious abnormality, atraumatic Neck: no adenopathy, supple, symmetrical, trachea midline and thyroid normal to inspection and palpation Lungs: clear to auscultation bilaterally Breasts: normal appearance, no masses or tenderness Heart: regular rate and rhythm Abdomen: soft, non-tender; bowel sounds normal; no  masses,  no organomegaly Extremities: extremities normal, atraumatic, no cyanosis or edema Skin: Skin color, texture, turgor normal. No rashes or lesions Lymph nodes: Cervical, supraclavicular, and axillary nodes normal. No abnormal inguinal nodes palpated Neurologic: Grossly normal   Pelvic: External genitalia:  no lesions              Urethra:  normal appearing urethra with no masses, tenderness or lesions              Bartholins and Skenes: normal                 Vagina: normal appearing vagina with normal color and discharge, no lesions              Cervix: no lesions              Pap taken: Yes.   Bimanual Exam:  Uterus:  normal size, contour, position, consistency, mobility, non-tender              Adnexa: normal adnexa and no mass, fullness, tenderness               Rectovaginal: Confirms               Anus:  normal sphincter tone, no lesions  Chaperone was present for exam.  A:  Well Woman with normal exam Strong family history of breast, uterine (mother diagnosed at age 15), and melanoma Paternal family hx of colon cancer--paternal GM  P:   Mammogram every six months.  Yearly MRI has been recommended but she has not been able to afford this pap smear and HR HPV testing obtained today RF for Microgestin 1/20 Fe daily.  #3 month supply/4RF CBC, CMP, TSH, Vit D , lipids (non fasting) obtained today She will return for PUS after 09/19/2018. Return annually or prn

## 2018-09-01 ENCOUNTER — Telehealth: Payer: Self-pay | Admitting: *Deleted

## 2018-09-01 ENCOUNTER — Encounter: Payer: Self-pay | Admitting: Obstetrics & Gynecology

## 2018-09-01 ENCOUNTER — Ambulatory Visit: Payer: BC Managed Care – PPO | Admitting: Psychology

## 2018-09-01 DIAGNOSIS — Z803 Family history of malignant neoplasm of breast: Secondary | ICD-10-CM

## 2018-09-01 DIAGNOSIS — Z1231 Encounter for screening mammogram for malignant neoplasm of breast: Secondary | ICD-10-CM

## 2018-09-01 DIAGNOSIS — R922 Inconclusive mammogram: Secondary | ICD-10-CM

## 2018-09-01 DIAGNOSIS — Z1239 Encounter for other screening for malignant neoplasm of breast: Secondary | ICD-10-CM

## 2018-09-01 DIAGNOSIS — Z8049 Family history of malignant neoplasm of other genital organs: Secondary | ICD-10-CM

## 2018-09-01 DIAGNOSIS — Z9189 Other specified personal risk factors, not elsewhere classified: Secondary | ICD-10-CM

## 2018-09-01 LAB — COMPREHENSIVE METABOLIC PANEL
ALT: 15 IU/L (ref 0–32)
AST: 19 IU/L (ref 0–40)
Albumin/Globulin Ratio: 2.3 — ABNORMAL HIGH (ref 1.2–2.2)
Albumin: 4.3 g/dL (ref 3.8–4.8)
Alkaline Phosphatase: 43 IU/L (ref 39–117)
BUN/Creatinine Ratio: 11 (ref 9–23)
BUN: 8 mg/dL (ref 6–20)
Bilirubin Total: 0.3 mg/dL (ref 0.0–1.2)
CO2: 24 mmol/L (ref 20–29)
Calcium: 8.9 mg/dL (ref 8.7–10.2)
Chloride: 105 mmol/L (ref 96–106)
Creatinine, Ser: 0.72 mg/dL (ref 0.57–1.00)
GFR calc Af Amer: 123 mL/min/{1.73_m2} (ref >59)
GFR calc non Af Amer: 107 mL/min/{1.73_m2} (ref >59)
Globulin, Total: 1.9 g/dL (ref 1.5–4.5)
Glucose: 71 mg/dL (ref 65–99)
Potassium: 4.2 mmol/L (ref 3.5–5.2)
Sodium: 144 mmol/L (ref 134–144)
Total Protein: 6.2 g/dL (ref 6.0–8.5)

## 2018-09-01 LAB — CBC
Hematocrit: 40.8 % (ref 34.0–46.6)
Hemoglobin: 13.5 g/dL (ref 11.1–15.9)
MCH: 29.5 pg (ref 26.6–33.0)
MCHC: 33.1 g/dL (ref 31.5–35.7)
MCV: 89 fL (ref 79–97)
Platelets: 215 10*3/uL (ref 150–450)
RBC: 4.57 x10E6/uL (ref 3.77–5.28)
RDW: 11.9 % (ref 11.7–15.4)
WBC: 8.7 10*3/uL (ref 3.4–10.8)

## 2018-09-01 LAB — LIPID PANEL
Chol/HDL Ratio: 2.7 ratio (ref 0.0–4.4)
Cholesterol, Total: 138 mg/dL (ref 100–199)
HDL: 51 mg/dL (ref >39)
LDL Calculated: 63 mg/dL (ref 0–99)
Triglycerides: 119 mg/dL (ref 0–149)
VLDL Cholesterol Cal: 24 mg/dL (ref 5–40)

## 2018-09-01 LAB — TSH: TSH: 1.24 u[IU]/mL (ref 0.450–4.500)

## 2018-09-01 LAB — VITAMIN D 25 HYDROXY (VIT D DEFICIENCY, FRACTURES): Vit D, 25-Hydroxy: 34 ng/mL (ref 30.0–100.0)

## 2018-09-01 NOTE — Telephone Encounter (Signed)
-----  Message from Megan Salon, MD sent at 09/01/2018  1:50 PM EDT ----- Regarding: breast MRI Sharee Pimple, Can you call this pt and let her know that Dr. Jana Hakim agreed the limited breast MRI is not appropriate for her due to her lifetime risk of >20%.  She thought she'd met her deductible this year so is willing to proceed with full breast MRI.  Can you please

## 2018-09-01 NOTE — Telephone Encounter (Signed)
-----   Message from Megan Salon, MD sent at 09/01/2018  2:46 PM EDT ----- Regarding: breast MRI Sharee Pimple, This pt needs a breast MRI due ot lifetime risk of breast cancer of >30%.  Can you proceed with scheduling and precert?  Thanks.  Vinnie Level

## 2018-09-01 NOTE — Telephone Encounter (Signed)
Order placed for bilateral breast MRI w/wo contrast at [facility].   Spoke with patient, advised per Dr. Sabra Heck. Patient agreeable to proceed with breast MRI. Advised patient [facility] will contact her directly to schedule breast MRI, our office will precert once scheduled. Patient verbalizes understanding and is agreeable.   Routing to provider for final review. Patient is agreeable to disposition. Will close encounter.  Cc: Lerry Liner

## 2018-09-02 LAB — CYTOLOGY - PAP
Diagnosis: NEGATIVE
HPV: NOT DETECTED

## 2018-09-07 ENCOUNTER — Ambulatory Visit: Payer: BC Managed Care – PPO | Admitting: Psychology

## 2018-09-08 ENCOUNTER — Ambulatory Visit (INDEPENDENT_AMBULATORY_CARE_PROVIDER_SITE_OTHER): Payer: BC Managed Care – PPO | Admitting: Psychology

## 2018-09-08 DIAGNOSIS — F419 Anxiety disorder, unspecified: Secondary | ICD-10-CM

## 2018-09-22 ENCOUNTER — Ambulatory Visit
Admission: RE | Admit: 2018-09-22 | Discharge: 2018-09-22 | Disposition: A | Discharge status: Home / Self Care | Payer: BC Managed Care – PPO | Source: Ambulatory Visit | Attending: Obstetrics & Gynecology | Admitting: Obstetrics & Gynecology

## 2018-09-22 ENCOUNTER — Other Ambulatory Visit: Payer: Self-pay

## 2018-09-22 DIAGNOSIS — Z8049 Family history of malignant neoplasm of other genital organs: Secondary | ICD-10-CM

## 2018-09-22 DIAGNOSIS — Z803 Family history of malignant neoplasm of breast: Secondary | ICD-10-CM

## 2018-09-22 DIAGNOSIS — Z1239 Encounter for other screening for malignant neoplasm of breast: Secondary | ICD-10-CM

## 2018-09-22 DIAGNOSIS — Z9189 Other specified personal risk factors, not elsewhere classified: Secondary | ICD-10-CM

## 2018-09-22 DIAGNOSIS — R922 Inconclusive mammogram: Secondary | ICD-10-CM

## 2018-09-22 MED ORDER — GADOBUTROL 1 MMOL/ML IV SOLN
7.0000 mL | Freq: Once | INTRAVENOUS | Status: AC | PRN
  Administered 2018-09-22: 7 mL via INTRAVENOUS

## 2018-09-24 ENCOUNTER — Ambulatory Visit: Payer: BC Managed Care – PPO | Admitting: Obstetrics & Gynecology

## 2018-09-24 ENCOUNTER — Telehealth: Payer: Self-pay | Admitting: *Deleted

## 2018-09-24 ENCOUNTER — Ambulatory Visit (INDEPENDENT_AMBULATORY_CARE_PROVIDER_SITE_OTHER): Payer: BC Managed Care – PPO

## 2018-09-24 ENCOUNTER — Other Ambulatory Visit: Payer: Self-pay

## 2018-09-24 ENCOUNTER — Encounter: Payer: Self-pay | Admitting: Obstetrics & Gynecology

## 2018-09-24 VITALS — BP 104/76 | HR 64 | Temp 97.9°F | Ht 63.5 in | Wt 148.0 lb

## 2018-09-24 DIAGNOSIS — Z8049 Family history of malignant neoplasm of other genital organs: Secondary | ICD-10-CM | POA: Diagnosis not present

## 2018-09-24 DIAGNOSIS — Z9189 Other specified personal risk factors, not elsewhere classified: Secondary | ICD-10-CM

## 2018-09-24 DIAGNOSIS — N6310 Unspecified lump in the right breast, unspecified quadrant: Secondary | ICD-10-CM

## 2018-09-24 DIAGNOSIS — Z803 Family history of malignant neoplasm of breast: Secondary | ICD-10-CM

## 2018-09-24 NOTE — Progress Notes (Signed)
38 y.o. G0P0000 Single White or Caucasian female here for pelvic ultrasound due to family hx of endometrial cancer in her mother.  She has undergone negative genetic testing.  She had breast MRI 7/21/202 showing 8 x 5 x 4mm lesion.  Biopsy is recommended.  Reviewed with pt today.  Patient's last menstrual period was 08/19/2018.  Contraception: OCPs  Findings:  UTERUS: 7.3 x 4.6 x 3.5cm EMS: 4.47mm ADNEXA: Left ovary: 2.1 x 1.5 x 0.8cm with 8 x 20mm follicle       Right ovary: 2.0 x .7 x 1.0cm CUL DE SAC: no free fluid  Discussion:  Findings reviewed.  Pt reassured.    Assessment:  Family hx of breast and uterine cancer.  On OCPs Abnormal breast MRI 09/22/2018  Plan:  MRI guided breast biopsy will be scheduled for the pt.  She is aware we will call with this appt as she desires first available.  ~15 minutes spent with patient >50% of time was in face to face discussion of above.

## 2018-09-24 NOTE — Telephone Encounter (Signed)
Patient was notified of results by Dr. Sabra Heck while in office.   Orders placed for MR guided right breast biopsy and right breast clip placement.   Spoke with patient, advised orders have been placed at [facility] for MR guided right breast biopsy and right breast clip placement. Advised patient [facility] will be contacting her directly to schedule. Advised patient she may also contact them directly at [phone] to schedule. Patient verbalizes understanding and is agreeable.

## 2018-09-24 NOTE — Telephone Encounter (Signed)
-----   Message from Megan Salon, MD sent at 09/24/2018 12:08 PM EDT ----- Please let pt know there is a 8 x 5 x 65mm new finding in the right breast that was not present in 2016.  Biopsy is recommended via MRI guidance due to how small it is.  Can we proceed with scheduling?  Thanks.

## 2018-09-25 NOTE — Telephone Encounter (Signed)
Per review of Epic, patient is scheduled for breast biopsy on 09/30/18 at [facility].   Routing to provider for final review. Patient is agreeable to disposition. Will close encounter.

## 2018-09-28 ENCOUNTER — Ambulatory Visit (INDEPENDENT_AMBULATORY_CARE_PROVIDER_SITE_OTHER): Payer: BC Managed Care – PPO | Admitting: Psychology

## 2018-09-28 DIAGNOSIS — F419 Anxiety disorder, unspecified: Secondary | ICD-10-CM | POA: Diagnosis not present

## 2018-09-30 ENCOUNTER — Ambulatory Visit
Admission: RE | Admit: 2018-09-30 | Discharge: 2018-09-30 | Disposition: A | Discharge status: Home / Self Care | Payer: BC Managed Care – PPO | Source: Ambulatory Visit | Attending: Obstetrics & Gynecology | Admitting: Obstetrics & Gynecology

## 2018-09-30 ENCOUNTER — Other Ambulatory Visit: Payer: Self-pay

## 2018-09-30 DIAGNOSIS — Z9189 Other specified personal risk factors, not elsewhere classified: Secondary | ICD-10-CM

## 2018-09-30 DIAGNOSIS — Z803 Family history of malignant neoplasm of breast: Secondary | ICD-10-CM

## 2018-09-30 DIAGNOSIS — N6310 Unspecified lump in the right breast, unspecified quadrant: Secondary | ICD-10-CM

## 2018-09-30 MED ORDER — GADOBUTROL 1 MMOL/ML IV SOLN: 7.0000 mL | Freq: Once | INTRAVENOUS | Status: DC | PRN

## 2018-10-12 ENCOUNTER — Ambulatory Visit (INDEPENDENT_AMBULATORY_CARE_PROVIDER_SITE_OTHER): Payer: BC Managed Care – PPO | Admitting: Psychology

## 2018-10-12 DIAGNOSIS — F419 Anxiety disorder, unspecified: Secondary | ICD-10-CM

## 2018-10-29 ENCOUNTER — Ambulatory Visit: Payer: BC Managed Care – PPO | Admitting: Psychology

## 2018-11-04 ENCOUNTER — Ambulatory Visit (INDEPENDENT_AMBULATORY_CARE_PROVIDER_SITE_OTHER): Payer: BC Managed Care – PPO | Admitting: Psychology

## 2018-11-04 DIAGNOSIS — F419 Anxiety disorder, unspecified: Secondary | ICD-10-CM

## 2018-11-06 ENCOUNTER — Other Ambulatory Visit: Payer: Self-pay

## 2018-11-06 ENCOUNTER — Ambulatory Visit: Payer: BC Managed Care – PPO

## 2018-11-11 ENCOUNTER — Ambulatory Visit: Payer: BC Managed Care – PPO | Admitting: Psychology

## 2018-11-18 ENCOUNTER — Ambulatory Visit: Payer: BC Managed Care – PPO | Admitting: Psychology

## 2018-11-25 ENCOUNTER — Ambulatory Visit (INDEPENDENT_AMBULATORY_CARE_PROVIDER_SITE_OTHER): Payer: BC Managed Care – PPO | Admitting: Psychology

## 2018-11-25 DIAGNOSIS — F419 Anxiety disorder, unspecified: Secondary | ICD-10-CM

## 2018-12-09 ENCOUNTER — Ambulatory Visit (INDEPENDENT_AMBULATORY_CARE_PROVIDER_SITE_OTHER): Payer: BC Managed Care – PPO | Admitting: Psychology

## 2018-12-09 DIAGNOSIS — F419 Anxiety disorder, unspecified: Secondary | ICD-10-CM | POA: Diagnosis not present

## 2018-12-23 ENCOUNTER — Ambulatory Visit (INDEPENDENT_AMBULATORY_CARE_PROVIDER_SITE_OTHER): Payer: BC Managed Care – PPO | Admitting: Psychology

## 2018-12-23 DIAGNOSIS — F419 Anxiety disorder, unspecified: Secondary | ICD-10-CM

## 2019-01-06 ENCOUNTER — Ambulatory Visit: Payer: BC Managed Care – PPO | Admitting: Psychology

## 2019-01-07 ENCOUNTER — Ambulatory Visit (INDEPENDENT_AMBULATORY_CARE_PROVIDER_SITE_OTHER): Payer: BC Managed Care – PPO | Admitting: Psychology

## 2019-01-07 DIAGNOSIS — F419 Anxiety disorder, unspecified: Secondary | ICD-10-CM | POA: Diagnosis not present

## 2019-01-09 NOTE — Progress Notes (Signed)
[place]  Telephone:([phone]) [phone] Fax:(336) [phone]     ID: Tyresha Fede DOB: 23-Apr-1980  MR#: [id number]  DGU#:[id number]  Patient Care Team: Abner Greenspan, MD as PCP - General (Family Medicine) Magrinat, Virgie Dad, MD as Consulting Physician (Oncology) Megan Salon, MD as Consulting Physician (Gynecology) Laurence Ferrari, [place], MD as Consulting Physician (Dermatology) OTHER MD:   CHIEF COMPLAINT: high risk family history for breast, uterine and skin cancer  CURRENT TREATMENT: Intensified screening   INTERVAL HISTORY: Bronwyn returns today for follow-up of her intensified screening program given her high risk of breast cancer.  Since her last visit, she underwent bilateral bilateral breast MRI on 09/22/2018 showing: breast composition D; suspicious 8 mm enhancing mass in the upper-outer right breast; no suspicious right axillary lymphadenopathy; no evidence of malignancy on the left.  She proceeded to biopsy of the right breast area in question on 09/30/2018. Pathology from the procedure ([id number]) showed fibrocystic change and adenosis with no evidence of malignancy.  She also underwent pap smear on 08/31/2018, which was negative for malignancy and HPV.   She also underwent repeat vaginal ultrasound that day, which was within normal limits.  Her most recent mammogram, 01/05/2018, also showed a breast density category D.   REVIEW OF SYSTEMS: Arionna exercises by running and usually runs 3 or 4 times a week.  She has now working virtually.  This is difficult and her attendance is about 60% she says.  She has had no symptoms suggestive of Covid 19 no exposure that she is aware of.  Detailed review of systems today was otherwise stable   HISTORY OF PRESENT ILLNESS: From the original intake note:  Blayke moved to this in the area July 2015, from [place]. She had previously lived here. In [place] she was followed by oncology, dermatology, and gynecology for a  family history strongly suggestive of a genetic mutation putting her at increased risk for melanoma, endometrial and breast cancers (her mother had all three cancers at an early age). She did meet with genetic counseling there and on 05/07/2010 was tested for the BRCA1 and BRCA2 genes, with no mutation detected. A broader panel or BART testing were not performed.  She now presents to our high Risk clinic for futher evaluation and follow-up   PAST MEDICAL HISTORY: Past Medical History:  Diagnosis Date  . Allergy   . Depression   . Family history of malignant neoplasm of breast   . Family history of uterine cancer   . Migraine   . UTI (lower urinary tract infection)     PAST SURGICAL HISTORY: Past Surgical History:  Procedure Laterality Date  . BREAST BIOPSY  2011   benign  . BREAST EXCISIONAL BIOPSY Left   . TONSILLECTOMY AND ADENOIDECTOMY  1993    FAMILY HISTORY Family History  Problem Relation Age of Onset  . Cancer Mother        uterine @ 51, breast @ 35, and melanoma @ 71  . Hypertension Mother   . Asthma Mother   . Arrhythmia Mother   . Hyperlipidemia Father   . Hypertension Father   . Diabetes Father   . Heart block Father        had angioplasty  . Cancer Maternal Grandfather        lung; smoker; deceased 21  . Cancer Paternal Grandmother 28       colon cancer; deceased 51  . Alcohol abuse Paternal Grandfather   . Cancer Maternal Aunt  skin & colon polyps (#/type?)  . Cancer Maternal Uncle        skin  . Cancer Paternal Aunt 92       breast; currently 56  . Migraines Other        paternal side  . Breast cancer Neg Hx    the patient's mother was diagnosed with cancer of the uterus at age 80, cancer of the breast at age 67, and melanoma at age 64. This may well represent a p53 mutation, but there are many other possibilities. Unfortunately the patient's mother, who is still living, refuses to be genetically tested. The patient's maternal grandfather had a  history of brain and lung cancer (these apparently were 2 separate primaries). He was a smoker. He died at age 18. 23 of the patient's mother's siblings had skin cancers, but not melanomas. On the paternal side the paternal grandmother was diagnosed with colon cancer at age 78 and a paternal aunt was diagnosed with breast cancer at age 45. The patient's father has a history of precancerous polyps.   GYNECOLOGIC HISTORY:  No LMP recorded. Menarche age 12. The patient is GX P0. She understands that waiting after age 78 to carry a child essentially doubles the risk of breast cancer.   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 She also has a second job at [employer].  She is planning to get a degree throughout (virtually) and school administration.  That may start January 2021.  She is single, and lives alone, with no pets    ADVANCED DIRECTIVES: Not in place. At her 04/26/2014 visit she was given the appropriate documents to complete and notarize at her discretion.   HEALTH MAINTENANCE: Social History   Tobacco Use  . Smoking status: Never Smoker  . Smokeless tobacco: Never Used  Substance Use Topics  . Alcohol use: Yes    Alcohol/week: 4.0 standard drinks    Types: 4 Standard drinks or equivalent per week  . Drug use: No     Colonoscopy: Never  PAP: March 2015  Bone density: Never  Lipid panel:  Allergies  Allergen Reactions  . Codone [Hydrocodone]   . Penicillins   . Sulfur     Current Outpatient Medications  Medication Sig Dispense Refill  . albuterol (PROAIR HFA) 108 (90 Base) MCG/ACT inhaler Inhale 2 puffs into the lungs every 4 (four) hours as needed for wheezing or shortness of breath (cough). 1 Inhaler 1  . busPIRone (BUSPAR) 15 MG tablet Take 0.5 tablets (7.5 mg total) by mouth 2 (two) times daily. 30 tablet 11  . cetirizine (ZYRTEC) 10 MG tablet Take 10 mg by mouth daily.    Marland Kitchen  escitalopram (LEXAPRO) 20 MG tablet Take 1 tablet (20 mg total) by mouth daily. 30 tablet 11  . fluticasone (FLONASE) 50 MCG/ACT nasal spray SHAKE LIQUID AND USE 2 SPRAYS IN EACH NOSTRIL DAILY 16 g 2  . norethindrone-ethinyl estradiol (JUNEL FE 1/20) 1-20 MG-MCG tablet Take 1 tablet by mouth daily. 84 tablet 4  . OnabotulinumtoxinA (BOTOX IJ) Inject as directed. Reported on 02/23/2015    . SUMAtriptan (IMITREX) 100 MG tablet Take 100 mg by mouth every 2 (two) hours as needed for migraine or headache. Reported on 02/23/2015     No current facility-administered medications for this visit.     OBJECTIVE: Young white woman who appears well  Vitals:   01/11/19 1502  BP: 111/61  Pulse:  66  Resp: 18  Temp: 98.3 F (36.8 C)  SpO2: 100%     Body mass index is 26.29 kg/m.    ECOG FS:0 - Asymptomatic  Sclerae unicteric, EOMs intact Wearing a mask No cervical or supraclavicular adenopathy Lungs no rales or rhonchi Heart regular rate and rhythm Abd soft, nontender, positive bowel sounds MSK no focal spinal tenderness, no upper extremity lymphedema Neuro: nonfocal, well oriented, appropriate affect Breasts: The right breast is lumpy, consistent with age.  The left breast is a little bit less lumpy.  Neither show any findings suggestive of disease.  Both axillae are benign.   LAB RESULTS:  CMP     Component Value Date/Time   NA 144 08/31/2018 1347   NA 140 01/02/2017 1137   K 4.2 08/31/2018 1347   K 4.0 01/02/2017 1137   CL 105 08/31/2018 1347   CO2 24 08/31/2018 1347   CO2 26 01/02/2017 1137   GLUCOSE 71 08/31/2018 1347   GLUCOSE 89 01/06/2018 1457   GLUCOSE 94 01/02/2017 1137   BUN 8 08/31/2018 1347   BUN 10.8 01/02/2017 1137   CREATININE 0.72 08/31/2018 1347   CREATININE 0.77 01/06/2018 1457   CREATININE 0.8 01/02/2017 1137   CALCIUM 8.9 08/31/2018 1347   CALCIUM 9.2 01/02/2017 1137   PROT 6.2 08/31/2018 1347   PROT 6.6 01/02/2017 1137   ALBUMIN 4.3 08/31/2018 1347    ALBUMIN 3.9 01/02/2017 1137   AST 19 08/31/2018 1347   AST 17 01/06/2018 1457   AST 16 01/02/2017 1137   ALT 15 08/31/2018 1347   ALT 13 01/06/2018 1457   ALT 10 01/02/2017 1137   ALKPHOS 43 08/31/2018 1347   ALKPHOS 50 01/02/2017 1137   BILITOT 0.3 08/31/2018 1347   BILITOT 0.2 (L) 01/06/2018 1457   BILITOT 0.40 01/02/2017 1137   GFRNONAA 107 08/31/2018 1347   GFRNONAA >60 01/06/2018 1457   GFRAA 123 08/31/2018 1347   GFRAA >60 01/06/2018 1457    INo results found for: SPEP, UPEP  Lab Results  Component Value Date   WBC 8.7 08/31/2018   NEUTROABS 4.5 01/06/2018   HGB 13.5 08/31/2018   HCT 40.8 08/31/2018   MCV 89 08/31/2018   PLT 215 08/31/2018      Chemistry      Component Value Date/Time   NA 144 08/31/2018 1347   NA 140 01/02/2017 1137   K 4.2 08/31/2018 1347   K 4.0 01/02/2017 1137   CL 105 08/31/2018 1347   CO2 24 08/31/2018 1347   CO2 26 01/02/2017 1137   BUN 8 08/31/2018 1347   BUN 10.8 01/02/2017 1137   CREATININE 0.72 08/31/2018 1347   CREATININE 0.77 01/06/2018 1457   CREATININE 0.8 01/02/2017 1137      Component Value Date/Time   CALCIUM 8.9 08/31/2018 1347   CALCIUM 9.2 01/02/2017 1137   ALKPHOS 43 08/31/2018 1347   ALKPHOS 50 01/02/2017 1137   AST 19 08/31/2018 1347   AST 17 01/06/2018 1457   AST 16 01/02/2017 1137   ALT 15 08/31/2018 1347   ALT 13 01/06/2018 1457   ALT 10 01/02/2017 1137   BILITOT 0.3 08/31/2018 1347   BILITOT 0.2 (L) 01/06/2018 1457   BILITOT 0.40 01/02/2017 1137       No results found for: LABCA2  No components found for: LABCA125  No results for input(s): INR in the last 168 hours.  Urinalysis    Component Value Date/Time   BILIRUBINUR negative 06/14/2014 1630  PROTEINUR negative 06/14/2014 1630   UROBILINOGEN 0.2 06/14/2014 1630   NITRITE negative 06/14/2014 1630   LEUKOCYTESUR large (3+) 06/14/2014 1630    STUDIES: No results found.   ASSESSMENT: 38 y.o. [place] woman with a high-risk family  history for breast, uterine, melanoma and colon cancers  (1) genetics testing 05/27/2014 through the BRCA 1 and 2 testing 05/07/2010 negative (BARTs not sent)  (2) genetics testing 05/05/2014 through the Early gene panel at [facility] showed no deleterious mutations in ATM, BARD1, BRCA1, BRCA2, BRIP1, CDH1, CHEK2, MRE11A, MUTYH, NBN, NF1, PALB2, PTEN, RAD50, RAD51C, RAD51D, and TP53  (3) intensified screening plan: Initiated June 2015  (a) yearly gynecology visits/ Sabra Heck  (b) yearly mammography/yearly MRI, 6 months apart  (c) regular dermatology screening/ [place]  (d) annual ophthalmology exam/ [place]:  Cathye is comfortable with our intensified screening schedule.  She pays about $400 out-of-pocket for her MRI at present.  She feels she cannot afford that.  She will have her next mammogram in February and her next breast MRI in August.  Shortly after that she will see Dr. Sabra Heck.  She will return to see me in November of next year.  She is keeping appropriate pandemic precautions.  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   Magrinat, Virgie Dad, MD  01/11/19 3:24 PM Medical Oncology and Hematology [facility] [place],  [address] Tel. [phone]    Fax. [phone]   I, Wilburn Mylar, am acting as scribe for Dr. Virgie Dad. Magrinat.  I, Lurline Del MD, have reviewed the above documentation for accuracy and completeness, and I agree with the above.

## 2019-01-11 ENCOUNTER — Other Ambulatory Visit: Payer: Self-pay

## 2019-01-11 ENCOUNTER — Inpatient Hospital Stay: Payer: BC Managed Care – PPO | Attending: Oncology | Admitting: Oncology

## 2019-01-11 VITALS — BP 111/61 | HR 66 | Temp 98.3°F | Resp 18 | Ht 63.5 in | Wt 150.8 lb

## 2019-01-11 DIAGNOSIS — Z808 Family history of malignant neoplasm of other organs or systems: Secondary | ICD-10-CM | POA: Insufficient documentation

## 2019-01-11 DIAGNOSIS — Z800 Family history of malignant neoplasm of digestive organs: Secondary | ICD-10-CM | POA: Diagnosis present

## 2019-01-11 DIAGNOSIS — Z1239 Encounter for other screening for malignant neoplasm of breast: Secondary | ICD-10-CM | POA: Diagnosis not present

## 2019-01-11 DIAGNOSIS — Z1231 Encounter for screening mammogram for malignant neoplasm of breast: Secondary | ICD-10-CM

## 2019-01-11 DIAGNOSIS — Z129 Encounter for screening for malignant neoplasm, site unspecified: Secondary | ICD-10-CM | POA: Diagnosis not present

## 2019-01-12 ENCOUNTER — Telehealth: Payer: Self-pay | Admitting: Oncology

## 2019-01-12 NOTE — Telephone Encounter (Signed)
I left a message regarding schedule  

## 2019-01-12 NOTE — Progress Notes (Signed)
Can you call pt and move AEX appt into late June or early July to keep her on the one year schedule?  Thanks.

## 2019-01-20 ENCOUNTER — Ambulatory Visit (INDEPENDENT_AMBULATORY_CARE_PROVIDER_SITE_OTHER): Payer: BC Managed Care – PPO | Admitting: Psychology

## 2019-01-20 DIAGNOSIS — F419 Anxiety disorder, unspecified: Secondary | ICD-10-CM | POA: Diagnosis not present

## 2019-02-03 ENCOUNTER — Ambulatory Visit (INDEPENDENT_AMBULATORY_CARE_PROVIDER_SITE_OTHER): Payer: BC Managed Care – PPO | Admitting: Psychology

## 2019-02-03 DIAGNOSIS — F413 Other mixed anxiety disorders: Secondary | ICD-10-CM

## 2019-02-09 ENCOUNTER — Telehealth: Payer: Self-pay

## 2019-02-09 NOTE — Telephone Encounter (Signed)
Tried calling patient to move AEX appointment up to June or July 2021. No answer, left message on voicemail for patient to call our office back at [phone].

## 2019-02-11 NOTE — Telephone Encounter (Signed)
Patient rescheduled aex to 09/03/19 at 10:30 am.

## 2019-02-18 ENCOUNTER — Ambulatory Visit (INDEPENDENT_AMBULATORY_CARE_PROVIDER_SITE_OTHER): Payer: BC Managed Care – PPO | Admitting: Psychology

## 2019-02-18 DIAGNOSIS — F413 Other mixed anxiety disorders: Secondary | ICD-10-CM

## 2019-03-01 ENCOUNTER — Ambulatory Visit (INDEPENDENT_AMBULATORY_CARE_PROVIDER_SITE_OTHER): Payer: BC Managed Care – PPO | Admitting: Psychology

## 2019-03-01 DIAGNOSIS — F419 Anxiety disorder, unspecified: Secondary | ICD-10-CM | POA: Diagnosis not present

## 2019-03-17 ENCOUNTER — Ambulatory Visit (INDEPENDENT_AMBULATORY_CARE_PROVIDER_SITE_OTHER): Payer: BC Managed Care – PPO | Admitting: Psychology

## 2019-03-17 DIAGNOSIS — F419 Anxiety disorder, unspecified: Secondary | ICD-10-CM | POA: Diagnosis not present

## 2019-03-31 ENCOUNTER — Ambulatory Visit: Payer: BC Managed Care – PPO | Admitting: Psychology

## 2019-04-02 ENCOUNTER — Ambulatory Visit (INDEPENDENT_AMBULATORY_CARE_PROVIDER_SITE_OTHER): Payer: BC Managed Care – PPO | Admitting: Psychology

## 2019-04-02 DIAGNOSIS — F419 Anxiety disorder, unspecified: Secondary | ICD-10-CM | POA: Diagnosis not present

## 2019-04-07 ENCOUNTER — Ambulatory Visit: Payer: BC Managed Care – PPO | Admitting: Psychology

## 2019-04-14 ENCOUNTER — Ambulatory Visit (INDEPENDENT_AMBULATORY_CARE_PROVIDER_SITE_OTHER): Payer: BC Managed Care – PPO | Admitting: Psychology

## 2019-04-14 DIAGNOSIS — F419 Anxiety disorder, unspecified: Secondary | ICD-10-CM

## 2019-04-21 ENCOUNTER — Ambulatory Visit: Payer: BC Managed Care – PPO | Admitting: Psychology

## 2019-04-28 ENCOUNTER — Ambulatory Visit (INDEPENDENT_AMBULATORY_CARE_PROVIDER_SITE_OTHER): Payer: BC Managed Care – PPO | Admitting: Psychology

## 2019-04-28 DIAGNOSIS — F419 Anxiety disorder, unspecified: Secondary | ICD-10-CM | POA: Diagnosis not present

## 2019-05-05 ENCOUNTER — Ambulatory Visit: Payer: BC Managed Care – PPO | Admitting: Psychology

## 2019-05-07 ENCOUNTER — Other Ambulatory Visit: Payer: Self-pay

## 2019-05-07 ENCOUNTER — Ambulatory Visit
Admission: RE | Admit: 2019-05-07 | Discharge: 2019-05-07 | Disposition: A | Discharge status: Home / Self Care | Payer: BC Managed Care – PPO | Source: Ambulatory Visit | Attending: Oncology | Admitting: Oncology

## 2019-05-07 DIAGNOSIS — Z1231 Encounter for screening mammogram for malignant neoplasm of breast: Secondary | ICD-10-CM

## 2019-05-07 DIAGNOSIS — Z1239 Encounter for other screening for malignant neoplasm of breast: Secondary | ICD-10-CM

## 2019-05-12 ENCOUNTER — Ambulatory Visit (INDEPENDENT_AMBULATORY_CARE_PROVIDER_SITE_OTHER): Payer: BC Managed Care – PPO | Admitting: Psychology

## 2019-05-12 DIAGNOSIS — F419 Anxiety disorder, unspecified: Secondary | ICD-10-CM

## 2019-05-19 ENCOUNTER — Ambulatory Visit: Payer: BC Managed Care – PPO | Admitting: Psychology

## 2019-05-26 ENCOUNTER — Ambulatory Visit (INDEPENDENT_AMBULATORY_CARE_PROVIDER_SITE_OTHER): Payer: BC Managed Care – PPO | Admitting: Psychology

## 2019-05-26 DIAGNOSIS — F419 Anxiety disorder, unspecified: Secondary | ICD-10-CM | POA: Diagnosis not present

## 2019-05-31 ENCOUNTER — Encounter: Payer: Self-pay | Admitting: Family Medicine

## 2019-06-01 MED ORDER — ESCITALOPRAM OXALATE 20 MG PO TABS: 20.0000 mg | ORAL_TABLET | Freq: Every day | ORAL | 0 refills | Status: DC

## 2019-06-02 ENCOUNTER — Ambulatory Visit: Payer: BC Managed Care – PPO | Admitting: Psychology

## 2019-06-08 ENCOUNTER — Other Ambulatory Visit: Payer: Self-pay

## 2019-06-08 ENCOUNTER — Ambulatory Visit (INDEPENDENT_AMBULATORY_CARE_PROVIDER_SITE_OTHER): Payer: BC Managed Care – PPO | Admitting: Family Medicine

## 2019-06-08 ENCOUNTER — Encounter: Payer: Self-pay | Admitting: Family Medicine

## 2019-06-08 VITALS — BP 108/72 | HR 88 | Temp 98.1°F | Ht 63.5 in | Wt 156.2 lb

## 2019-06-08 DIAGNOSIS — F411 Generalized anxiety disorder: Secondary | ICD-10-CM | POA: Diagnosis not present

## 2019-06-08 MED ORDER — ESCITALOPRAM OXALATE 20 MG PO TABS: 20.0000 mg | ORAL_TABLET | Freq: Every day | ORAL | 11 refills | Status: DC

## 2019-06-08 MED ORDER — BUSPIRONE HCL 15 MG PO TABS: 7.5000 mg | ORAL_TABLET | Freq: Two times a day (BID) | ORAL | 11 refills | Status: DC

## 2019-06-08 NOTE — Assessment & Plan Note (Signed)
Doing much better with escitalopram and buspirone as well as counseling  Will continue all of the above in light of inc stressor of grad school  Reviewed stressors/ coping techniques/symptoms/ support sources/ tx options and side effects in detail today Reviewed self care  Enc less coffee intake Enc start of more exercise

## 2019-06-08 NOTE — Progress Notes (Signed)
Subjective:    Patient ID: Kirsten Wilson, female    DOB: 1980-04-09, 39 y.o.   MRN: [id number]  This visit occurred during the SARS-CoV-2 public health emergency.  Safety protocols were in place, including screening questions prior to the visit, additional usage of staff PPE, and extensive cleaning of exam room while observing appropriate contact time as indicated for disinfecting solutions.    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   Had covid vaccines    Not a lot of time for self care   Exercise has waned - since school started back but plans to start   Eating very healthy  She meal preps -this helps   H/o anxiety and depression in the past  Taking lexapro 20 mg - helping Added buspar 7.5 bid a year ago (helps as well) Overall doing better  She is in a good place / wants to keep this up   Stress has kicked up   Counseling - still going for CBT every 2 weeks  This is helpful  Encouraged to care for herself   Still has sleep issues (wakes up)  Uses meditation app   Migraines- did increase a bit with stress Sees Dr Isaiah Serge botox   Patient Active Problem List   Diagnosis Date Noted  . Generalized anxiety disorder 06/06/2017  . 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 Cervical polyp 08/26/2014  . Urinary frequency 06/14/2014  . Genetic testing 05/27/2014  . Family history of uterine cancer   . Breast cancer screening, high risk patient 03/30/2014  . Allergic rhinitis 03/30/2014  . Migraine 03/30/2014  . History of depression 03/30/2014  . Family history of breast cancer 03/30/2014   Past Medical History:  Diagnosis Date  . Allergy   . Depression   . Family history of malignant neoplasm of  breast   . Family history of uterine cancer   . Migraine   . UTI (lower urinary tract infection)    Past Surgical History:  Procedure Laterality Date  . BREAST BIOPSY  2011   benign  . BREAST EXCISIONAL BIOPSY Left   . TONSILLECTOMY AND ADENOIDECTOMY  1993   Social History   Tobacco Use  . Smoking status: Never Smoker  . Smokeless tobacco: Never Used  Substance Use Topics  . Alcohol use: Yes    Alcohol/week: 4.0 standard drinks    Types: 4 Standard drinks or equivalent per week  . Drug use: No   Family History  Problem Relation Age of Onset  . Cancer Mother        uterine @ 64, breast @ 47, and melanoma @ 68  . Hypertension Mother   . Asthma Mother   . Arrhythmia Mother   . Hyperlipidemia Father   . Hypertension Father   . Diabetes Father   . Heart block Father        had angioplasty  . Cancer Maternal Grandfather        lung; smoker; deceased 97  . Cancer Paternal Grandmother 35       colon cancer; deceased 31  . Alcohol abuse Paternal Grandfather   . Cancer Maternal Aunt  skin & colon polyps (#/type?)  . Cancer Maternal Uncle        skin  . Cancer Paternal Aunt 68       breast; currently 62  . Migraines Other        paternal side  . Breast cancer Neg Hx    Allergies  Allergen Reactions  . Codone [Hydrocodone]   . Penicillins   . Sulfur    Current Outpatient Medications on File Prior to Visit  Medication Sig Dispense Refill  . albuterol (PROAIR HFA) 108 (90 Base) MCG/ACT inhaler Inhale 2 puffs into the lungs every 4 (four) hours as needed for wheezing or shortness of breath (cough). 1 Inhaler 1  . cetirizine (ZYRTEC) 10 MG tablet Take 10 mg by mouth daily.    . fluticasone (FLONASE) 50 MCG/ACT nasal spray SHAKE LIQUID AND USE 2 SPRAYS IN EACH NOSTRIL DAILY 16 g 2  . norethindrone-ethinyl estradiol (JUNEL FE 1/20) 1-20 MG-MCG tablet Take 1 tablet by mouth daily. 84 tablet 4  . OnabotulinumtoxinA (BOTOX IJ) Inject as directed. Reported on 02/23/2015     . spironolactone (ALDACTONE) 100 MG tablet Take 1 tablet by mouth daily.    . SUMAtriptan (IMITREX) 100 MG tablet Take 100 mg by mouth every 2 (two) hours as needed for migraine or headache. Reported on 02/23/2015     No current facility-administered medications on file prior to visit.    Review of Systems  Constitutional: Negative for activity change, appetite change, fatigue, fever and unexpected weight change.  HENT: Negative for congestion, ear pain, rhinorrhea, sinus pressure and sore throat.   Eyes: Negative for pain, redness and visual disturbance.  Respiratory: Negative for cough, shortness of breath and wheezing.   Cardiovascular: Negative for chest pain and palpitations.  Gastrointestinal: Negative for abdominal pain, blood in stool, constipation and diarrhea.  Endocrine: Negative for polydipsia and polyuria.  Genitourinary: Negative for dysuria, frequency and urgency.  Musculoskeletal: Negative for arthralgias, back pain and myalgias.  Skin: Negative for pallor and rash.  Allergic/Immunologic: Negative for environmental allergies.  Neurological: Positive for headaches. Negative for dizziness and syncope.  Hematological: Negative for adenopathy. Does not bruise/bleed easily.  Psychiatric/Behavioral: Positive for sleep disturbance. Negative for confusion, decreased concentration and dysphoric mood. The patient is nervous/anxious.        Objective:   Physical Exam Constitutional:      General: She is not in acute distress.    Appearance: Normal appearance. She is normal weight. She is not ill-appearing.  HENT:     Mouth/Throat:     Mouth: Mucous membranes are moist.  Eyes:     General: No scleral icterus.    Conjunctiva/sclera: Conjunctivae normal.     Pupils: Pupils are equal, round, and reactive to light.  Neck:     Vascular: No carotid bruit.  Cardiovascular:     Rate and Rhythm: Normal rate and regular rhythm.     Heart sounds: Normal heart sounds.  Pulmonary:       Effort: Pulmonary effort is normal. No respiratory distress.     Breath sounds: Normal breath sounds. No wheezing or rales.  Musculoskeletal:     Cervical back: Normal range of motion and neck supple.  Lymphadenopathy:     Cervical: No cervical adenopathy.  Skin:    General: Skin is warm and dry.     Findings: No erythema or rash.  Neurological:     Mental Status: She is alert.     Motor: No tremor.  Coordination: Coordination normal.     Deep Tendon Reflexes: Reflexes normal.  Psychiatric:        Attention and Perception: Attention normal.        Mood and Affect: Mood normal.        Speech: Speech normal.        Behavior: Behavior normal.        Cognition and Memory: Cognition and memory normal.           Assessment & Plan:   Problem List Items Addressed This Visit      Other   Generalized anxiety disorder - Primary    Doing much better with escitalopram and buspirone as well as counseling  Will continue all of the above in light of inc stressor of grad school  Reviewed stressors/ coping techniques/symptoms/ support sources/ tx options and side effects in detail today Reviewed self care  Enc less coffee intake Enc start of more exercise       Relevant Medications   busPIRone (BUSPAR) 15 MG tablet   escitalopram (LEXAPRO) 20 MG tablet

## 2019-06-08 NOTE — Patient Instructions (Addendum)
Continue current medications  Also counseling   Try to find time for exercise  Also good self care   Watch the coffee intake   I'm glad you are doing well

## 2019-06-09 ENCOUNTER — Ambulatory Visit (INDEPENDENT_AMBULATORY_CARE_PROVIDER_SITE_OTHER): Payer: BC Managed Care – PPO | Admitting: Psychology

## 2019-06-09 ENCOUNTER — Ambulatory Visit: Payer: BC Managed Care – PPO | Admitting: Psychology

## 2019-06-09 DIAGNOSIS — F419 Anxiety disorder, unspecified: Secondary | ICD-10-CM | POA: Diagnosis not present

## 2019-06-16 ENCOUNTER — Ambulatory Visit: Payer: BC Managed Care – PPO | Admitting: Psychology

## 2019-06-23 ENCOUNTER — Ambulatory Visit: Payer: BC Managed Care – PPO | Admitting: Psychology

## 2019-06-24 ENCOUNTER — Ambulatory Visit (INDEPENDENT_AMBULATORY_CARE_PROVIDER_SITE_OTHER): Payer: BC Managed Care – PPO | Admitting: Psychology

## 2019-06-24 DIAGNOSIS — F419 Anxiety disorder, unspecified: Secondary | ICD-10-CM | POA: Diagnosis not present

## 2019-06-30 ENCOUNTER — Ambulatory Visit: Payer: BC Managed Care – PPO | Admitting: Psychology

## 2019-07-07 ENCOUNTER — Ambulatory Visit (INDEPENDENT_AMBULATORY_CARE_PROVIDER_SITE_OTHER): Payer: BC Managed Care – PPO | Admitting: Psychology

## 2019-07-07 DIAGNOSIS — F419 Anxiety disorder, unspecified: Secondary | ICD-10-CM | POA: Diagnosis not present

## 2019-07-14 ENCOUNTER — Ambulatory Visit: Payer: BC Managed Care – PPO | Admitting: Psychology

## 2019-07-21 ENCOUNTER — Ambulatory Visit: Payer: BC Managed Care – PPO | Admitting: Psychology

## 2019-07-22 ENCOUNTER — Ambulatory Visit (INDEPENDENT_AMBULATORY_CARE_PROVIDER_SITE_OTHER): Payer: BC Managed Care – PPO | Admitting: Psychology

## 2019-07-22 DIAGNOSIS — F419 Anxiety disorder, unspecified: Secondary | ICD-10-CM | POA: Diagnosis not present

## 2019-07-28 ENCOUNTER — Ambulatory Visit: Payer: BC Managed Care – PPO | Admitting: Psychology

## 2019-08-04 ENCOUNTER — Ambulatory Visit (INDEPENDENT_AMBULATORY_CARE_PROVIDER_SITE_OTHER): Payer: BC Managed Care – PPO | Admitting: Psychology

## 2019-08-04 DIAGNOSIS — F419 Anxiety disorder, unspecified: Secondary | ICD-10-CM | POA: Diagnosis not present

## 2019-08-16 ENCOUNTER — Ambulatory Visit (INDEPENDENT_AMBULATORY_CARE_PROVIDER_SITE_OTHER): Payer: BC Managed Care – PPO | Admitting: Psychology

## 2019-08-16 DIAGNOSIS — F419 Anxiety disorder, unspecified: Secondary | ICD-10-CM

## 2019-09-01 ENCOUNTER — Ambulatory Visit: Payer: BC Managed Care – PPO | Admitting: Psychology

## 2019-09-02 ENCOUNTER — Ambulatory Visit (INDEPENDENT_AMBULATORY_CARE_PROVIDER_SITE_OTHER): Payer: BC Managed Care – PPO | Admitting: Psychology

## 2019-09-02 DIAGNOSIS — F419 Anxiety disorder, unspecified: Secondary | ICD-10-CM

## 2019-09-03 ENCOUNTER — Ambulatory Visit: Payer: BC Managed Care – PPO | Admitting: Obstetrics & Gynecology

## 2019-09-15 ENCOUNTER — Ambulatory Visit: Payer: BC Managed Care – PPO | Admitting: Psychology

## 2019-09-16 ENCOUNTER — Ambulatory Visit (INDEPENDENT_AMBULATORY_CARE_PROVIDER_SITE_OTHER): Payer: BC Managed Care – PPO | Admitting: Psychology

## 2019-09-16 ENCOUNTER — Telehealth: Payer: Self-pay | Admitting: *Deleted

## 2019-09-16 ENCOUNTER — Encounter: Payer: Self-pay | Admitting: Obstetrics & Gynecology

## 2019-09-16 DIAGNOSIS — F419 Anxiety disorder, unspecified: Secondary | ICD-10-CM

## 2019-09-16 NOTE — Telephone Encounter (Signed)
[name] My yearly appointment has gotten pushed back to Feb. 11th. I am on the list for cancel but that is going to put the ultrasound way out. I did not know if you wanted to try to schedule the yearly ultrasound earlier or just do both together in February. 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

## 2019-09-17 NOTE — Telephone Encounter (Signed)
Spoke with patient. AEX rescheduled to 11/09/19 at 4pm with Dr. Sabra Heck.   Routing to provider for final review. Patient is agreeable to disposition. Will close encounter.

## 2019-09-24 ENCOUNTER — Ambulatory Visit
Admission: RE | Admit: 2019-09-24 | Discharge: 2019-09-24 | Disposition: A | Discharge status: Home / Self Care | Payer: BC Managed Care – PPO | Source: Ambulatory Visit | Attending: Oncology | Admitting: Oncology

## 2019-09-24 ENCOUNTER — Other Ambulatory Visit: Payer: Self-pay

## 2019-09-24 DIAGNOSIS — Z1231 Encounter for screening mammogram for malignant neoplasm of breast: Secondary | ICD-10-CM

## 2019-09-24 MED ORDER — GADOBUTROL 1 MMOL/ML IV SOLN
6.0000 mL | Freq: Once | INTRAVENOUS | Status: AC | PRN
  Administered 2019-09-24: 6 mL via INTRAVENOUS

## 2019-09-27 ENCOUNTER — Encounter: Payer: Self-pay | Admitting: Oncology

## 2019-09-27 ENCOUNTER — Other Ambulatory Visit: Payer: Self-pay | Admitting: Oncology

## 2019-09-29 ENCOUNTER — Ambulatory Visit (INDEPENDENT_AMBULATORY_CARE_PROVIDER_SITE_OTHER): Payer: BC Managed Care – PPO | Admitting: Psychology

## 2019-09-29 DIAGNOSIS — F419 Anxiety disorder, unspecified: Secondary | ICD-10-CM | POA: Diagnosis not present

## 2019-10-05 ENCOUNTER — Ambulatory Visit: Payer: BC Managed Care – PPO | Admitting: Obstetrics & Gynecology

## 2019-10-13 ENCOUNTER — Ambulatory Visit: Payer: BC Managed Care – PPO | Admitting: Psychology

## 2019-10-14 ENCOUNTER — Ambulatory Visit (INDEPENDENT_AMBULATORY_CARE_PROVIDER_SITE_OTHER): Payer: BC Managed Care – PPO | Admitting: Psychology

## 2019-10-14 DIAGNOSIS — F419 Anxiety disorder, unspecified: Secondary | ICD-10-CM

## 2019-10-26 ENCOUNTER — Other Ambulatory Visit: Payer: Self-pay | Admitting: Obstetrics & Gynecology

## 2019-10-26 NOTE — Telephone Encounter (Signed)
Medication refill request: Aurovela FE Last AEX:  08/31/18 SM Next AEX: 11/09/19 Last MMG (if hormonal medication request): 05/07/19 BIRADS 1 negative/density d 09/24/19 Bilateral Breast MRI - BIRADS 2 benign Refill authorized: Today, please advise; order pended #28 w/0 refills if appropriate until patient's AEX

## 2019-10-27 ENCOUNTER — Ambulatory Visit: Payer: BC Managed Care – PPO | Admitting: Psychology

## 2019-11-02 ENCOUNTER — Ambulatory Visit: Payer: BC Managed Care – PPO | Admitting: Obstetrics & Gynecology

## 2019-11-05 NOTE — Progress Notes (Signed)
39 y.o. G0P0000 Single White or Caucasian female here for annual exam.  Doing well.  Work has been busy.  Cycles are regular.  On OCPs.    Seeing Dr. Jana Hakim in November.  Had MMG and MRI done 05/2019 and 09/2019.    Is on spironolactone due to acne.  This has really helped.    Patient's last menstrual period was 11/02/2019.          Sexually active: No.  The current method of family planning is OCP (estrogen/progesterone).    Exercising: Yes.    2 days a week Smoker:  no  Health Maintenance: Pap:  08-21-17 neg, 08-31-2018 neg HPV HR neg History of abnormal Pap:  no MMG:  09/2019 breast MRI Colonoscopy:  none BMD:   none TDaP:  2016 Pneumonia vaccine(s):  no Shingrix:   no Hep C testing: not done Screening Labs: 08/2018.  Negative.     reports that she has never smoked. She has never used smokeless tobacco. She reports current alcohol use of about 4.0 standard drinks of alcohol per week. She reports that she does not use drugs.  Past Medical History:  Diagnosis Date  . Allergy   . Depression   . Family history of malignant neoplasm of breast   . Family history of uterine cancer   . Migraine   . UTI (lower urinary tract infection)     Past Surgical History:  Procedure Laterality Date  . BREAST BIOPSY  2011   benign  . BREAST EXCISIONAL BIOPSY Left   . TONSILLECTOMY AND ADENOIDECTOMY  1993    Current Outpatient Medications  Medication Sig Dispense Refill  . albuterol (PROAIR HFA) 108 (90 Base) MCG/ACT inhaler Inhale 2 puffs into the lungs every 4 (four) hours as needed for wheezing or shortness of breath (cough). 1 Inhaler 1  . AUROVELA FE 1/20 1-20 MG-MCG tablet TAKE 1 TABLET BY MOUTH DAILY 84 tablet 0  . busPIRone (BUSPAR) 15 MG tablet Take 0.5 tablets (7.5 mg total) by mouth 2 (two) times daily. 30 tablet 11  . cetirizine (ZYRTEC) 10 MG tablet Take 10 mg by mouth daily.    Marland Kitchen escitalopram (LEXAPRO) 20 MG tablet Take 1 tablet (20 mg total) by mouth daily. 30 tablet 11  .  fluticasone (FLONASE) 50 MCG/ACT nasal spray SHAKE LIQUID AND USE 2 SPRAYS IN EACH NOSTRIL DAILY 16 g 2  . OnabotulinumtoxinA (BOTOX IJ) Inject as directed. Reported on 02/23/2015    . spironolactone (ALDACTONE) 100 MG tablet Take 1 tablet by mouth daily.    . SUMAtriptan (IMITREX) 100 MG tablet Take 100 mg by mouth every 2 (two) hours as needed for migraine or headache. Reported on 02/23/2015     No current facility-administered medications for this visit.    Family History  Problem Relation Age of Onset  . Cancer Mother        uterine @ 74, breast @ 45, and melanoma @ 9  . Hypertension Mother   . Asthma Mother   . Arrhythmia Mother   . Hyperlipidemia Father   . Hypertension Father   . Diabetes Father   . Heart block Father        had angioplasty  . Cancer Maternal Grandfather        lung; smoker; deceased 100  . Cancer Paternal Grandmother 89       colon cancer; deceased 63  . Alcohol abuse Paternal Grandfather   . Cancer Maternal Aunt  skin & colon polyps (#/type?)  . Cancer Maternal Uncle        skin  . Cancer Paternal Aunt 86       breast; currently 70  . Migraines Other        paternal side  . Breast cancer Neg Hx     Review of Systems  All other systems reviewed and are negative.   Exam:   BP 122/80 (BP Location: Right Arm, Patient Position: Sitting, Cuff Size: Normal)   Pulse 76   Resp 14   Ht 5' 3.25" (1.607 m)   Wt 160 lb (72.6 kg)   LMP 11/02/2019   BMI 28.12 kg/m   Height: 5' 3.25" (160.7 cm)  General appearance: alert, cooperative and appears stated age Head: Normocephalic, without obvious abnormality, atraumatic Neck: no adenopathy, supple, symmetrical, trachea midline and thyroid normal to inspection and palpation Lungs: clear to auscultation bilaterally Breasts: normal appearance, no masses or tenderness Heart: regular rate and rhythm Abdomen: soft, non-tender; bowel sounds normal; no masses,  no organomegaly Extremities: extremities  normal, atraumatic, no cyanosis or edema Skin: Skin color, texture, turgor normal. No rashes or lesions Lymph nodes: Cervical, supraclavicular, and axillary nodes normal. No abnormal inguinal nodes palpated Neurologic: Grossly normal   Pelvic: External genitalia:  no lesions              Urethra:  normal appearing urethra with no masses, tenderness or lesions              Bartholins and Skenes: normal                 Vagina: normal appearing vagina with normal color and discharge, no lesions              Cervix: no lesions              Pap taken: No. Bimanual Exam:  Uterus:  normal size, contour, position, consistency, mobility, non-tender              Adnexa: normal adnexa and no mass, fullness, tenderness               Rectovaginal: Confirms               Anus:  normal sphincter tone, no lesions  Chaperone, Terence Lux, CMA, was present for exam.  A:  Well Woman with normal exam Strong family history of breast, uterine cancer (mother diagnosed at age 58) and melanoma (pt had negative BRCA 1/2 testing completed) Paternal family hx of colon cancer--paternal GM Acne, on spironolactone   P:   Mammogram and MRI are being done in 6 month interval. pap smear guidelines reviewed.  Neg HR HPV testing 2020.  Doing yearly pap smears due to family hx of endometrial cancer Return for PUS RF for OCPs #3 months supply/4RF done today. No lab work obtained today Will continue with yearly exams with Dr. Jana Hakim return annually or prn

## 2019-11-09 ENCOUNTER — Other Ambulatory Visit: Payer: Self-pay

## 2019-11-09 ENCOUNTER — Encounter: Payer: Self-pay | Admitting: Obstetrics & Gynecology

## 2019-11-09 ENCOUNTER — Ambulatory Visit: Payer: BC Managed Care – PPO | Admitting: Obstetrics & Gynecology

## 2019-11-09 ENCOUNTER — Other Ambulatory Visit (HOSPITAL_COMMUNITY)
Admission: RE | Admit: 2019-11-09 | Discharge: 2019-11-09 | Disposition: A | Discharge status: Home / Self Care | Payer: BC Managed Care – PPO | Source: Ambulatory Visit | Attending: Obstetrics & Gynecology | Admitting: Obstetrics & Gynecology

## 2019-11-09 VITALS — BP 122/80 | HR 76 | Resp 14 | Ht 63.25 in | Wt 160.0 lb

## 2019-11-09 DIAGNOSIS — Z8049 Family history of malignant neoplasm of other genital organs: Secondary | ICD-10-CM | POA: Diagnosis present

## 2019-11-09 DIAGNOSIS — Z124 Encounter for screening for malignant neoplasm of cervix: Secondary | ICD-10-CM

## 2019-11-09 DIAGNOSIS — Z01419 Encounter for gynecological examination (general) (routine) without abnormal findings: Secondary | ICD-10-CM | POA: Diagnosis not present

## 2019-11-09 MED ORDER — NORETHIN ACE-ETH ESTRAD-FE 1-20 MG-MCG PO TABS: 1.0000 | ORAL_TABLET | Freq: Every day | ORAL | 4 refills | Status: DC

## 2019-11-10 ENCOUNTER — Ambulatory Visit (INDEPENDENT_AMBULATORY_CARE_PROVIDER_SITE_OTHER): Payer: BC Managed Care – PPO | Admitting: Psychology

## 2019-11-10 ENCOUNTER — Telehealth: Payer: Self-pay | Admitting: Obstetrics & Gynecology

## 2019-11-10 DIAGNOSIS — F419 Anxiety disorder, unspecified: Secondary | ICD-10-CM | POA: Diagnosis not present

## 2019-11-10 LAB — CYTOLOGY - PAP: Diagnosis: NEGATIVE

## 2019-11-10 NOTE — Telephone Encounter (Signed)
Call to patient. Per DPR, OK to leave message on voicemail.  Left voicemail requesting a return call to [name] to review benefits and schedule recommended Pelvic ultrasound with M. Suzanne Miller, MD 

## 2019-11-10 NOTE — Telephone Encounter (Signed)
Patient left message returning call to [facility].

## 2019-11-12 NOTE — Telephone Encounter (Signed)
Spoke with patient regarding benefits for recommended ultrasound. Patient is aware that ultrasound is transvaginal. Patient acknowledges understanding of information presented. Patient is aware of cancellation policy. Patient scheduled appointment for 12/02/2019 at 0230PM with M. Edwinna Areola, MD. Encounter closed.

## 2019-11-12 NOTE — Telephone Encounter (Signed)
Call to patient. Per DPR, OK to leave message on voicemail.  Left voicemail requesting a return call to [name] to review benefits and schedule recommended Pelvic ultrasound with M. Suzanne Miller, MD 

## 2019-11-24 ENCOUNTER — Ambulatory Visit: Payer: BC Managed Care – PPO | Admitting: Psychology

## 2019-11-26 ENCOUNTER — Ambulatory Visit (INDEPENDENT_AMBULATORY_CARE_PROVIDER_SITE_OTHER): Payer: BC Managed Care – PPO | Admitting: Psychology

## 2019-11-26 DIAGNOSIS — F419 Anxiety disorder, unspecified: Secondary | ICD-10-CM | POA: Diagnosis not present

## 2019-12-02 ENCOUNTER — Other Ambulatory Visit: Payer: Self-pay

## 2019-12-02 ENCOUNTER — Encounter: Payer: Self-pay | Admitting: Obstetrics & Gynecology

## 2019-12-02 ENCOUNTER — Ambulatory Visit (INDEPENDENT_AMBULATORY_CARE_PROVIDER_SITE_OTHER): Payer: BC Managed Care – PPO | Admitting: Obstetrics & Gynecology

## 2019-12-02 ENCOUNTER — Ambulatory Visit (INDEPENDENT_AMBULATORY_CARE_PROVIDER_SITE_OTHER): Payer: BC Managed Care – PPO

## 2019-12-02 VITALS — BP 102/64 | HR 64 | Resp 16 | Wt 160.0 lb

## 2019-12-02 DIAGNOSIS — Z8049 Family history of malignant neoplasm of other genital organs: Secondary | ICD-10-CM | POA: Diagnosis not present

## 2019-12-02 DIAGNOSIS — Z803 Family history of malignant neoplasm of breast: Secondary | ICD-10-CM | POA: Diagnosis not present

## 2019-12-02 NOTE — Progress Notes (Signed)
39 y.o. G0P0000 Single White or Caucasian female here for pelvic ultrasound due to family history of uterine cancer in her mother.  Patient also has family history of breast cancer.  Patient has undergone negative genetic testing.  She is also followed by Dr. Jana Hakim.  Breast MRI in July 2021 was normal.  She is alternating breast MRI and breast mammogram every 6 months.  Patient's last menstrual period was 10/25/2019 (exact date).  Contraception: OCPs  Findings:  UTERUS: 7.9 x 4.8 x 2.7 cm EMS: 3.1 mm ADNEXA: Left ovary: 2.5 x 1.8 x 0.8 cm       Right ovary: 2.6 x 1.6 x 1.3 cm.  Ovaries both have normal follicular patterns CUL DE SAC: No free fluid  Discussion: Ultrasonographer supervised.  Images reviewed with patient.  Endometrial thickness is 3 mm.  Do not feel biopsy is indicated today.  Ovaries appear normal.  We will plan to repeat this in 1 year.  Assessment: Strong family history of both breast and uterine cancer  Plan: Follow-up for annual gynecologic exam and ultrasound 1 year

## 2019-12-08 ENCOUNTER — Ambulatory Visit (INDEPENDENT_AMBULATORY_CARE_PROVIDER_SITE_OTHER): Payer: BC Managed Care – PPO | Admitting: Psychology

## 2019-12-08 DIAGNOSIS — F419 Anxiety disorder, unspecified: Secondary | ICD-10-CM | POA: Diagnosis not present

## 2019-12-11 ENCOUNTER — Other Ambulatory Visit: Payer: Self-pay | Admitting: Dermatology

## 2019-12-22 ENCOUNTER — Ambulatory Visit: Payer: BC Managed Care – PPO | Admitting: Psychology

## 2020-01-05 ENCOUNTER — Ambulatory Visit (INDEPENDENT_AMBULATORY_CARE_PROVIDER_SITE_OTHER): Payer: BC Managed Care – PPO | Admitting: Psychology

## 2020-01-05 DIAGNOSIS — F419 Anxiety disorder, unspecified: Secondary | ICD-10-CM | POA: Diagnosis not present

## 2020-01-13 ENCOUNTER — Inpatient Hospital Stay: Payer: BC Managed Care – PPO

## 2020-01-13 ENCOUNTER — Inpatient Hospital Stay: Payer: BC Managed Care – PPO | Attending: Oncology | Admitting: Oncology

## 2020-01-13 ENCOUNTER — Other Ambulatory Visit: Payer: Self-pay | Admitting: *Deleted

## 2020-01-13 ENCOUNTER — Other Ambulatory Visit: Payer: Self-pay

## 2020-01-13 VITALS — BP 120/82 | HR 66 | Temp 98.1°F | Resp 20 | Ht 63.25 in | Wt 162.9 lb

## 2020-01-13 DIAGNOSIS — Z1239 Encounter for other screening for malignant neoplasm of breast: Secondary | ICD-10-CM

## 2020-01-13 DIAGNOSIS — Z808 Family history of malignant neoplasm of other organs or systems: Secondary | ICD-10-CM | POA: Diagnosis present

## 2020-01-13 DIAGNOSIS — Z803 Family history of malignant neoplasm of breast: Secondary | ICD-10-CM | POA: Insufficient documentation

## 2020-01-13 LAB — CBC WITH DIFFERENTIAL (CANCER CENTER ONLY)
Abs Immature Granulocytes: 0.01 10*3/uL (ref 0.00–0.07)
Basophils Absolute: 0.1 10*3/uL (ref 0.0–0.1)
Basophils Relative: 1 %
Eosinophils Absolute: 0.1 10*3/uL (ref 0.0–0.5)
Eosinophils Relative: 1 %
HCT: 40.1 % (ref 36.0–46.0)
Hemoglobin: 13.6 g/dL (ref 12.0–15.0)
Immature Granulocytes: 0 %
Lymphocytes Relative: 25 %
Lymphs Abs: 2.1 10*3/uL (ref 0.7–4.0)
MCH: 30 pg (ref 26.0–34.0)
MCHC: 33.9 g/dL (ref 30.0–36.0)
MCV: 88.5 fL (ref 80.0–100.0)
Monocytes Absolute: 0.6 10*3/uL (ref 0.1–1.0)
Monocytes Relative: 7 %
Neutro Abs: 5.6 10*3/uL (ref 1.7–7.7)
Neutrophils Relative %: 66 %
Platelet Count: 218 10*3/uL (ref 150–400)
RBC: 4.53 MIL/uL (ref 3.87–5.11)
RDW: 12 % (ref 11.5–15.5)
WBC Count: 8.4 10*3/uL (ref 4.0–10.5)
nRBC: 0 % (ref 0.0–0.2)

## 2020-01-13 LAB — CMP (CANCER CENTER ONLY)
ALT: 14 U/L (ref 0–44)
AST: 19 U/L (ref 15–41)
Albumin: 3.9 g/dL (ref 3.5–5.0)
Alkaline Phosphatase: 44 U/L (ref 38–126)
Anion gap: 6 (ref 5–15)
BUN: 9 mg/dL (ref 6–20)
CO2: 28 mmol/L (ref 22–32)
Calcium: 8.7 mg/dL — ABNORMAL LOW (ref 8.9–10.3)
Chloride: 105 mmol/L (ref 98–111)
Creatinine: 0.76 mg/dL (ref 0.44–1.00)
GFR, Estimated: >60 mL/min (ref >60)
Glucose, Bld: 104 mg/dL — ABNORMAL HIGH (ref 70–99)
Potassium: 3.8 mmol/L (ref 3.5–5.1)
Sodium: 139 mmol/L (ref 135–145)
Total Bilirubin: 0.4 mg/dL (ref 0.3–1.2)
Total Protein: 6.7 g/dL (ref 6.5–8.1)

## 2020-01-13 NOTE — Progress Notes (Signed)
[facility]  [phone] [phone]     ID: Kirsten Wilson DOB: [birth year]  MR#: [id number]  VPC#:[id number]  Patient Care Team: Abner Greenspan, MD as PCP - General (Family Medicine) Xian Alves, Virgie Dad, MD as Consulting Physician (Oncology) Megan Salon, MD as Consulting Physician (Gynecology) Laurence Ferrari, [place], MD as Consulting Physician (Dermatology) OTHER MD:   CHIEF COMPLAINT: high risk family history for breast, uterine and skin cancer  CURRENT TREATMENT: Intensified screening   INTERVAL HISTORY: Kirsten Wilson returns today for follow-up of her intensified screening program given her high risk of breast cancer.   Since her last visit, she underwent bilateral screening mammography with tomography at [facility] on 05/07/2019 showing: breast density category D; no evidence of malignancy in either breast.  She also underwent breast MRI on 09/24/2019 showing: breast composition D; no evidence for malignancy.  She is also followed by Dr. Sabra Heck for her family history of uterine cancer. She most recently underwent pelvic ultrasound on 12/02/2019. These are performed annually.   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  She still has not scheduled her skin and eye exam for this year.   COVID 19 VACCINATION STATUS: s/p [place] x2, most recent;y March 2021   HISTORY OF PRESENT ILLNESS: From the original intake note:  Kirsten Wilson moved to this in the area July 2015, from [place]. She had previously lived here. In [place] she was followed by oncology, dermatology, and gynecology for a family history strongly suggestive of a genetic mutation putting her at increased risk for melanoma, endometrial and breast cancers (her mother had all three cancers at an early age). She did meet with genetic  counseling there and on 05/07/2010 was tested for the BRCA1 and BRCA2 genes, with no mutation detected. A broader panel or BART testing were not performed.  She now presents to our high Risk clinic for futher evaluation and follow-up   PAST MEDICAL HISTORY: Past Medical History:  Diagnosis Date  . Allergy   . Depression   . Family history of malignant neoplasm of breast   . Family history of uterine cancer   . Migraine   . UTI (lower urinary tract infection)     PAST SURGICAL HISTORY: Past Surgical History:  Procedure Laterality Date  . BREAST BIOPSY  2011   benign  . BREAST EXCISIONAL BIOPSY Left   . TONSILLECTOMY AND ADENOIDECTOMY  1993    FAMILY HISTORY Family History  Problem Relation Age of Onset  . Cancer Mother        uterine @ 74, breast @ 69, and melanoma @ 50  . Hypertension Mother   . Asthma Mother   . Arrhythmia Mother   . Hyperlipidemia Father   . Hypertension Father   . Diabetes Father   . Heart block Father        had angioplasty  . Cancer Maternal Grandfather        lung; smoker; deceased 82  . Cancer Paternal Grandmother 75       colon cancer; deceased 4  . Alcohol abuse Paternal Grandfather   . Cancer Maternal Aunt        skin & colon polyps (#/type?)  . Cancer Maternal Uncle        skin  . Cancer Paternal Aunt 53  breast; currently 59  . Migraines Other        paternal side  . Breast cancer Neg Hx    the patient's mother was diagnosed with cancer of the uterus at age 51, cancer of the breast at age 22, and melanoma at age 64. This may well represent a p53 mutation, but there are many other possibilities. Unfortunately the patient's mother, who is still living, refuses to be genetically tested. The patient's maternal grandfather had a history of brain and lung cancer (these apparently were 2 separate primaries). He was a smoker. He died at age 41. 60 of the patient's mother's siblings had skin cancers, but not melanomas. On the paternal side  the paternal grandmother was diagnosed with colon cancer at age 41 and a paternal aunt was diagnosed with breast cancer at age 60. The patient's father has a history of precancerous polyps.   GYNECOLOGIC HISTORY:  No LMP recorded. Menarche age 39. The patient is GX P0. She understands that waiting after age 87 to carry a child essentially doubles the risk of breast cancer.   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  She is planning to get a degree  (virtually) in school administration.  That may start January 2021.  She is single, and lives alone, with no pets    ADVANCED DIRECTIVES: Not in place. At her 04/26/2014 visit she was given the appropriate documents to complete and notarize at her discretion.   HEALTH MAINTENANCE: Social History   Tobacco Use  . Smoking status: Never Smoker  . Smokeless tobacco: Never Used  Vaping Use  . Vaping Use: Never used  Substance Use Topics  . Alcohol use: Yes    Alcohol/week: 4.0 standard drinks    Types: 4 Standard drinks or equivalent per week  . Drug use: No     Colonoscopy: Never  PAP: March 2015  Bone density: Never  Lipid panel:  Allergies  Allergen Reactions  . Codone [Hydrocodone]   . Penicillins   . Sulfur     Current Outpatient Medications  Medication Sig Dispense Refill  . albuterol (PROAIR HFA) 108 (90 Base) MCG/ACT inhaler Inhale 2 puffs into the lungs every 4 (four) hours as needed for wheezing or shortness of breath (cough). 1 Inhaler 1  . busPIRone (BUSPAR) 15 MG tablet Take 0.5 tablets (7.5 mg total) by mouth 2 (two) times daily. 30 tablet 11  . cetirizine (ZYRTEC) 10 MG tablet Take 10 mg by mouth daily.    Marland Kitchen escitalopram (LEXAPRO) 20 MG tablet Take 1 tablet (20 mg total) by mouth  daily. 30 tablet 11  . fluticasone (FLONASE) 50 MCG/ACT nasal spray SHAKE LIQUID AND USE 2 SPRAYS IN EACH NOSTRIL DAILY 16 g 2  . norethindrone-ethinyl estradiol (AUROVELA FE 1/20) 1-20 MG-MCG tablet Take 1 tablet by mouth daily. 84 tablet 4  . OnabotulinumtoxinA (BOTOX IJ) Inject as directed. Reported on 02/23/2015    . spironolactone (ALDACTONE) 100 MG tablet Take 1 tablet by mouth daily.    . SUMAtriptan (IMITREX) 100 MG tablet Take 100 mg by mouth every 2 (two) hours as needed for migraine or headache. Reported on 02/23/2015     No current facility-administered medications for this visit.    OBJECTIVE: White woman who appears stated age  Vitals:   01/13/20 1450  BP: 120/82  Pulse: 66  Resp: 20  Temp: 98.1 F (36.7 C)  SpO2: 100%     Body mass index is 28.63 kg/m.    ECOG FS:0 - Asymptomatic  Sclerae unicteric, EOMs intact Wearing a mask No cervical or supraclavicular adenopathy Lungs no rales or rhonchi Heart regular rate and rhythm Abd soft, nontender, positive bowel sounds MSK no focal spinal tenderness, no upper extremity lymphedema Neuro: nonfocal, well oriented, appropriate affect Breasts: I do not palpate any suspicious masses in either breast.  Both axillae are benign.   LAB RESULTS:  CMP     Component Value Date/Time   NA 139 01/13/2020 1439   NA 144 08/31/2018 1347   NA 140 01/02/2017 1137   K 3.8 01/13/2020 1439   K 4.0 01/02/2017 1137   CL 105 01/13/2020 1439   CO2 28 01/13/2020 1439   CO2 26 01/02/2017 1137   GLUCOSE 104 (H) 01/13/2020 1439   GLUCOSE 94 01/02/2017 1137   BUN 9 01/13/2020 1439   BUN 8 08/31/2018 1347   BUN 10.8 01/02/2017 1137   CREATININE 0.76 01/13/2020 1439   CREATININE 0.8 01/02/2017 1137   CALCIUM 8.7 (L) 01/13/2020 1439   CALCIUM 9.2 01/02/2017 1137   PROT 6.7 01/13/2020 1439   PROT 6.2 08/31/2018 1347   PROT 6.6 01/02/2017 1137   ALBUMIN 3.9 01/13/2020 1439   ALBUMIN 4.3 08/31/2018 1347   ALBUMIN 3.9 01/02/2017 1137     AST 19 01/13/2020 1439   AST 16 01/02/2017 1137   ALT 14 01/13/2020 1439   ALT 10 01/02/2017 1137   ALKPHOS 44 01/13/2020 1439   ALKPHOS 50 01/02/2017 1137   BILITOT 0.4 01/13/2020 1439   BILITOT 0.40 01/02/2017 1137   GFRNONAA >60 01/13/2020 1439   GFRAA 123 08/31/2018 1347   GFRAA >60 01/06/2018 1457    INo results found for: SPEP, UPEP  Lab Results  Component Value Date   WBC 8.4 01/13/2020   NEUTROABS 5.6 01/13/2020   HGB 13.6 01/13/2020   HCT 40.1 01/13/2020   MCV 88.5 01/13/2020   PLT 218 01/13/2020      Chemistry      Component Value Date/Time   NA 139 01/13/2020 1439   NA 144 08/31/2018 1347   NA 140 01/02/2017 1137   K 3.8 01/13/2020 1439   K 4.0 01/02/2017 1137   CL 105 01/13/2020 1439   CO2 28 01/13/2020 1439   CO2 26 01/02/2017 1137   BUN 9 01/13/2020 1439   BUN 8 08/31/2018 1347   BUN 10.8 01/02/2017 1137   CREATININE 0.76 01/13/2020 1439   CREATININE 0.8 01/02/2017 1137      Component Value Date/Time   CALCIUM 8.7 (L) 01/13/2020 1439   CALCIUM 9.2 01/02/2017 1137   ALKPHOS 44 01/13/2020 1439   ALKPHOS 50 01/02/2017 1137   AST 19 01/13/2020 1439   AST 16 01/02/2017 1137   ALT 14 01/13/2020 1439   ALT 10 01/02/2017 1137   BILITOT 0.4 01/13/2020 1439   BILITOT 0.40 01/02/2017 1137       No results found for: LABCA2  No components found for: LABCA125  No results for input(s): INR in the last 168 hours.  Urinalysis    Component Value Date/Time   BILIRUBINUR negative 06/14/2014 1630   PROTEINUR negative 06/14/2014 1630   UROBILINOGEN 0.2 06/14/2014 1630   NITRITE negative 06/14/2014 1630   LEUKOCYTESUR large (3+) 06/14/2014 1630    STUDIES: No  results found.   ASSESSMENT: 39 y.o. [place] woman with a high-risk family history for breast, uterine, melanoma and colon cancers  (1) genetics testing 05/27/2014 through the BRCA 1 and 2 testing 05/07/2010 negative (BARTs not sent)  (2) genetics testing 05/05/2014 through the [facility]  gene panel at [facility] showed no deleterious mutations in ATM, BARD1, BRCA1, BRCA2, BRIP1, CDH1, CHEK2, MRE11A, MUTYH, NBN, NF1, PALB2, PTEN, RAD50, RAD51C, RAD51D, and TP53  (3) intensified screening: Initiated June 2015  (a) yearly gynecology visits/ Sabra Heck  (b) yearly mammography/yearly MRI, 6 months apart  (c) regular dermatology screening/ [facility]  (d) annual ophthalmology exam/ [facility]:  Kirsten Wilson continues in intensified screening.  She is doing very well as far as the mammography and breast MRI is concerned as well also as the elevated ultrasound.  She has not yet scheduled her dermatology screening and her ophthalmology screening for this year.  She tells me now that she is receiving Botox injections she meets her deductible early so she can afford these little extra cost.  The plan then is to continue as we are doing.  Her next mammography and breast MRI orders have been entered  She will see me again in 1 year  Total encounter time 20 minutes.*   Gurley Climer, Virgie Dad, MD  01/13/20 3:38 PM Medical Oncology and Hematology [facility], [place] [address] Tel. [phone]    Fax. [phone]   I, Wilburn Mylar, am acting as scribe for Dr. Virgie Dad. Ezio Wieck.  I, Lurline Del MD, have reviewed the above documentation for accuracy and completeness, and I agree with the above.   *Total Encounter Time as defined by the Centers for Medicare and Medicaid Services includes, in addition to the face-to-face time of a patient visit (documented in the note above) non-face-to-face time: obtaining and reviewing outside history, ordering and reviewing medications, tests or procedures, care coordination (communications with other health care professionals or caregivers) and documentation in the medical record.

## 2020-01-17 ENCOUNTER — Ambulatory Visit (INDEPENDENT_AMBULATORY_CARE_PROVIDER_SITE_OTHER): Payer: BC Managed Care – PPO | Admitting: Psychology

## 2020-01-17 DIAGNOSIS — F419 Anxiety disorder, unspecified: Secondary | ICD-10-CM

## 2020-01-19 ENCOUNTER — Ambulatory Visit: Payer: BC Managed Care – PPO | Admitting: Psychology

## 2020-01-20 ENCOUNTER — Telehealth: Payer: Self-pay | Admitting: Dermatology

## 2020-01-20 NOTE — Telephone Encounter (Signed)
Can you please call to schedule patient for her annual skin cancer screening? Thank you!

## 2020-01-25 ENCOUNTER — Other Ambulatory Visit: Payer: Self-pay | Admitting: Obstetrics & Gynecology

## 2020-02-02 ENCOUNTER — Ambulatory Visit: Payer: BC Managed Care – PPO | Admitting: Psychology

## 2020-02-03 ENCOUNTER — Ambulatory Visit: Payer: BC Managed Care – PPO | Admitting: Psychology

## 2020-02-16 ENCOUNTER — Ambulatory Visit (INDEPENDENT_AMBULATORY_CARE_PROVIDER_SITE_OTHER): Payer: BC Managed Care – PPO | Admitting: Psychology

## 2020-02-16 DIAGNOSIS — F419 Anxiety disorder, unspecified: Secondary | ICD-10-CM

## 2020-03-01 ENCOUNTER — Ambulatory Visit: Payer: BC Managed Care – PPO | Admitting: Psychology

## 2020-03-15 ENCOUNTER — Ambulatory Visit (INDEPENDENT_AMBULATORY_CARE_PROVIDER_SITE_OTHER): Payer: BC Managed Care – PPO | Admitting: Psychology

## 2020-03-15 DIAGNOSIS — F419 Anxiety disorder, unspecified: Secondary | ICD-10-CM | POA: Diagnosis not present

## 2020-04-03 ENCOUNTER — Ambulatory Visit (INDEPENDENT_AMBULATORY_CARE_PROVIDER_SITE_OTHER): Payer: BC Managed Care – PPO | Admitting: Psychology

## 2020-04-03 DIAGNOSIS — F419 Anxiety disorder, unspecified: Secondary | ICD-10-CM | POA: Diagnosis not present

## 2020-04-04 ENCOUNTER — Ambulatory Visit: Payer: Self-pay | Admitting: Psychology

## 2020-04-05 ENCOUNTER — Ambulatory Visit: Payer: BC Managed Care – PPO | Admitting: Psychology

## 2020-04-14 ENCOUNTER — Ambulatory Visit: Payer: BC Managed Care – PPO | Admitting: Obstetrics & Gynecology

## 2020-04-26 ENCOUNTER — Ambulatory Visit (INDEPENDENT_AMBULATORY_CARE_PROVIDER_SITE_OTHER): Payer: BC Managed Care – PPO | Admitting: Psychology

## 2020-04-26 DIAGNOSIS — F419 Anxiety disorder, unspecified: Secondary | ICD-10-CM

## 2020-05-17 ENCOUNTER — Ambulatory Visit (INDEPENDENT_AMBULATORY_CARE_PROVIDER_SITE_OTHER): Payer: BC Managed Care – PPO | Admitting: Psychology

## 2020-05-17 DIAGNOSIS — F419 Anxiety disorder, unspecified: Secondary | ICD-10-CM | POA: Diagnosis not present

## 2020-05-28 ENCOUNTER — Telehealth: Payer: Self-pay | Admitting: Family Medicine

## 2020-05-29 NOTE — Telephone Encounter (Signed)
Pt hasn't been seen in almost a year and no future appts., please schedule CPE or at least a med refill f/u appt, once an appt has been scheduled please route back to me to refill med. Thanks

## 2020-05-29 NOTE — Telephone Encounter (Signed)
I left a detailed message on patient's v.m. to schedule a cpx or a f/u appointment and medication can be refilled up until her appointment.

## 2020-05-31 NOTE — Telephone Encounter (Signed)
I left another detailed message on patient's voice mail.

## 2020-06-01 MED ORDER — BUSPIRONE HCL 15 MG PO TABS: 7.5000 mg | ORAL_TABLET | Freq: Two times a day (BID) | ORAL | 0 refills | Status: DC

## 2020-06-01 NOTE — Addendum Note (Signed)
Addended by: Tammi Sou on: 06/01/2020 08:54 AM   Modules accepted: Orders

## 2020-06-01 NOTE — Telephone Encounter (Signed)
Patient scheduled appointment on 06/09/20.

## 2020-06-01 NOTE — Telephone Encounter (Signed)
Med refilled once  

## 2020-06-06 ENCOUNTER — Ambulatory Visit
Admission: RE | Admit: 2020-06-06 | Discharge: 2020-06-06 | Disposition: A | Discharge status: Home / Self Care | Payer: BC Managed Care – PPO | Source: Ambulatory Visit | Attending: Oncology | Admitting: Oncology

## 2020-06-06 ENCOUNTER — Other Ambulatory Visit: Payer: Self-pay

## 2020-06-06 DIAGNOSIS — Z1239 Encounter for other screening for malignant neoplasm of breast: Secondary | ICD-10-CM

## 2020-06-07 ENCOUNTER — Ambulatory Visit: Payer: BC Managed Care – PPO | Admitting: Psychology

## 2020-06-09 ENCOUNTER — Encounter: Payer: Self-pay | Admitting: Family Medicine

## 2020-06-09 ENCOUNTER — Other Ambulatory Visit: Payer: Self-pay

## 2020-06-09 ENCOUNTER — Ambulatory Visit: Payer: BC Managed Care – PPO | Admitting: Family Medicine

## 2020-06-09 VITALS — BP 122/76 | HR 61 | Temp 96.9°F | Ht 63.0 in | Wt 159.2 lb

## 2020-06-09 DIAGNOSIS — G43109 Migraine with aura, not intractable, without status migrainosus: Secondary | ICD-10-CM | POA: Diagnosis not present

## 2020-06-09 DIAGNOSIS — F411 Generalized anxiety disorder: Secondary | ICD-10-CM

## 2020-06-09 MED ORDER — ESCITALOPRAM OXALATE 20 MG PO TABS: 20.0000 mg | ORAL_TABLET | Freq: Every day | ORAL | 11 refills | Status: DC

## 2020-06-09 MED ORDER — BUSPIRONE HCL 15 MG PO TABS: 7.5000 mg | ORAL_TABLET | Freq: Two times a day (BID) | ORAL | 11 refills | Status: DC

## 2020-06-09 NOTE — Patient Instructions (Addendum)
You can try benadryl 25 mg for trouble sleeping   Continue counseling  No change in medicines   Gradually practice self care - exercise /outdoor time

## 2020-06-09 NOTE — Progress Notes (Signed)
Subjective:    Patient ID: Kirsten Wilson, female    DOB: May 22, 1980, 40 y.o.   MRN: [id number]  This visit occurred during the SARS-CoV-2 public health emergency.  Safety protocols were in place, including screening questions prior to the visit, additional usage of staff PPE, and extensive cleaning of exam room while observing appropriate contact time as indicated for disinfecting solutions.    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 Generalized anxiety disorder 06/06/2017  . 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 Cervical polyp 08/26/2014  . Urinary frequency 06/14/2014  . Genetic testing 05/27/2014  . Family history of uterine cancer   . Breast cancer screening, high risk patient 03/30/2014  . Allergic rhinitis 03/30/2014  . Migraine 03/30/2014  . History of depression 03/30/2014  . Family history of breast cancer 03/30/2014   Past Medical History:  Diagnosis Date  . Allergy   . Depression   . Family history of malignant neoplasm of breast   . Family history of uterine cancer   . Migraine   . UTI (lower urinary tract infection)    Past Surgical History:  Procedure Laterality Date  . BREAST BIOPSY  2011   benign  . BREAST EXCISIONAL BIOPSY Left   . TONSILLECTOMY AND ADENOIDECTOMY   1993   Social History   Tobacco Use  . Smoking status: Never Smoker  . Smokeless tobacco: Never Used  Vaping Use  . Vaping Use: Never used  Substance Use Topics  . Alcohol use: Yes    Alcohol/week: 4.0 standard drinks    Types: 4 Standard drinks or equivalent per week  . Drug use: No   Family History  Problem Relation Age of Onset  . Cancer Mother        uterine @ 3, breast @ 43, and melanoma @ 17  . Hypertension Mother   . Asthma Mother   . Arrhythmia Mother   . Hyperlipidemia Father   . Hypertension Father   . Diabetes Father   . Heart block Father        had angioplasty  . Cancer Maternal Grandfather        lung; smoker; deceased 38  . Cancer Paternal Grandmother 45       colon cancer; deceased 21  . Alcohol abuse Paternal Grandfather   . Cancer Maternal Aunt        skin & colon polyps (#/type?)  . Cancer Maternal Uncle        skin  . Cancer Paternal Aunt 6       breast; currently 79  . Migraines Other  paternal side  . Breast cancer Neg Hx    Allergies  Allergen Reactions  . Codone [Hydrocodone]   . Elemental Sulfur   . Penicillins    Current Outpatient Medications on File Prior to Visit  Medication Sig Dispense Refill  . albuterol (PROAIR HFA) 108 (90 Base) MCG/ACT inhaler Inhale 2 puffs into the lungs every 4 (four) hours as needed for wheezing or shortness of breath (cough). 1 Inhaler 1  . cetirizine (ZYRTEC) 10 MG tablet Take 10 mg by mouth daily.    . fluticasone (FLONASE) 50 MCG/ACT nasal spray SHAKE LIQUID AND USE 2 SPRAYS IN EACH NOSTRIL DAILY 16 g 2  . norethindrone-ethinyl estradiol (AUROVELA FE 1/20) 1-20 MG-MCG tablet Take 1 tablet by mouth daily. 84 tablet 4  . OnabotulinumtoxinA (BOTOX IJ) Inject as directed. Reported on 02/23/2015    . SUMAtriptan (IMITREX) 100 MG tablet Take 100 mg by mouth every 2 (two) hours as needed for migraine or headache. Reported on 02/23/2015     No current facility-administered medications on file prior to  visit.     Review of Systems  Constitutional: Negative for activity change, appetite change, fatigue, fever and unexpected weight change.  HENT: Negative for congestion, ear pain, rhinorrhea, sinus pressure and sore throat.   Eyes: Negative for pain, redness and visual disturbance.  Respiratory: Negative for cough, shortness of breath and wheezing.   Cardiovascular: Negative for chest pain and palpitations.  Gastrointestinal: Negative for abdominal pain, blood in stool, constipation and diarrhea.  Endocrine: Negative for polydipsia and polyuria.  Genitourinary: Negative for dysuria, frequency and urgency.  Musculoskeletal: Negative for arthralgias, back pain and myalgias.  Skin: Negative for pallor and rash.  Allergic/Immunologic: Negative for environmental allergies.  Neurological: Negative for dizziness, syncope and headaches.  Hematological: Negative for adenopathy. Does not bruise/bleed easily.  Psychiatric/Behavioral: Positive for sleep disturbance. Negative for decreased concentration, dysphoric mood and suicidal ideas. The patient is not nervous/anxious.        Objective:   Physical Exam Constitutional:      Appearance: Normal appearance. She is normal weight. She is not ill-appearing.  Eyes:     General: No scleral icterus.    Conjunctiva/sclera: Conjunctivae normal.     Pupils: Pupils are equal, round, and reactive to light.  Cardiovascular:     Rate and Rhythm: Normal rate and regular rhythm.     Heart sounds: Normal heart sounds.  Pulmonary:     Effort: Pulmonary effort is normal. No respiratory distress.     Breath sounds: Normal breath sounds. No wheezing.  Musculoskeletal:     Cervical back: Normal range of motion and neck supple.     Right lower leg: No edema.     Left lower leg: No edema.  Lymphadenopathy:     Cervical: No cervical adenopathy.  Skin:    General: Skin is warm and dry.     Coloration: Skin is not pale.     Findings: No erythema.   Neurological:     Mental Status: She is alert.     Cranial Nerves: No cranial nerve deficit.     Coordination: Coordination normal.     Deep Tendon Reflexes: Reflexes normal.     Comments: No tremor  Psychiatric:        Attention and Perception: Attention normal.        Mood and Affect: Mood normal.        Speech: Speech normal.        Cognition and Memory: Cognition  and memory normal.     Comments: Pleasant and talkative           Assessment & Plan:   Problem List Items Addressed This Visit      Cardiovascular and Mediastinum   Migraine    Doing fairly well [facility]      Relevant Medications   escitalopram (LEXAPRO) 20 MG tablet     Other   Generalized anxiety disorder - Primary    Stable mood with lexapro 20 mg daily and buspar 7.5 mg bid  Reviewed stressors/ coping techniques/symptoms/ support sources/ tx options and side effects in detail today Encouraged good self care  Headaches improved as well      Relevant Medications   busPIRone (BUSPAR) 15 MG tablet   escitalopram (LEXAPRO) 20 MG tablet

## 2020-06-11 NOTE — Assessment & Plan Note (Signed)
Stable mood with lexapro 20 mg daily and buspar 7.5 mg bid  Reviewed stressors/ coping techniques/symptoms/ support sources/ tx options and side effects in detail today Encouraged good self care  Headaches improved as well

## 2020-06-11 NOTE — Assessment & Plan Note (Signed)
Doing fairly well [facility]

## 2020-06-21 ENCOUNTER — Ambulatory Visit (INDEPENDENT_AMBULATORY_CARE_PROVIDER_SITE_OTHER): Payer: BC Managed Care – PPO | Admitting: Psychology

## 2020-06-21 DIAGNOSIS — F419 Anxiety disorder, unspecified: Secondary | ICD-10-CM

## 2020-06-26 ENCOUNTER — Other Ambulatory Visit: Payer: Self-pay | Admitting: Family Medicine

## 2020-07-05 ENCOUNTER — Ambulatory Visit: Payer: BC Managed Care – PPO | Admitting: Psychology

## 2020-07-18 ENCOUNTER — Ambulatory Visit (INDEPENDENT_AMBULATORY_CARE_PROVIDER_SITE_OTHER): Payer: BC Managed Care – PPO | Admitting: Psychology

## 2020-07-18 DIAGNOSIS — F419 Anxiety disorder, unspecified: Secondary | ICD-10-CM | POA: Diagnosis not present

## 2020-07-19 ENCOUNTER — Ambulatory Visit: Payer: BC Managed Care – PPO | Admitting: Psychology

## 2020-08-02 ENCOUNTER — Ambulatory Visit: Payer: BC Managed Care – PPO | Admitting: Psychology

## 2020-08-15 ENCOUNTER — Ambulatory Visit (INDEPENDENT_AMBULATORY_CARE_PROVIDER_SITE_OTHER): Payer: BC Managed Care – PPO | Admitting: Psychology

## 2020-08-15 DIAGNOSIS — F419 Anxiety disorder, unspecified: Secondary | ICD-10-CM | POA: Diagnosis not present

## 2020-08-16 ENCOUNTER — Ambulatory Visit: Payer: BC Managed Care – PPO | Admitting: Psychology

## 2020-08-29 ENCOUNTER — Ambulatory Visit (INDEPENDENT_AMBULATORY_CARE_PROVIDER_SITE_OTHER): Payer: BC Managed Care – PPO | Admitting: Psychology

## 2020-08-30 ENCOUNTER — Ambulatory Visit: Payer: BC Managed Care – PPO | Admitting: Psychology

## 2020-08-30 DIAGNOSIS — F419 Anxiety disorder, unspecified: Secondary | ICD-10-CM

## 2020-09-13 ENCOUNTER — Ambulatory Visit: Payer: BC Managed Care – PPO | Admitting: Psychology

## 2020-10-15 ENCOUNTER — Other Ambulatory Visit: Payer: Self-pay

## 2020-10-15 ENCOUNTER — Ambulatory Visit
Admission: RE | Admit: 2020-10-15 | Discharge: 2020-10-15 | Disposition: A | Discharge status: Home / Self Care | Payer: BC Managed Care – PPO | Source: Ambulatory Visit | Attending: Oncology | Admitting: Oncology

## 2020-10-15 DIAGNOSIS — Z1239 Encounter for other screening for malignant neoplasm of breast: Secondary | ICD-10-CM

## 2020-10-15 MED ORDER — GADOBUTROL 1 MMOL/ML IV SOLN
7.0000 mL | Freq: Once | INTRAVENOUS | Status: AC | PRN
  Administered 2020-10-15: 7 mL via INTRAVENOUS

## 2020-11-21 ENCOUNTER — Ambulatory Visit (HOSPITAL_BASED_OUTPATIENT_CLINIC_OR_DEPARTMENT_OTHER): Payer: BC Managed Care – PPO | Admitting: Obstetrics & Gynecology

## 2020-11-21 ENCOUNTER — Encounter (HOSPITAL_BASED_OUTPATIENT_CLINIC_OR_DEPARTMENT_OTHER): Payer: Self-pay

## 2020-11-21 ENCOUNTER — Telehealth: Payer: BC Managed Care – PPO | Admitting: Physician Assistant

## 2020-11-21 DIAGNOSIS — U071 COVID-19: Secondary | ICD-10-CM | POA: Diagnosis not present

## 2020-11-21 MED ORDER — ALBUTEROL SULFATE HFA 108 (90 BASE) MCG/ACT IN AERS: 2.0000 | INHALATION_SPRAY | Freq: Four times a day (QID) | RESPIRATORY_TRACT | 0 refills | Status: AC | PRN

## 2020-11-21 NOTE — Progress Notes (Signed)
Virtual Visit Consent   Kirsten Wilson, you are scheduled for a virtual visit with a [facility] provider today.     Just as with appointments in the office, your consent must be obtained to participate.  Your consent will be active for this visit and any virtual visit you may have with one of our providers in the next 365 days.     If you have a MyChart account, a copy of this consent can be sent to you electronically.  All virtual visits are billed to your insurance company just like a traditional visit in the office.    As this is a virtual visit, video technology does not allow for your provider to perform a traditional examination.  This may limit your provider's ability to fully assess your condition.  If your provider identifies any concerns that need to be evaluated in person or the need to arrange testing (such as labs, EKG, etc.), we will make arrangements to do so.     Although advances in technology are sophisticated, we cannot ensure that it will always work on either your end or our end.  If the connection with a video visit is poor, the visit may have to be switched to a telephone visit.  With either a video or telephone visit, we are not always able to ensure that we have a secure connection.     I need to obtain your verbal consent now.   Are you willing to proceed with your visit today?    [name] has provided verbal consent on 11/21/2020 for a virtual visit (video or telephone).   Kirsten Wilson, [place]   Date: 11/21/2020 5:56 PM   Virtual Visit via Video Note   I, Kirsten Wilson, connected with  Kaidynce Pfister  ([id number], [birth year]) on 11/21/20 at  6:15 PM EDT by a video-enabled telemedicine application and verified that I am speaking with the correct person using two identifiers.  Location: Patient: Virtual Visit Location Patient: Home Provider: Virtual Visit Location Provider: Home Office   I discussed the limitations of evaluation and  management by telemedicine and the availability of in person appointments. The patient expressed understanding and agreed to proceed.    History of Present Illness: Kirsten Wilson is a 40 y.o. who identifies as a female who was assigned female at birth, and is being seen today for COVID-19. Symptoms starting yesterday around midday with nasal/head congestion and PND. Took a home COVID test which was negative. Thought was allergies and treated as such but as of today around 1 she has had a borderline fever, headaches, chills. Home test taken again and was positive x 2. Denies chest pain or SOB. Notes some cough. Has taken Tylenol for headache, etc.   HPI: HPI  Problems:  Patient Active Problem List   Diagnosis Date Noted   Generalized anxiety disorder 06/06/2017   [place] uterus 09/03/2015   Cervical polyp 08/26/2014   Urinary frequency 06/14/2014   Genetic testing 05/27/2014   Family history of uterine cancer    Breast cancer screening, high risk patient 03/30/2014   Allergic rhinitis 03/30/2014   Migraine 03/30/2014   History of depression 03/30/2014   Family history of breast cancer 03/30/2014    Allergies:  Allergies  Allergen Reactions   Codone [Hydrocodone]    Elemental Sulfur    Penicillins    Medications:  Current Outpatient Medications:    albuterol (PROAIR HFA) 108 (90 Base) MCG/ACT inhaler, Inhale 2 puffs into the lungs  every 4 (four) hours as needed for wheezing or shortness of breath (cough)., Disp: 1 Inhaler, Rfl: 1   busPIRone (BUSPAR) 15 MG tablet, Take 0.5 tablets (7.5 mg total) by mouth 2 (two) times daily., Disp: 30 tablet, Rfl: 11   cetirizine (ZYRTEC) 10 MG tablet, Take 10 mg by mouth daily., Disp: , Rfl:    escitalopram (LEXAPRO) 20 MG tablet, Take 1 tablet (20 mg total) by mouth daily., Disp: 30 tablet, Rfl: 11   fluticasone (FLONASE) 50 MCG/ACT nasal spray, SHAKE LIQUID AND USE 2 SPRAYS IN EACH NOSTRIL DAILY, Disp: 16 g, Rfl: 2   norethindrone-ethinyl  estradiol (AUROVELA FE 1/20) 1-20 MG-MCG tablet, Take 1 tablet by mouth daily., Disp: 84 tablet, Rfl: 4   OnabotulinumtoxinA (BOTOX IJ), Inject as directed. Reported on 02/23/2015, Disp: , Rfl:    SUMAtriptan (IMITREX) 100 MG tablet, Take 100 mg by mouth every 2 (two) hours as needed for migraine or headache. Reported on 02/23/2015, Disp: , Rfl:   Observations/Objective: Patient is well-developed, well-nourished in no acute distress.  Resting comfortably at home.  Head is normocephalic, atraumatic.  No labored breathing. Speech is clear and coherent with logical content.  Patient is alert and oriented at baseline.   Assessment and Plan: 1. COVID-19 - MyChart COVID-19 home monitoring program; Future Milder symptoms. Lower risk of complications - risk score of 0. No indication for antiviral at present time as risk of ADR from medication > likely benefit. Supportive measures, OTC medications and Vitamin regimen reviewed. Albuterol per orders if needed. Patient enrolled in [place] monitoring program through [facility]. Strict ER precautions reviewed.  Follow Up Instructions: I discussed the assessment and treatment plan with the patient. The patient was provided an opportunity to ask questions and all were answered. The patient agreed with the plan and demonstrated an understanding of the instructions.  A copy of instructions were sent to the patient via MyChart.  The patient was advised to call back or seek an in-person evaluation if the symptoms worsen or if the condition fails to improve as anticipated.  Time:  I spent 12 minutes with the patient via telehealth technology discussing the above problems/concerns.    Kirsten Rio, PA-C

## 2020-11-21 NOTE — Patient Instructions (Signed)
Kirsten Wilson, thank you for joining [name], PA-C for today's virtual visit.  While this provider is not your primary care provider (PCP), if your PCP is located in our provider database this encounter information will be shared with them immediately following your visit.  Consent: (Patient) Kirsten Wilson provided verbal consent for this virtual visit at the beginning of the encounter.  Current Medications:  Current Outpatient Medications:    albuterol (PROAIR HFA) 108 (90 Base) MCG/ACT inhaler, Inhale 2 puffs into the lungs every 4 (four) hours as needed for wheezing or shortness of breath (cough)., Disp: 1 Inhaler, Rfl: 1   busPIRone (BUSPAR) 15 MG tablet, Take 0.5 tablets (7.5 mg total) by mouth 2 (two) times daily., Disp: 30 tablet, Rfl: 11   cetirizine (ZYRTEC) 10 MG tablet, Take 10 mg by mouth daily., Disp: , Rfl:    escitalopram (LEXAPRO) 20 MG tablet, Take 1 tablet (20 mg total) by mouth daily., Disp: 30 tablet, Rfl: 11   fluticasone (FLONASE) 50 MCG/ACT nasal spray, SHAKE LIQUID AND USE 2 SPRAYS IN EACH NOSTRIL DAILY, Disp: 16 g, Rfl: 2   norethindrone-ethinyl estradiol (AUROVELA FE 1/20) 1-20 MG-MCG tablet, Take 1 tablet by mouth daily., Disp: 84 tablet, Rfl: 4   OnabotulinumtoxinA (BOTOX IJ), Inject as directed. Reported on 02/23/2015, Disp: , Rfl:    SUMAtriptan (IMITREX) 100 MG tablet, Take 100 mg by mouth every 2 (two) hours as needed for migraine or headache. Reported on 02/23/2015, Disp: , Rfl:    Medications ordered in this encounter:  No orders of the defined types were placed in this encounter.    *If you need refills on other medications prior to your next appointment, please contact your pharmacy*  Follow-Up: Call back or seek an in-person evaluation if the symptoms worsen or if the condition fails to improve as anticipated.  Other Instructions Please keep well-hydrated and get plenty of rest. Start a saline nasal rinse to flush out your nasal  passages. You can use plain Mucinex to help thin congestion. If you have a humidifier, running in the bedroom at night. I want you to start OTC vitamin D3 1000 units daily, vitamin C 1000 mg daily, and a zinc supplement. Please take prescribed medications as directed.  You have been enrolled in a MyChart symptom monitoring program. Please answer these questions daily so we can keep track of how you are doing.  You were to quarantine for 5 days from onset of your symptoms.  After day 5, if you have had no fever and you are feeling better, you can end quarantine but need to mask for an additional 5 days. After day 5 if you have a fever or are having significant symptoms, please quarantine for full 10 days.  If you note any worsening of symptoms, any significant shortness of breath or any chest pain, please seek ER evaluation ASAP.  Please do not delay care!  COVID-19: What to Do if You Are Sick If you test positive and are an older adult or someone who is at high risk of getting very sick from COVID-19, treatment may be available. Contact a healthcare provider right away after a positive test to determine if you are eligible, even if your symptoms are mild right now. You can also visit a Test to Treat location and, if eligible, receive a prescription from a provider. Don't delay: Treatment must be started within the first few days to be effective. If you have a fever, cough, or other symptoms, you  might have COVID-19. Most people have mild illness and are able to recover at home. If you are sick: Keep track of your symptoms. If you have an emergency warning sign (including trouble breathing), call 911. Steps to help prevent the spread of COVID-19 if you are sick If you are sick with COVID-19 or think you might have COVID-19, follow the steps below to care for yourself and to help protect other people in your home and community. Stay home except to get medical care Stay home. Most people with  COVID-19 have mild illness and can recover at home without medical care. Do not leave your home, except to get medical care. Do not visit public areas and do not go to places where you are unable to wear a mask. Take care of yourself. Get rest and stay hydrated. Take over-the-counter medicines, such as acetaminophen, to help you feel better. Stay in touch with your doctor. Call before you get medical care. Be sure to get care if you have trouble breathing, or have any other emergency warning signs, or if you think it is an emergency. Avoid public transportation, ride-sharing, or taxis if possible. Get tested If you have symptoms of COVID-19, get tested. While waiting for test results, stay away from others, including staying apart from those living in your household. Get tested as soon as possible after your symptoms start. Treatments may be available for people with COVID-19 who are at risk for becoming very sick. Don't delay: Treatment must be started early to be effective--some treatments must begin within 5 days of your first symptoms. Contact your healthcare provider right away if your test result is positive to determine if you are eligible. Self-tests are one of several options for testing for the virus that causes COVID-19 and may be more convenient than laboratory-based tests and point-of-care tests. Ask your healthcare provider or your local health department if you need help interpreting your test results. You can visit your state, tribal, local, and territorial health department's website to look for the latest local information on testing sites. Separate yourself from other people As much as possible, stay in a specific room and away from other people and pets in your home. If possible, you should use a separate bathroom. If you need to be around other people or animals in or outside of the home, wear a well-fitting mask. Tell your close contacts that they may have been exposed to COVID-19.  An infected person can spread COVID-19 starting 48 hours (or 2 days) before the person has any symptoms or tests positive. By letting your close contacts know they may have been exposed to COVID-19, you are helping to protect everyone. See COVID-19 and Animals if you have questions about pets. If you are diagnosed with COVID-19, someone from the health department may call you. Answer the call to slow the spread. Monitor your symptoms Symptoms of COVID-19 include fever, cough, or other symptoms. Follow care instructions from your healthcare provider and local health department. Your local health authorities may give instructions on checking your symptoms and reporting information. When to seek emergency medical attention Look for emergency warning signs* for COVID-19. If someone is showing any of these signs, seek emergency medical care immediately: Trouble breathing Persistent pain or pressure in the chest New confusion Inability to wake or stay awake Pale, gray, or blue-colored skin, lips, or nail beds, depending on skin tone *This list is not all possible symptoms. Please call your medical provider for any other symptoms that  are severe or concerning to you. Call 911 or call ahead to your local emergency facility: Notify the operator that you are seeking care for someone who has or may have COVID-19. Call ahead before visiting your doctor Call ahead. Many medical visits for routine care are being postponed or done by phone or telemedicine. If you have a medical appointment that cannot be postponed, call your doctor's office, and tell them you have or may have COVID-19. This will help the office protect themselves and other patients. If you are sick, wear a well-fitting mask You should wear a mask if you must be around other people or animals, including pets (even at home). Wear a mask with the best fit, protection, and comfort for you. You don't need to wear the mask if you are alone. If you  can't put on a mask (because of trouble breathing, for example), cover your coughs and sneezes in some other way. Try to stay at least 6 feet away from other people. This will help protect the people around you. Masks should not be placed on young children under age 76 years, anyone who has trouble breathing, or anyone who is not able to remove the mask without help. Cover your coughs and sneezes Cover your mouth and nose with a tissue when you cough or sneeze. Throw away used tissues in a lined trash can. Immediately wash your hands with soap and water for at least 20 seconds. If soap and water are not available, clean your hands with an alcohol-based hand sanitizer that contains at least 60% alcohol. Clean your hands often Wash your hands often with soap and water for at least 20 seconds. This is especially important after blowing your nose, coughing, or sneezing; going to the bathroom; and before eating or preparing food. Use hand sanitizer if soap and water are not available. Use an alcohol-based hand sanitizer with at least 60% alcohol, covering all surfaces of your hands and rubbing them together until they feel dry. Soap and water are the best option, especially if hands are visibly dirty. Avoid touching your eyes, nose, and mouth with unwashed hands. Handwashing Tips Avoid sharing personal household items Do not share dishes, drinking glasses, cups, eating utensils, towels, or bedding with other people in your home. Wash these items thoroughly after using them with soap and water or put in the dishwasher. Clean surfaces in your home regularly Clean and disinfect high-touch surfaces (for example, doorknobs, tables, handles, light switches, and countertops) in your "sick room" and bathroom. In shared spaces, you should clean and disinfect surfaces and items after each use by the person who is ill. If you are sick and cannot clean, a caregiver or other person should only clean and disinfect the  area around you (such as your bedroom and bathroom) on an as needed basis. Your caregiver/other person should wait as long as possible (at least several hours) and wear a mask before entering, cleaning, and disinfecting shared spaces that you use. Clean and disinfect areas that may have blood, stool, or body fluids on them. Use household cleaners and disinfectants. Clean visible dirty surfaces with household cleaners containing soap or detergent. Then, use a household disinfectant. Use a product from [employer] List N: Disinfectants for Coronavirus (TIRWE-31). Be sure to follow the instructions on the label to ensure safe and effective use of the product. Many products recommend keeping the surface wet with a disinfectant for a certain period of time (look at "contact time" on the product label).  You may also need to wear personal protective equipment, such as gloves, depending on the directions on the product label. Immediately after disinfecting, wash your hands with soap and water for 20 seconds. For completed guidance on cleaning and disinfecting your home, visit Complete Disinfection Guidance. Take steps to improve ventilation at home Improve ventilation (air flow) at home to help prevent from spreading COVID-19 to other people in your household. Clear out COVID-19 virus particles in the air by opening windows, using air filters, and turning on fans in your home. Use this interactive tool to learn how to improve air flow in your home. When you can be around others after being sick with COVID-19 Deciding when you can be around others is different for different situations. Find out when you can safely end home isolation. For any additional questions about your care, contact your healthcare provider or state or local health department. 05/23/2020 Content source: [facility] ([facility]), Division of Viral Diseases This information is not intended to replace  advice given to you by your health care provider. Make sure you discuss any questions you have with your health care provider. Document Revised: 07/06/2020 Document Reviewed: 07/06/2020 Elsevier Patient Education  2022 [employer].   If you have been instructed to have an in-person evaluation today at a local Urgent Care facility, please use the link below. It will take you to a list of all of our available [facility], including address, phone number and hours of operation. Please do not delay care.  [facility]  If you or a family member do not have a primary care provider, use the link below to schedule a visit and establish care. When you choose a [facility] primary care physician or advanced practice provider, you gain a long-term partner in health. Find a Primary Care Provider  Learn more about [facility]'s in-office and virtual care options: [facility]

## 2020-11-22 ENCOUNTER — Telehealth: Payer: Self-pay | Admitting: *Deleted

## 2020-11-22 ENCOUNTER — Encounter (INDEPENDENT_AMBULATORY_CARE_PROVIDER_SITE_OTHER): Payer: Self-pay

## 2020-11-22 ENCOUNTER — Ambulatory Visit (HOSPITAL_BASED_OUTPATIENT_CLINIC_OR_DEPARTMENT_OTHER): Payer: BC Managed Care – PPO | Admitting: Obstetrics & Gynecology

## 2020-11-22 NOTE — Telephone Encounter (Signed)
Addressed in telephone encounter.

## 2020-11-22 NOTE — Telephone Encounter (Signed)
BPA triggered for worsening weakness. Pt reports generalized weakness, increased fatigue. Able to get up and about independently. Advised to ensure she is staying hydrated, resting, good nutrition. Advised if continues or worsens call PCP. If requires assistance to get up, has to hold onto things to get around, go to ED. Pt verbalizes understanding.

## 2020-11-26 ENCOUNTER — Telehealth: Payer: Self-pay | Admitting: *Deleted

## 2020-11-26 NOTE — Telephone Encounter (Signed)
Called patient to review covid symptom responses. Patient reports cough is the same , weakness is the same and appetite is the same. Reviewed recommendations for treatment and when to notify PCP or go to UC or ED with patient. Review signs and symptoms of dehydration , increased thirst, decrease urine output and headache, dizziness and weakness, dry mouth. Patient verbalized understanding of care advise . Sent My Chart response  11/26/20 at 12:15 pm.to patient for treatment suggestions and when to notify PCP or seek help in ED or call 911.

## 2020-11-27 ENCOUNTER — Encounter: Payer: Self-pay | Admitting: Family Medicine

## 2020-12-13 ENCOUNTER — Ambulatory Visit (INDEPENDENT_AMBULATORY_CARE_PROVIDER_SITE_OTHER): Payer: BC Managed Care – PPO | Admitting: Obstetrics & Gynecology

## 2020-12-13 ENCOUNTER — Other Ambulatory Visit (HOSPITAL_COMMUNITY)
Admission: RE | Admit: 2020-12-13 | Discharge: 2020-12-13 | Disposition: A | Discharge status: Home / Self Care | Payer: BC Managed Care – PPO | Source: Ambulatory Visit | Attending: Obstetrics & Gynecology | Admitting: Obstetrics & Gynecology

## 2020-12-13 ENCOUNTER — Encounter (HOSPITAL_BASED_OUTPATIENT_CLINIC_OR_DEPARTMENT_OTHER): Payer: Self-pay | Admitting: Obstetrics & Gynecology

## 2020-12-13 ENCOUNTER — Other Ambulatory Visit: Payer: Self-pay

## 2020-12-13 VITALS — BP 134/75 | HR 66 | Ht 62.0 in | Wt 158.0 lb

## 2020-12-13 DIAGNOSIS — Z01419 Encounter for gynecological examination (general) (routine) without abnormal findings: Secondary | ICD-10-CM

## 2020-12-13 DIAGNOSIS — G43009 Migraine without aura, not intractable, without status migrainosus: Secondary | ICD-10-CM

## 2020-12-13 DIAGNOSIS — Q51810 Arcuate uterus: Secondary | ICD-10-CM

## 2020-12-13 DIAGNOSIS — Z803 Family history of malignant neoplasm of breast: Secondary | ICD-10-CM

## 2020-12-13 DIAGNOSIS — Z23 Encounter for immunization: Secondary | ICD-10-CM | POA: Diagnosis not present

## 2020-12-13 DIAGNOSIS — Z124 Encounter for screening for malignant neoplasm of cervix: Secondary | ICD-10-CM | POA: Diagnosis not present

## 2020-12-13 DIAGNOSIS — Z8049 Family history of malignant neoplasm of other genital organs: Secondary | ICD-10-CM

## 2020-12-13 MED ORDER — NORETHIN ACE-ETH ESTRAD-FE 1-20 MG-MCG PO TABS: 1.0000 | ORAL_TABLET | Freq: Every day | ORAL | 4 refills | Status: DC

## 2020-12-13 NOTE — Progress Notes (Signed)
40 y.o. G0P0000 Single White or Caucasian female here for annual exam.  Doing well.  Cycles are normal.  Cycles last 3-4 days and are light.  She is almost done with her master's degree.  Will graduate in December.  She is applying for jobs now.  Patient's last menstrual period was 11/20/2020.          Sexually active: No.  The current method of family planning is OCP (estrogen/progesterone).    Exercising: No.   Smoker:  no  Health Maintenance: Pap:  11/09/2019 Negative MMG:  10/15/2020 Negative Screening Labs: does CBC and CMP with Dr. Jana Hakim and I did additional screening blood work in 2020.     reports that she has never smoked. She has never used smokeless tobacco. She reports current alcohol use of about 4.0 standard drinks per week. She reports that she does not use drugs.  Past Medical History:  Diagnosis Date   Allergy    Depression    Family history of malignant neoplasm of breast    Family history of uterine cancer    Migraine    UTI (lower urinary tract infection)     Past Surgical History:  Procedure Laterality Date   BREAST BIOPSY  2011   benign   BREAST EXCISIONAL BIOPSY Left    TONSILLECTOMY AND ADENOIDECTOMY  1993    Current Outpatient Medications  Medication Sig Dispense Refill   albuterol (VENTOLIN HFA) 108 (90 Base) MCG/ACT inhaler Inhale 2 puffs into the lungs every 6 (six) hours as needed for wheezing or shortness of breath. 8 g 0   busPIRone (BUSPAR) 15 MG tablet Take 0.5 tablets (7.5 mg total) by mouth 2 (two) times daily. 30 tablet 11   cetirizine (ZYRTEC) 10 MG tablet Take 10 mg by mouth daily.     escitalopram (LEXAPRO) 20 MG tablet Take 1 tablet (20 mg total) by mouth daily. 30 tablet 11   fluticasone (FLONASE) 50 MCG/ACT nasal spray SHAKE LIQUID AND USE 2 SPRAYS IN EACH NOSTRIL DAILY 16 g 2   norethindrone-ethinyl estradiol (AUROVELA FE 1/20) 1-20 MG-MCG tablet Take 1 tablet by mouth daily. 84 tablet 4   OnabotulinumtoxinA (BOTOX IJ) Inject as  directed. Reported on 02/23/2015     SUMAtriptan (IMITREX) 100 MG tablet Take 100 mg by mouth every 2 (two) hours as needed for migraine or headache. Reported on 02/23/2015     No current facility-administered medications for this visit.    Family History  Problem Relation Age of Onset   Cancer Mother        uterine @ 84, breast @ 76, and melanoma @ 76   Hypertension Mother    Asthma Mother    Arrhythmia Mother    Hyperlipidemia Father    Hypertension Father    Diabetes Father    Heart block Father        had angioplasty   Cancer Maternal Grandfather        lung; smoker; deceased 57   Cancer Paternal Grandmother 18       colon cancer; deceased 34   Alcohol abuse Paternal Grandfather    Cancer Maternal Aunt        skin & colon polyps (#/type?)   Cancer Maternal Uncle        skin   Cancer Paternal Aunt 61       breast; currently 51   Migraines Other        paternal side   Breast cancer Neg Hx  Review of Systems  All other systems reviewed and are negative.  Exam:   BP 134/75 (BP Location: Right Arm, Patient Position: Sitting, Cuff Size: Normal)   Pulse 66   Ht 5\' 2"  (1.575 m) Comment: reported  Wt 158 lb (71.7 kg)   LMP 11/20/2020   BMI 28.90 kg/m   Height: 5\' 2"  (157.5 cm) (reported)  General appearance: alert, cooperative and appears stated age Head: Normocephalic, without obvious abnormality, atraumatic Neck: no adenopathy, supple, symmetrical, trachea midline and thyroid normal to inspection and palpation Lungs: clear to auscultation bilaterally Breasts: normal appearance, no masses or tenderness Heart: regular rate and rhythm Abdomen: soft, non-tender; bowel sounds normal; no masses,  no organomegaly Extremities: extremities normal, atraumatic, no cyanosis or edema Skin: Skin color, texture, turgor normal. No rashes or lesions Lymph nodes: Cervical, supraclavicular, and axillary nodes normal. No abnormal inguinal nodes palpated Neurologic: Grossly  normal   Pelvic: External genitalia:  no lesions              Urethra:  normal appearing urethra with no masses, tenderness or lesions              Bartholins and Skenes: normal                 Vagina: normal appearing vagina with normal color and no discharge, no lesions              Cervix: no lesions              Pap taken: Yes.   Bimanual Exam:  Uterus:  normal size, contour, position, consistency, mobility, non-tender              Adnexa: normal adnexa and no mass, fullness, tenderness               Rectovaginal: Confirms               Anus:  normal sphincter tone, no lesions  Chaperone, Octaviano Batty, CMA, was present for exam.  Assessment/Plan: 1. Well woman exam with routine gynecological exam - pap only obtained today (obtained yearly due to family hx of endometrial cancer) - has MMG and breast MRI on 6 month alternating schedule - colon cancer screening reviewed - care gaps reviewed/upedated - Flu Vaccine QUAD 36+ mos IM (Fluarix, Quad PF)  2. Family history of uterine cancer (mother diagnosed age 82) - US PELVIS TRANSVAGINAL NON-OB (TV ONLY); Future - norethindrone-ethinyl estradiol-FE (AUROVELA FE 1/20) 1-20 MG-MCG tablet; Take 1 tablet by mouth daily.  Dispense: 84 tablet; Refill: 4  3. Family history of breast cancer - followed by Dr. Jana Hakim - had genetic testing and then expanded genetic testing done.  All negative  4. Arcuate uterus  5. Migraine without aura and without status migrainosus, not intractable - having trigger point injections/botox with Dr. Domingo Cocking and this has helped a lot

## 2020-12-15 LAB — CYTOLOGY - PAP: Diagnosis: NEGATIVE

## 2020-12-20 ENCOUNTER — Ambulatory Visit (INDEPENDENT_AMBULATORY_CARE_PROVIDER_SITE_OTHER): Payer: BC Managed Care – PPO

## 2020-12-20 ENCOUNTER — Other Ambulatory Visit: Payer: Self-pay

## 2020-12-20 DIAGNOSIS — Z8049 Family history of malignant neoplasm of other genital organs: Secondary | ICD-10-CM

## 2020-12-20 DIAGNOSIS — Q51810 Arcuate uterus: Secondary | ICD-10-CM | POA: Diagnosis not present

## 2021-01-12 ENCOUNTER — Other Ambulatory Visit: Payer: Self-pay | Admitting: *Deleted

## 2021-01-12 DIAGNOSIS — Z1239 Encounter for other screening for malignant neoplasm of breast: Secondary | ICD-10-CM

## 2021-01-15 ENCOUNTER — Other Ambulatory Visit: Payer: Self-pay

## 2021-01-15 ENCOUNTER — Inpatient Hospital Stay: Payer: BC Managed Care – PPO

## 2021-01-15 ENCOUNTER — Inpatient Hospital Stay: Payer: BC Managed Care – PPO | Attending: Oncology | Admitting: Oncology

## 2021-01-15 VITALS — BP 125/76 | HR 70 | Temp 98.1°F | Resp 16 | Ht 62.0 in | Wt 160.1 lb

## 2021-01-15 DIAGNOSIS — Z1239 Encounter for other screening for malignant neoplasm of breast: Secondary | ICD-10-CM

## 2021-01-15 DIAGNOSIS — Z808 Family history of malignant neoplasm of other organs or systems: Secondary | ICD-10-CM | POA: Insufficient documentation

## 2021-01-15 DIAGNOSIS — Z803 Family history of malignant neoplasm of breast: Secondary | ICD-10-CM | POA: Diagnosis not present

## 2021-01-15 LAB — CBC WITH DIFFERENTIAL (CANCER CENTER ONLY)
Abs Immature Granulocytes: 0.02 10*3/uL (ref 0.00–0.07)
Basophils Absolute: 0 10*3/uL (ref 0.0–0.1)
Basophils Relative: 0 %
Eosinophils Absolute: 0.1 10*3/uL (ref 0.0–0.5)
Eosinophils Relative: 1 %
HCT: 39.2 % (ref 36.0–46.0)
Hemoglobin: 13.2 g/dL (ref 12.0–15.0)
Immature Granulocytes: 0 %
Lymphocytes Relative: 35 %
Lymphs Abs: 2.4 10*3/uL (ref 0.7–4.0)
MCH: 29.7 pg (ref 26.0–34.0)
MCHC: 33.7 g/dL (ref 30.0–36.0)
MCV: 88.1 fL (ref 80.0–100.0)
Monocytes Absolute: 0.5 10*3/uL (ref 0.1–1.0)
Monocytes Relative: 7 %
Neutro Abs: 3.9 10*3/uL (ref 1.7–7.7)
Neutrophils Relative %: 57 %
Platelet Count: 206 10*3/uL (ref 150–400)
RBC: 4.45 MIL/uL (ref 3.87–5.11)
RDW: 12.4 % (ref 11.5–15.5)
WBC Count: 6.8 10*3/uL (ref 4.0–10.5)
nRBC: 0 % (ref 0.0–0.2)

## 2021-01-15 LAB — CMP (CANCER CENTER ONLY)
ALT: 14 U/L (ref 0–44)
AST: 18 U/L (ref 15–41)
Albumin: 3.9 g/dL (ref 3.5–5.0)
Alkaline Phosphatase: 43 U/L (ref 38–126)
Anion gap: 10 (ref 5–15)
BUN: 10 mg/dL (ref 6–20)
CO2: 23 mmol/L (ref 22–32)
Calcium: 8.6 mg/dL — ABNORMAL LOW (ref 8.9–10.3)
Chloride: 107 mmol/L (ref 98–111)
Creatinine: 0.76 mg/dL (ref 0.44–1.00)
GFR, Estimated: >60 mL/min (ref >60)
Glucose, Bld: 90 mg/dL (ref 70–99)
Potassium: 4.2 mmol/L (ref 3.5–5.1)
Sodium: 140 mmol/L (ref 135–145)
Total Bilirubin: 0.2 mg/dL — ABNORMAL LOW (ref 0.3–1.2)
Total Protein: 6.5 g/dL (ref 6.5–8.1)

## 2021-01-15 NOTE — Progress Notes (Signed)
[facility]  Telephone:([phone]) [phone] Fax:([phone]     ID: Kirsten Wilson DOB: 1980/11/15  MR#: [id number]  JYN#:[id number]  Patient Care Team: Abner Greenspan, MD as PCP - General (Family Medicine) Dace Denn, Virgie Dad, MD as Consulting Physician (Oncology) Megan Salon, MD as Consulting Physician (Gynecology) Laurence Ferrari, [place], MD as Consulting Physician (Dermatology) OTHER MD:   CHIEF COMPLAINT: high risk family history for breast, uterine and skin cancer  CURRENT TREATMENT: Intensified screening   INTERVAL HISTORY: Kirsten Wilson returns today for follow-up of her intensified screening program given her high risk of breast cancer.   Since her last visit, she underwent bilateral screening mammography with tomography at [facility] on 06/06/2020 showing: breast density category C; no evidence of malignancy in either breast.  She also underwent breast MRI on 10/15/2020 showing: breast composition D; no evidence for malignancy.  She is also followed by Dr. Sabra Heck for her family history of uterine cancer. Her most recent pelvic ultrasound was performed on 12/20/2020.  Results were unremarkable.  These are performed annually.   REVIEW OF SYSTEMS: Kirsten Wilson is not exercising regularly but she tells me in the course of the days work she walks between 11 and 15,000 steps.  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  A detailed review of systems is otherwise stable.   COVID 19 VACCINATION STATUS: s/p [place] x3, most recently 01/2020; infection 11/2020   HISTORY OF PRESENT ILLNESS: From the original intake note:  Kirsten Wilson moved to this in the area July 2015, from [place]. She had previously lived here. In [place] she was followed by oncology, dermatology, and gynecology for a family history strongly suggestive of a genetic mutation putting her at increased  risk for melanoma, endometrial and breast cancers (her mother had all three cancers at an early age). She did meet with genetic counseling there and on 05/07/2010 was tested for the BRCA1 and BRCA2 genes, with no mutation detected. A broader panel or BART testing were not performed.  She now presents to our high Risk clinic for futher evaluation and follow-up   PAST MEDICAL HISTORY: Past Medical History:  Diagnosis Date   Allergy    Depression    Family history of malignant neoplasm of breast    Family history of uterine cancer    Migraine    UTI (lower urinary tract infection)     PAST SURGICAL HISTORY: Past Surgical History:  Procedure Laterality Date   BREAST BIOPSY  2011   benign   BREAST EXCISIONAL BIOPSY Left    TONSILLECTOMY AND ADENOIDECTOMY  1993    FAMILY HISTORY Family History  Problem Relation Age of Onset   Cancer Mother        uterine @ 21, breast @ 62, and melanoma @ 68   Hypertension Mother    Asthma Mother    Arrhythmia Mother    Hyperlipidemia Father    Hypertension Father    Diabetes Father    Heart block Father        had angioplasty   Cancer Maternal Grandfather        lung; smoker; deceased 20   Cancer Paternal Grandmother 65       colon cancer; deceased 9   Alcohol abuse Paternal Grandfather    Cancer Maternal Aunt        skin & colon polyps (#/type?)   Cancer Maternal Uncle  skin   Cancer Paternal Aunt 75       breast; currently 76   Migraines Other        paternal side   Breast cancer Neg Hx    the patient's mother was diagnosed with cancer of the uterus at age 20, cancer of the breast at age 67, and melanoma at age 58. This may well represent a p53 mutation, but there are many other possibilities. Unfortunately the patient's mother, who is still living, refuses to be genetically tested. The patient's maternal grandfather had a history of brain and lung cancer (these apparently were 2 separate primaries). He was a smoker. He died at  age 37. 87 of the patient's mother's siblings had skin cancers, but not melanomas. On the paternal side the paternal grandmother was diagnosed with colon cancer at age 44 and a paternal aunt was diagnosed with breast cancer at age 75. The patient's father has a history of precancerous polyps.   GYNECOLOGIC HISTORY:  No LMP recorded. Menarche age 8. The patient is GX P0. She understands that waiting after age 51 to carry a child essentially doubles the risk of breast cancer.   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 She is single, lives alone, with no pets    ADVANCED DIRECTIVES: Not in place. At her 04/26/2014 visit she was given the appropriate documents to complete and notarize at her discretion.   HEALTH MAINTENANCE: Social History   Tobacco Use   Smoking status: Never   Smokeless tobacco: Never  Vaping Use   Vaping Use: Never used  Substance Use Topics   Alcohol use: Yes    Alcohol/week: 4.0 standard drinks    Types: 4 Standard drinks or equivalent per week   Drug use: No     Colonoscopy: Never  PAP: March 2015  Bone density: Never  Lipid panel:  Allergies  Allergen Reactions   Codone [Hydrocodone]    Elemental Sulfur    Penicillins     Current Outpatient Medications  Medication Sig Dispense Refill   albuterol (VENTOLIN HFA) 108 (90 Base) MCG/ACT inhaler Inhale 2 puffs into the lungs every 6 (six) hours as needed for wheezing or shortness of breath. 8 g 0   busPIRone (BUSPAR) 15 MG tablet Take 0.5 tablets (7.5 mg total) by mouth 2 (two) times daily. 30 tablet 11   cetirizine (ZYRTEC) 10 MG tablet Take 10 mg by mouth daily.     escitalopram (LEXAPRO) 20 MG tablet Take 1 tablet (20 mg total) by mouth daily. 30 tablet 11    fluticasone (FLONASE) 50 MCG/ACT nasal spray SHAKE LIQUID AND USE 2 SPRAYS IN EACH NOSTRIL DAILY 16 g 2   norethindrone-ethinyl estradiol-FE (AUROVELA FE 1/20) 1-20 MG-MCG tablet Take 1 tablet by mouth daily. 84 tablet 4   OnabotulinumtoxinA (BOTOX IJ) Inject as directed. Reported on 02/23/2015     SUMAtriptan (IMITREX) 100 MG tablet Take 100 mg by mouth every 2 (two) hours as needed for migraine or headache. Reported on 02/23/2015     No current facility-administered medications for this visit.    OBJECTIVE: White woman in no acute distress  Vitals:   01/15/21 1534  BP: 125/76  Pulse: 70  Resp: 16  Temp: 98.1 F (36.7 C)  SpO2: 100%  Body mass index is 29.28 kg/m.    ECOG FS:0 - Asymptomatic  Sclerae unicteric, EOMs intact Wearing a mask No cervical or supraclavicular adenopathy Lungs no rales or rhonchi Heart regular rate and rhythm Abd soft, nontender, positive bowel sounds MSK no focal spinal tenderness, no upper extremity lymphedema Neuro: nonfocal, well oriented, appropriate affect Breasts: There is a palpable mass in the upper outer quadrant of the right breast which appears unchanged from baseline.  There are no skin or nipple changes of concern.  Both axillae are benign2  LAB RESULTS:  CMP     Component Value Date/Time   NA 139 01/13/2020 1439   NA 144 08/31/2018 1347   NA 140 01/02/2017 1137   K 3.8 01/13/2020 1439   K 4.0 01/02/2017 1137   CL 105 01/13/2020 1439   CO2 28 01/13/2020 1439   CO2 26 01/02/2017 1137   GLUCOSE 104 (H) 01/13/2020 1439   GLUCOSE 94 01/02/2017 1137   BUN 9 01/13/2020 1439   BUN 8 08/31/2018 1347   BUN 10.8 01/02/2017 1137   CREATININE 0.76 01/13/2020 1439   CREATININE 0.8 01/02/2017 1137   CALCIUM 8.7 (L) 01/13/2020 1439   CALCIUM 9.2 01/02/2017 1137   PROT 6.7 01/13/2020 1439   PROT 6.2 08/31/2018 1347   PROT 6.6 01/02/2017 1137   ALBUMIN 3.9 01/13/2020 1439   ALBUMIN 4.3 08/31/2018 1347   ALBUMIN 3.9 01/02/2017 1137    AST 19 01/13/2020 1439   AST 16 01/02/2017 1137   ALT 14 01/13/2020 1439   ALT 10 01/02/2017 1137   ALKPHOS 44 01/13/2020 1439   ALKPHOS 50 01/02/2017 1137   BILITOT 0.4 01/13/2020 1439   BILITOT 0.40 01/02/2017 1137   GFRNONAA >60 01/13/2020 1439   GFRAA 123 08/31/2018 1347   GFRAA >60 01/06/2018 1457    INo results found for: SPEP, UPEP  Lab Results  Component Value Date   WBC 6.8 01/15/2021   NEUTROABS 3.9 01/15/2021   HGB 13.2 01/15/2021   HCT 39.2 01/15/2021   MCV 88.1 01/15/2021   PLT 206 01/15/2021      Chemistry      Component Value Date/Time   NA 139 01/13/2020 1439   NA 144 08/31/2018 1347   NA 140 01/02/2017 1137   K 3.8 01/13/2020 1439   K 4.0 01/02/2017 1137   CL 105 01/13/2020 1439   CO2 28 01/13/2020 1439   CO2 26 01/02/2017 1137   BUN 9 01/13/2020 1439   BUN 8 08/31/2018 1347   BUN 10.8 01/02/2017 1137   CREATININE 0.76 01/13/2020 1439   CREATININE 0.8 01/02/2017 1137      Component Value Date/Time   CALCIUM 8.7 (L) 01/13/2020 1439   CALCIUM 9.2 01/02/2017 1137   ALKPHOS 44 01/13/2020 1439   ALKPHOS 50 01/02/2017 1137   AST 19 01/13/2020 1439   AST 16 01/02/2017 1137   ALT 14 01/13/2020 1439   ALT 10 01/02/2017 1137   BILITOT 0.4 01/13/2020 1439   BILITOT 0.40 01/02/2017 1137       No results found for: LABCA2  No components found for: LABCA125  No results for input(s): INR in the last 168 hours.  Urinalysis    Component Value Date/Time   BILIRUBINUR negative 06/14/2014 1630   PROTEINUR negative 06/14/2014 1630   UROBILINOGEN 0.2 06/14/2014 1630   NITRITE negative 06/14/2014 1630   LEUKOCYTESUR large (3+) 06/14/2014 1630    STUDIES: US PELVIS TRANSVAGINAL NON-OB (TV ONLY)  Result Date: 12/25/2020 CLINICAL  DATA:  Family history of uterine cancer  EXAM: TRANSVAGINAL ULTRASOUND OF PELVIS  TECHNIQUE: Transvaginal ultrasound examination of the pelvis was performed. Transabdominal imaging of ovaries required to best visualize  the ovaries.   FINDINGS: Uterus: 6.0 x 2.8 x 4.9cm.  Volume: 45.49m  Endometrial thickness:   1-2 mm with small amount of fluid  Right ovary:  1.9 x 2.6 x 2.1cm.  Volume:  5.854m Left ovary:  3.5 x 2.3 x 2.5cm.  Volume:  15.32m732mOther findings:  No abnormal free fluid.     ASSESSMENT: [age]o. [place] woman with a high-risk family history for breast, uterine, melanoma and colon cancers; also with D density breasts  (a) biopsy of a mass in the upper outer right breast 09/30/2018 showed fibrocystic change with no evidence of malignancy.  (1) genetics testing 05/27/2014 through the BRCA 1 and 2 testing 05/07/2010 negative (BARTs not sent)  (2) genetics testing 05/05/2014 [facility] deleterious mutations in ATM, BARD1, BRCA1, BRCA2, BRIP1, CDH1, CHEK2, MRE11A, MUTYH, NBN, NF1, PALB2, PTEN, RAD50, RAD51C, RAD51D, and TP53  (3) intensified screening: Initiated June 2015  (a) yearly gynecology visits/ MilSabra Heckb) yearly mammography/yearly MRI, 6 months apart  (c) yearly dermatology screening/ [place]  ([facility]   PLAN:  Kirsten Wilson with intensified screening.  The most recent MRI does not mention the previously noted area of induration in the right breast although this is noticeable on the images.  Likely there has been no change but I have asked the radiologist to please confirm.  Otherwise the plan is to continue with mammography in April and breast MRI in October.  Kirsten Wilson behind on skin screening and although I saw her no suspicious lesions on her chest or back today I think fluoroscopy screening exam by dermatologist once a year is a good idea for her.  She is of course continuing gynecology screening through Dr. MilSabra HeckShe tells me her eye doctor has scheduled her for every other year ophthalmology exam  I congratulated her on completing her training and anticipates she will have little  difficulty obtaining a position to her liking.  She will see us [place] in approximately 1 year.  She knows to call for any other problems that may develop before then  Total encounter time 20 minutes.*   Delshon Blanchfield, GusVirgie DadD  01/15/21 3:38 PM Medical Oncology and Hematology [facility] [facility] [id number]. [phone] Fax. [phone], KatWilburn Mylarm acting as scribe for Dr. GusVirgie Dadagrinat.  I, GusLurline Del, have reviewed the above documentation for accuracy and completeness, and I agree with the above.   *Total Encounter Time as defined by the Centers for Medicare and Medicaid Services includes, in addition to the face-to-face time of a patient visit (documented in the note above) non-face-to-face time: obtaining and reviewing outside history, ordering and reviewing medications, tests or procedures, care coordination (communications with other health care professionals or caregivers) and documentation in the medical record.

## 2021-01-23 ENCOUNTER — Other Ambulatory Visit: Payer: Self-pay | Admitting: Oncology

## 2021-01-30 ENCOUNTER — Telehealth: Payer: Self-pay | Admitting: Oncology

## 2021-01-30 NOTE — Telephone Encounter (Signed)
Sch per 11/14 los, left msg

## 2021-02-16 ENCOUNTER — Ambulatory Visit (HOSPITAL_BASED_OUTPATIENT_CLINIC_OR_DEPARTMENT_OTHER): Payer: BC Managed Care – PPO | Admitting: Obstetrics & Gynecology

## 2021-03-26 ENCOUNTER — Other Ambulatory Visit (HOSPITAL_BASED_OUTPATIENT_CLINIC_OR_DEPARTMENT_OTHER): Payer: Self-pay | Admitting: Obstetrics & Gynecology

## 2021-03-26 ENCOUNTER — Encounter (HOSPITAL_BASED_OUTPATIENT_CLINIC_OR_DEPARTMENT_OTHER): Payer: Self-pay | Admitting: Obstetrics & Gynecology

## 2021-03-26 DIAGNOSIS — Z1231 Encounter for screening mammogram for malignant neoplasm of breast: Secondary | ICD-10-CM

## 2021-03-29 ENCOUNTER — Encounter: Payer: Self-pay | Admitting: Dermatology

## 2021-03-29 ENCOUNTER — Other Ambulatory Visit: Payer: Self-pay

## 2021-03-29 ENCOUNTER — Ambulatory Visit: Payer: BC Managed Care – PPO | Admitting: Dermatology

## 2021-03-29 DIAGNOSIS — L578 Other skin changes due to chronic exposure to nonionizing radiation: Secondary | ICD-10-CM | POA: Diagnosis not present

## 2021-03-29 DIAGNOSIS — Z85828 Personal history of other malignant neoplasm of skin: Secondary | ICD-10-CM

## 2021-03-29 DIAGNOSIS — D1800 Hemangioma unspecified site: Secondary | ICD-10-CM

## 2021-03-29 DIAGNOSIS — Z1283 Encounter for screening for malignant neoplasm of skin: Secondary | ICD-10-CM | POA: Diagnosis not present

## 2021-03-29 DIAGNOSIS — L309 Dermatitis, unspecified: Secondary | ICD-10-CM

## 2021-03-29 DIAGNOSIS — L814 Other melanin hyperpigmentation: Secondary | ICD-10-CM

## 2021-03-29 DIAGNOSIS — L821 Other seborrheic keratosis: Secondary | ICD-10-CM

## 2021-03-29 DIAGNOSIS — D229 Melanocytic nevi, unspecified: Secondary | ICD-10-CM

## 2021-03-29 MED ORDER — TRIAMCINOLONE ACETONIDE 0.1 % EX CREA: 1.0000 "application " | TOPICAL_CREAM | Freq: Two times a day (BID) | CUTANEOUS | 2 refills | Status: DC | PRN

## 2021-03-29 NOTE — Patient Instructions (Addendum)
Triamcinolone 0.1% cream twice daily up to 2 weeks as needed. Avoid applying to face, groin, and axilla. Use as directed. Long-term use can cause thinning of the skin.  Topical steroids (such as triamcinolone, fluocinolone, fluocinonide, mometasone, clobetasol, halobetasol, betamethasone, hydrocortisone) can cause thinning and lightening of the skin if they are used for too long in the same area. Your physician has selected the right strength medicine for your problem and area affected on the body. Please use your medication only as directed by your physician to prevent side effects.    Recommend taking Heliocare sun protection supplement daily in sunny weather for additional sun protection. For maximum protection on the sunniest days, you can take up to 2 capsules of regular Heliocare OR take 1 capsule of Heliocare Ultra. For prolonged exposure (such as a full day in the sun), you can repeat your dose of the supplement 4 hours after your first dose. Heliocare can be purchased at [employer], at some [facility] or at [email or web address].    Recommend Niacinamide or Nicotinamide 500mg  twice per day to lower risk of non-melanoma skin cancer by approximately 25%. This is usually available at [employer].  Recommend daily broad spectrum sunscreen SPF 30+ to sun-exposed areas, reapply every 2 hours as needed. Call for new or changing lesions.  Staying in the shade or wearing long sleeves, sun glasses (UVA+UVB protection) and wide brim hats (4-inch brim around the entire circumference of the hat) are also recommended for sun protection.   Gentle Skin Care Guide  1. Bathe no more than once a day.  2. Avoid bathing in hot water  3. Use a mild soap like Dove, Vanicream, Cetaphil, CeraVe. Can use Lever 2000 or Cetaphil antibacterial soap  4. Use soap only where you need it. On most days, use it under your arms, between your legs, and on your feet. Let the water rinse other areas unless visibly  dirty.  5. When you get out of the bath/shower, use a towel to gently blot your skin dry, don't rub it.  6. While your skin is still a little damp, apply a moisturizing cream such as Vanicream, CeraVe, Cetaphil, Eucerin, Sarna lotion or plain Vaseline Jelly. For hands apply Neutrogena [place] Hand Cream or Excipial Hand Cream.  7. Reapply moisturizer any time you start to itch or feel dry.  8. Sometimes using free and clear laundry detergents can be helpful. Fabric softener sheets should be avoided. Downy Free & Gentle liquid, or any liquid fabric softener that is free of dyes and perfumes, it acceptable to use  9. If your doctor has given you prescription creams you may apply moisturizers over them     If You Need Anything After Your Visit  If you have any questions or concerns for your doctor, please call our main line at [phone] and press option 4 to reach your doctor's medical assistant. If no one answers, please leave a voicemail as directed and we will return your call as soon as possible. Messages left after 4 pm will be answered the following business day.   You may also send [place] a message via [place]. We typically respond to MyChart messages within 1-2 business days.  For prescription refills, please ask your pharmacy to contact our office. Our fax number is [phone].  If you have an urgent issue when the clinic is closed that cannot wait until the next business day, you can page your doctor at the number below.    Please note that  while we do our best to be available for urgent issues outside of office hours, we are not available 24/7.   If you have an urgent issue and are unable to reach [place], you may choose to seek medical care at your doctor's office, retail clinic, urgent care center, or emergency room.  If you have a medical emergency, please immediately call 911 or go to the emergency department.  Pager Numbers  - Dr. Nehemiah Massed: [phone]  - Dr. Laurence Ferrari:  [phone]  - Dr. Nicole Kindred: [phone]  In the event of inclement weather, please call our main line at [phone] for an update on the status of any delays or closures.  Dermatology Medication Tips: Please keep the boxes that topical medications come in in order to help keep track of the instructions about where and how to use these. Pharmacies typically print the medication instructions only on the boxes and not directly on the medication tubes.   If your medication is too expensive, please contact our office at [phone] option 4 or send [place] a message through [place].   We are unable to tell what your co-pay for medications will be in advance as this is different depending on your insurance coverage. However, we may be able to find a substitute medication at lower cost or fill out paperwork to get insurance to cover a needed medication.   If a prior authorization is required to get your medication covered by your insurance company, please allow [place] 1-2 business days to complete this process.  Drug prices often vary depending on where the prescription is filled and some pharmacies may offer cheaper prices.  The website www.goodrx.com contains coupons for medications through different pharmacies. The prices here do not account for what the cost may be with help from insurance (it may be cheaper with your insurance), but the website can give you the price if you did not use any insurance.  - You can print the associated coupon and take it with your prescription to the pharmacy.  - You may also stop by our office during regular business hours and pick up a GoodRx coupon card.  - If you need your prescription sent electronically to a different pharmacy, notify our office through [facility] or by phone at [phone] option 4.     Si Usted Necesita Algo Despus de Su Visita  Tambin puede enviarnos un mensaje a travs de Pharmacist, community. Por lo general respondemos a los mensajes de  MyChart en el transcurso de 1 a 2 das hbiles.  Para renovar recetas, por favor pida a su farmacia que se ponga en contacto con nuestra oficina. Harland Dingwall de fax es [place] [phone].  Si tiene un asunto urgente cuando la clnica est cerrada y que no puede esperar hasta el siguiente da hbil, puede llamar/localizar a su doctor(a) al nmero que aparece a continuacin.   Por favor, tenga en cuenta que aunque hacemos todo lo posible para estar disponibles para asuntos urgentes fuera del horario de [place], no estamos disponibles las 24 horas del da, los 7 das de la [place].   Si tiene un problema urgente y no puede comunicarse con nosotros, puede optar por buscar atencin mdica  en el consultorio de su doctor(a), en una clnica privada, en un centro de atencin urgente o en una sala de emergencias.  Si tiene Engineering geologist, por favor llame inmediatamente al 911 o vaya a la sala de emergencias.  Nmeros de bper  - Dr. Nehemiah Massed: [phone]  -  DraLaurence Ferrari: [phone]  - Dra. Nicole Kindred: [phone]  En caso de inclemencias del [place], por favor llame a Johnsie Kindred principal al [phone] para una actualizacin sobre el [place] de cualquier retraso o cierre.  Consejos para la medicacin en dermatologa: Por favor, guarde las cajas en las que vienen los medicamentos de uso tpico para ayudarle a seguir las instrucciones sobre dnde y cmo usarlos. Las farmacias generalmente imprimen las instrucciones del medicamento slo en las cajas y no directamente en los tubos del [place].   Si su medicamento es muy caro, por favor, pngase en contacto con Zigmund Daniel llamando al [phone] y presione la opcin 4 o envenos un mensaje a travs de Pharmacist, community.   No podemos decirle cul ser su copago por los medicamentos por adelantado ya que esto es diferente dependiendo de la cobertura de su seguro. Sin embargo, es posible que podamos encontrar un medicamento sustituto a Contractor un formulario para que el seguro cubra el medicamento que se considera necesario.   Si se requiere una autorizacin previa para que su compaa de seguros [place] su medicamento, por favor permtanos de 1 a 2 das hbiles para completar este proceso.  Los precios de los medicamentos varan con frecuencia dependiendo del Environmental consultant de dnde se surte la receta y alguna farmacias pueden ofrecer precios ms baratos.  El sitio web www.goodrx.com tiene cupones para medicamentos de Airline pilot. Los precios aqu no tienen en cuenta lo que podra costar con la ayuda del seguro (puede ser ms barato con su seguro), pero el sitio web puede darle el precio si no utiliz Research scientist (physical sciences).  - Puede imprimir el cupn correspondiente y llevarlo con su receta a la farmacia.  - Tambin puede pasar por nuestra oficina durante el horario de atencin regular y Charity fundraiser una tarjeta de cupones de GoodRx.  - Si necesita que su receta se enve electrnicamente a una farmacia diferente, informe a nuestra oficina a travs de MyChart de [place] o por telfono llamando al [phone] y presione la opcin 4.

## 2021-03-29 NOTE — Progress Notes (Addendum)
New Patient Visit   Subjective  Kirsten Wilson is a 41 y.o. female who presents for the following: Annual Exam (Here for skin cancer screening. No hx of DN. Hx of BCC on back 2019).  The patient presents for Total-Body Skin Exam (TBSE) for skin cancer screening and mole check.  The patient has spots, moles and lesions to be evaluated, some may be new or changing and the patient has concerns that these could be cancer.  Hands have been irritated.   The following portions of the chart were reviewed this encounter and updated as appropriate:  Tobacco   Allergies   Meds   Problems   Med Hx   Surg Hx   Fam Hx       Review of Systems: No other skin or systemic complaints except as noted in HPI or Assessment and Plan.   Objective  Well appearing patient in no apparent distress; mood and affect are within normal limits.  A full examination was performed including scalp, head, eyes, ears, nose, lips, neck, chest, axillae, abdomen, back, buttocks, bilateral upper extremities, bilateral lower extremities, hands, feet, fingers, toes, fingernails, and toenails. All findings within normal limits unless otherwise noted below.  dorsal hands Pink scaly plaques at hands   Assessment & Plan   Lentigines - Scattered tan macules - Due to sun exposure - Benign-appearing, observe - Recommend daily broad spectrum sunscreen SPF 30+ to sun-exposed areas, reapply every 2 hours as needed. - Call for any changes  Seborrheic Keratoses - Stuck-on, waxy, tan-brown papules and/or plaques  - Benign-appearing - Discussed benign etiology and prognosis. - Observe - Call for any changes  Melanocytic Nevi - Tan-brown and/or pink-flesh-colored symmetric macules and papules - Benign appearing on exam today - Observation - Call clinic for new or changing moles - Recommend daily use of broad spectrum spf 30+ sunscreen to sun-exposed areas.   Hemangiomas - Red papules - Discussed benign nature -  Observe - Call for any changes  Actinic Damage - Chronic condition, secondary to cumulative UV/sun exposure - diffuse scaly erythematous macules with underlying dyspigmentation - Recommend daily broad spectrum sunscreen SPF 30+ to sun-exposed areas, reapply every 2 hours as needed.  - Staying in the shade or wearing long sleeves, sun glasses (UVA+UVB protection) and wide brim hats (4-inch brim around the entire circumference of the hat) are also recommended for sun protection.  - Call for new or changing lesions.  History of Basal Cell Carcinoma of the Skin - No evidence of recurrence today at left upper back - Recommend regular full body skin exams - Recommend daily broad spectrum sunscreen SPF 30+ to sun-exposed areas, reapply every 2 hours as needed.  - Call if any new or changing lesions are noted between office visits   Skin cancer screening performed today.  Hand dermatitis dorsal hands  Chronic condition with duration or expected duration over one year. Condition is bothersome to patient. Currently flared.  Start Triamcinolone 0.1% cream twice daily up to 2 weeks as needed. Avoid applying to face, groin, and axilla. Use as directed. Long-term use can cause thinning of the skin.  Topical steroids (such as triamcinolone, fluocinolone, fluocinonide, mometasone, clobetasol, halobetasol, betamethasone, hydrocortisone) can cause thinning and lightening of the skin if they are used for too long in the same area. Your physician has selected the right strength medicine for your problem and area affected on the body. Please use your medication only as directed by your physician to prevent side effects.  Hand Dermatitis is a chronic type of eczema that can come and go on the hands and fingers.  While there is no cure, the rash and symptoms can be managed with topical prescription medications, and for more severe cases, with systemic medications.  Recommend mild soap and routine use of  moisturizing cream after handwashing.  Minimize soap/water exposure when possible.     triamcinolone cream (KENALOG) 0.1 % - dorsal hands Apply 1 application topically 2 (two) times daily as needed. Use up to 2 weeks as needed. Avoid applying to face, groin, and axilla. Use as directed. Long-term use can cause thinning of the skin.   Return in about 1 year (around 03/29/2022) for TBSE.  I, Emelia Salisbury, CMA, am acting as scribe for Forest Gleason, MD.  Documentation: I have reviewed the above documentation for accuracy and completeness, and I agree with the above.  Forest Gleason, MD

## 2021-03-30 ENCOUNTER — Encounter: Payer: Self-pay | Admitting: Dermatology

## 2021-04-02 NOTE — Addendum Note (Signed)
Addended by: Alfonso Patten on: 04/02/2021 09:17 PM   Modules accepted: Level of Service

## 2021-06-07 ENCOUNTER — Ambulatory Visit
Admission: RE | Admit: 2021-06-07 | Discharge: 2021-06-07 | Disposition: A | Discharge status: Home / Self Care | Payer: BC Managed Care – PPO | Source: Ambulatory Visit | Attending: Obstetrics & Gynecology | Admitting: Obstetrics & Gynecology

## 2021-06-07 DIAGNOSIS — Z1231 Encounter for screening mammogram for malignant neoplasm of breast: Secondary | ICD-10-CM

## 2021-06-11 ENCOUNTER — Other Ambulatory Visit: Payer: Self-pay | Admitting: Obstetrics & Gynecology

## 2021-06-11 DIAGNOSIS — R928 Other abnormal and inconclusive findings on diagnostic imaging of breast: Secondary | ICD-10-CM

## 2021-06-12 ENCOUNTER — Other Ambulatory Visit: Payer: Self-pay | Admitting: Family Medicine

## 2021-06-18 ENCOUNTER — Ambulatory Visit
Admission: RE | Admit: 2021-06-18 | Discharge: 2021-06-18 | Disposition: A | Discharge status: Home / Self Care | Payer: BC Managed Care – PPO | Source: Ambulatory Visit | Attending: Obstetrics & Gynecology | Admitting: Obstetrics & Gynecology

## 2021-06-18 ENCOUNTER — Other Ambulatory Visit: Payer: Self-pay | Admitting: Obstetrics & Gynecology

## 2021-06-18 DIAGNOSIS — R928 Other abnormal and inconclusive findings on diagnostic imaging of breast: Secondary | ICD-10-CM

## 2021-06-18 DIAGNOSIS — R921 Mammographic calcification found on diagnostic imaging of breast: Secondary | ICD-10-CM

## 2021-06-20 ENCOUNTER — Encounter: Payer: Self-pay | Admitting: Family Medicine

## 2021-06-20 ENCOUNTER — Ambulatory Visit (INDEPENDENT_AMBULATORY_CARE_PROVIDER_SITE_OTHER): Payer: BC Managed Care – PPO | Admitting: Family Medicine

## 2021-06-20 VITALS — BP 136/82 | HR 61 | Temp 98.3°F | Ht 62.5 in | Wt 154.2 lb

## 2021-06-20 DIAGNOSIS — Z0000 Encounter for general adult medical examination without abnormal findings: Secondary | ICD-10-CM | POA: Diagnosis not present

## 2021-06-20 DIAGNOSIS — F411 Generalized anxiety disorder: Secondary | ICD-10-CM

## 2021-06-20 DIAGNOSIS — G43009 Migraine without aura, not intractable, without status migrainosus: Secondary | ICD-10-CM

## 2021-06-20 DIAGNOSIS — Z1239 Encounter for other screening for malignant neoplasm of breast: Secondary | ICD-10-CM

## 2021-06-20 MED ORDER — ESCITALOPRAM OXALATE 20 MG PO TABS: 20.0000 mg | ORAL_TABLET | Freq: Every day | ORAL | 3 refills | Status: DC

## 2021-06-20 MED ORDER — BUSPIRONE HCL 15 MG PO TABS
ORAL_TABLET | ORAL | 3 refills | Status: DC
Start: 2021-06-20 — End: 2021-07-11

## 2021-06-20 NOTE — Progress Notes (Signed)
? ?Subjective:  ? ? Patient ID: Kirsten Wilson, female    DOB: 1980/10/04, 41 y.o.   MRN: [id number] ? ?HPI ?Here for health maintenance exam and to review chronic medical problems   ? ?Wt Readings from Last 3 Encounters:  ?06/20/21 154 lb 4 oz (70 kg)  ?01/15/21 160 lb 1.6 oz (72.6 kg)  ?12/13/20 158 lb (71.7 kg)  ? ?27.76 kg/m? ? ?Doing well  ?Finished with school  ?Loosing weight  ?Is back into running  ? ?Has 5 k soon  ?Wearing sunscreen  ?Sees dermatologist  ? ? ?Immunization History  ?Administered Date(s) Administered  ? Influenza,inj,Quad PF,6+ Mos 03/30/2014, 01/07/2017, 12/24/2017, 11/23/2018, 11/23/2019, 12/13/2020  ? Moderna Sars-Covid-2 Vaccination 05/01/2019, 05/29/2019, 01/22/2020  ? Tdap 06/14/2014  ? ?Had a flu shot this fall  ?Also the covid booster  ? ?Pap 12/2020 with gyn  ?OC 1/20 for contraception / periods are light with that  ? ?Botox for migraine  ? ?Mammogram 05/2021- had L breast bx ?Will have MRI in October  ?Sees oncology for high cancer risk (fam hx of breast/uterine and melanoma)  ?Neg BRAC ?Self breast exam: no lumps or changes  ? ?Anxiety: doing ok overall  ?This breast bx has been hard -trying not to fixate on that or go online too much  ?Great self care  ?Enjoys work and now that school is done the pressure is less  ? ?Buspar 7.5 mg bid ?Lexapro 20 mg daily  ? ? ?Catches viruses teaching  ? ?Last labs ?Lab Results  ?Component Value Date  ? CREATININE 0.76 01/15/2021  ? BUN 10 01/15/2021  ? NA 140 01/15/2021  ? K 4.2 01/15/2021  ? CL 107 01/15/2021  ? CO2 23 01/15/2021  ? ?Lab Results  ?Component Value Date  ? ALT 14 01/15/2021  ? AST 18 01/15/2021  ? ALKPHOS 43 01/15/2021  ? BILITOT 0.2 (L) 01/15/2021  ? ?Lab Results  ?Component Value Date  ? WBC 6.8 01/15/2021  ? HGB 13.2 01/15/2021  ? HCT 39.2 01/15/2021  ? MCV 88.1 01/15/2021  ? PLT 206 01/15/2021  ?Glucose 90 ? ? ?Lab Results  ?Component Value Date  ? TSH 1.240 08/31/2018  ?  ?Lab Results  ?Component Value Date  ? CHOL 138  08/31/2018  ? HDL 51 08/31/2018  ? [place] 63 08/31/2018  ? TRIG 119 08/31/2018  ? CHOLHDL 2.7 08/31/2018  ?Wants to hold off on lipid and thyroid  ? ?Patient Active Problem List  ? Diagnosis Date Noted  ? Generalized anxiety disorder 06/06/2017  ? 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 Cervical polyp 08/26/2014  ? Routine general medical examination at a health care facility 06/08/2014  ? Genetic testing 05/27/2014  ? Family history of uterine cancer   ? Breast cancer screening, high risk patient 03/30/2014  ? Allergic rhinitis 03/30/2014  ? Migraine 03/30/2014  ? History of depression 03/30/2014  ? Family history of breast cancer 03/30/2014  ? ?Past Medical History:  ?Diagnosis Date  ? Allergy   ? Basal cell carcinoma 11/05/2017  ? Left upper back. Nodular pattern. Excised  ? Depression   ? Family history of malignant neoplasm of breast   ? Family history of uterine cancer   ? Migraine   ? UTI (lower urinary tract infection)   ? ?Past Surgical History:  ?Procedure Laterality Date  ? BREAST BIOPSY  2011  ? benign  ? BREAST BIOPSY Right 2020  ? BREAST EXCISIONAL BIOPSY Left   ? TONSILLECTOMY AND ADENOIDECTOMY  1993  ? ?Social History  ? ?Tobacco Use  ? Smoking status: Never  ? Smokeless tobacco: Never  ?Vaping Use  ? Vaping Use: Never used  ?Substance Use Topics  ? Alcohol use: Yes  ?  Alcohol/week: 4.0 standard drinks  ?  Types: 4 Standard drinks or equivalent per week  ? Drug use: No  ? ?Family History  ?Problem Relation Age of Onset  ? Breast cancer Mother 49  ? Cancer Mother   ?     uterine @ 31, breast @ 25, and melanoma @ 60  ? Hypertension Mother   ? Asthma Mother   ? Arrhythmia Mother   ? Hyperlipidemia Father   ? Hypertension Father   ? Diabetes Father   ? Heart block Father   ?     had angioplasty  ? Cancer Maternal Aunt   ?     skin & colon polyps (#/type?)  ? Cancer Maternal Uncle   ?     skin  ? Breast cancer Paternal Aunt 14  ? Cancer Paternal Aunt 60  ?     breast; currently 49  ? Cancer Maternal Grandfather    ?     lung; smoker; deceased 68  ? Cancer Paternal Grandmother 72  ?     colon cancer; deceased 84  ? Alcohol abuse Paternal Grandfather   ? Migraines Other   ?     paternal side  ? ?Allergies  ?Allergen Reactions  ? Codone [Hydrocodone]   ? Elemental Sulfur   ? Penicillins   ? ?Current Outpatient Medications on File Prior to Visit  ?Medication Sig Dispense Refill  ? albuterol (VENTOLIN HFA) 108 (90 Base) MCG/ACT inhaler Inhale 2 puffs into the lungs every 6 (six) hours as needed for wheezing or shortness of breath. 8 g 0  ? fluticasone (FLONASE) 50 MCG/ACT nasal spray SHAKE LIQUID AND USE 2 SPRAYS IN EACH NOSTRIL DAILY 16 g 2  ? loratadine (CLARITIN) 10 MG tablet Take 10 mg by mouth daily.    ? norethindrone-ethinyl estradiol-FE (AUROVELA FE 1/20) 1-20 MG-MCG tablet Take 1 tablet by mouth daily. 84 tablet 4  ? OnabotulinumtoxinA (BOTOX IJ) Inject as directed. Reported on 02/23/2015    ? SUMAtriptan (IMITREX) 100 MG tablet Take 100 mg by mouth every 2 (two) hours as needed for migraine or headache. Reported on 02/23/2015    ? triamcinolone cream (KENALOG) 0.1 % Apply 1 application topically 2 (two) times daily as needed. Use up to 2 weeks as needed. Avoid applying to face, groin, and axilla. Use as directed. Long-term use can cause thinning of the skin. 80 g 2  ? ?No current facility-administered medications on file prior to visit.  ?  ? ?Review of Systems  ?Constitutional:  Negative for activity change, appetite change, fatigue, fever and unexpected weight change.  ?HENT:  Negative for congestion, ear pain, rhinorrhea, sinus pressure and sore throat.   ?Eyes:  Negative for pain, redness and visual disturbance.  ?Respiratory:  Negative for cough, shortness of breath and wheezing.   ?Cardiovascular:  Negative for chest pain and palpitations.  ?Gastrointestinal:  Negative for abdominal pain, blood in stool, constipation and diarrhea.  ?Endocrine: Negative for polydipsia and polyuria.  ?Genitourinary:  Negative for  dysuria, frequency and urgency.  ?Musculoskeletal:  Negative for arthralgias, back pain and myalgias.  ?Skin:  Negative for pallor and rash.  ?Allergic/Immunologic: Negative for environmental allergies.  ?Neurological:  Negative for dizziness, syncope and headaches.  ?Hematological:  Negative for adenopathy.  Does not bruise/bleed easily.  ?Psychiatric/Behavioral:  Negative for decreased concentration, dysphoric mood and sleep disturbance. The patient is nervous/anxious.   ? ?   ?Objective:  ? Physical Exam ?Constitutional:   ?   General: She is not in acute distress. ?   Appearance: Normal appearance. She is well-developed and normal weight. She is not ill-appearing or diaphoretic.  ?HENT:  ?   Head: Normocephalic and atraumatic.  ?   Right Ear: Tympanic membrane, ear canal and external ear normal.  ?   Left Ear: Tympanic membrane, ear canal and external ear normal.  ?   Nose: Nose normal. No congestion.  ?   Mouth/Throat:  ?   Mouth: Mucous membranes are moist.  ?   Pharynx: Oropharynx is clear. No posterior oropharyngeal erythema.  ?Eyes:  ?   General: No scleral icterus. ?   Extraocular Movements: Extraocular movements intact.  ?   Conjunctiva/sclera: Conjunctivae normal.  ?   Pupils: Pupils are equal, round, and reactive to light.  ?Neck:  ?   Thyroid: No thyromegaly.  ?   Vascular: No carotid bruit or JVD.  ?Cardiovascular:  ?   Rate and Rhythm: Normal rate and regular rhythm.  ?   Pulses: Normal pulses.  ?   Heart sounds: Normal heart sounds.  ?  No gallop.  ?Pulmonary:  ?   Effort: Pulmonary effort is normal. No respiratory distress.  ?   Breath sounds: Normal breath sounds. No wheezing.  ?   Comments: Good air exch ?Chest:  ?   Chest wall: No tenderness.  ?Abdominal:  ?   General: Bowel sounds are normal. There is no distension or abdominal bruit.  ?   Palpations: Abdomen is soft. There is no mass.  ?   Tenderness: There is no abdominal tenderness.  ?   Hernia: No hernia is present.  ?Genitourinary: ?    Comments: Breast and pelvic exams are utd  ?Musculoskeletal:     ?   General: No tenderness. Normal range of motion.  ?   Cervical back: Normal range of motion and neck supple. No rigidity. No muscular tenderne

## 2021-06-20 NOTE — Patient Instructions (Addendum)
Take care of yourself  ? ? ?Continue dermatology care and sunscreen and exercise  ?Get a flu shot in the fall  ? ?No change in medicines  ?

## 2021-06-20 NOTE — Assessment & Plan Note (Signed)
Reviewed health habits including diet and exercise and skin cancer prevention ?Reviewed appropriate screening tests for age  ?Also reviewed health mt list, fam hx and immunization status , as well as social and family history   ?Good self care ?See HPI ?Labs reviewed from past (good cholesterol in 2020, declined a lipid profile this year)  ?Pap and mammogram utd (pending breast bx) ?utd flu and covid and tetnus vaccines  ?utd derm care and uses sun protection  ?

## 2021-06-20 NOTE — Assessment & Plan Note (Signed)
Continues specialist care with botox ?

## 2021-06-20 NOTE — Assessment & Plan Note (Signed)
Gets mammograms/ MRI and sees oncology  ?Recent calcifications on mammo-planned biopsy next week  ? ?

## 2021-06-20 NOTE — Assessment & Plan Note (Signed)
Overall doing ok with buspar 7.5 mg bid and lexapro 20 mg daily  ? ?Reviewed stressors/ coping techniques/symptoms/ support sources/ tx options and side effects in detail today  ?Very good self care and exercise  ?Some worry about upcoming breast bx but handling it well  ?Enc her to continue running and outdoor time  ?Offered counseling help in future if needed  ?Would not try to wean medication until this next life transition  ?

## 2021-06-21 ENCOUNTER — Other Ambulatory Visit: Payer: Self-pay | Admitting: Family Medicine

## 2021-06-27 ENCOUNTER — Ambulatory Visit
Admission: RE | Admit: 2021-06-27 | Discharge: 2021-06-27 | Disposition: A | Discharge status: Home / Self Care | Payer: BC Managed Care – PPO | Source: Ambulatory Visit | Attending: Obstetrics & Gynecology | Admitting: Obstetrics & Gynecology

## 2021-06-27 DIAGNOSIS — R921 Mammographic calcification found on diagnostic imaging of breast: Secondary | ICD-10-CM

## 2021-07-10 ENCOUNTER — Other Ambulatory Visit: Payer: Self-pay | Admitting: Family Medicine

## 2021-07-11 ENCOUNTER — Other Ambulatory Visit: Payer: Self-pay | Admitting: Family Medicine

## 2021-07-11 NOTE — Telephone Encounter (Signed)
Is this okay to refill? 

## 2021-08-07 ENCOUNTER — Other Ambulatory Visit: Payer: Self-pay | Admitting: General Surgery

## 2021-08-07 DIAGNOSIS — Z1239 Encounter for other screening for malignant neoplasm of breast: Secondary | ICD-10-CM

## 2021-08-16 ENCOUNTER — Encounter (HOSPITAL_BASED_OUTPATIENT_CLINIC_OR_DEPARTMENT_OTHER): Payer: Self-pay | Admitting: General Surgery

## 2021-08-24 ENCOUNTER — Ambulatory Visit
Admission: RE | Admit: 2021-08-24 | Discharge: 2021-08-24 | Disposition: A | Discharge status: Home / Self Care | Payer: BC Managed Care – PPO | Source: Ambulatory Visit | Attending: General Surgery | Admitting: General Surgery

## 2021-08-24 DIAGNOSIS — Z1239 Encounter for other screening for malignant neoplasm of breast: Secondary | ICD-10-CM

## 2021-08-24 MED ORDER — ENSURE PRE-SURGERY PO LIQD: 296.0000 mL | Freq: Once | ORAL | Status: DC

## 2021-08-27 ENCOUNTER — Ambulatory Visit (HOSPITAL_BASED_OUTPATIENT_CLINIC_OR_DEPARTMENT_OTHER): Payer: BC Managed Care – PPO | Admitting: Anesthesiology

## 2021-08-27 ENCOUNTER — Other Ambulatory Visit: Payer: Self-pay

## 2021-08-27 ENCOUNTER — Encounter (HOSPITAL_BASED_OUTPATIENT_CLINIC_OR_DEPARTMENT_OTHER)
Admission: RE | Disposition: A | Discharge status: Home / Self Care | Payer: Self-pay | Source: Home / Self Care | Attending: General Surgery

## 2021-08-27 ENCOUNTER — Ambulatory Visit (HOSPITAL_BASED_OUTPATIENT_CLINIC_OR_DEPARTMENT_OTHER)
Admission: RE | Admit: 2021-08-27 | Discharge: 2021-08-27 | Disposition: A | Discharge status: Home / Self Care | Payer: BC Managed Care – PPO | Attending: General Surgery | Admitting: General Surgery

## 2021-08-27 ENCOUNTER — Ambulatory Visit
Admission: RE | Admit: 2021-08-27 | Discharge: 2021-08-27 | Disposition: A | Discharge status: Home / Self Care | Payer: BC Managed Care – PPO | Source: Ambulatory Visit | Attending: General Surgery | Admitting: General Surgery

## 2021-08-27 ENCOUNTER — Encounter (HOSPITAL_BASED_OUTPATIENT_CLINIC_OR_DEPARTMENT_OTHER): Payer: Self-pay | Admitting: General Surgery

## 2021-08-27 DIAGNOSIS — Z1239 Encounter for other screening for malignant neoplasm of breast: Secondary | ICD-10-CM

## 2021-08-27 DIAGNOSIS — R921 Mammographic calcification found on diagnostic imaging of breast: Secondary | ICD-10-CM | POA: Insufficient documentation

## 2021-08-27 DIAGNOSIS — Z803 Family history of malignant neoplasm of breast: Secondary | ICD-10-CM | POA: Diagnosis not present

## 2021-08-27 DIAGNOSIS — N6022 Fibroadenosis of left breast: Secondary | ICD-10-CM | POA: Diagnosis not present

## 2021-08-27 DIAGNOSIS — J45909 Unspecified asthma, uncomplicated: Secondary | ICD-10-CM | POA: Insufficient documentation

## 2021-08-27 DIAGNOSIS — Z01818 Encounter for other preprocedural examination: Secondary | ICD-10-CM

## 2021-08-27 HISTORY — PX: RADIOACTIVE SEED GUIDED EXCISIONAL BREAST BIOPSY: SHX6490

## 2021-08-27 HISTORY — DX: Unspecified asthma, uncomplicated: J45.909

## 2021-08-27 HISTORY — DX: Anxiety disorder, unspecified: F41.9

## 2021-08-27 LAB — POCT PREGNANCY, URINE: Preg Test, Ur: NEGATIVE

## 2021-08-27 SURGERY — RADIOACTIVE SEED GUIDED BREAST BIOPSY
Anesthesia: General | Site: Breast | Laterality: Left

## 2021-08-27 MED ORDER — ACETAMINOPHEN 325 MG PO TABS: 325.0000 mg | ORAL_TABLET | ORAL | Status: DC | PRN

## 2021-08-27 MED ORDER — MEPERIDINE HCL 25 MG/ML IJ SOLN: 6.2500 mg | INTRAMUSCULAR | Status: DC | PRN

## 2021-08-27 MED ORDER — FENTANYL CITRATE (PF) 100 MCG/2ML IJ SOLN
INTRAMUSCULAR | Status: DC | PRN
  Administered 2021-08-27 (×2): 25 ug via INTRAVENOUS
  Administered 2021-08-27: 50 ug via INTRAVENOUS

## 2021-08-27 MED ORDER — FENTANYL CITRATE (PF) 100 MCG/2ML IJ SOLN: 25.0000 ug | INTRAMUSCULAR | Status: DC | PRN

## 2021-08-27 MED ORDER — PROPOFOL 10 MG/ML IV BOLUS
INTRAVENOUS | Status: AC
  Filled 2021-08-27: qty 20

## 2021-08-27 MED ORDER — LACTATED RINGERS IV SOLN: INTRAVENOUS | Status: DC

## 2021-08-27 MED ORDER — BUPIVACAINE HCL (PF) 0.25 % IJ SOLN
INTRAMUSCULAR | Status: DC | PRN
  Administered 2021-08-27: 8 mL

## 2021-08-27 MED ORDER — KETOROLAC TROMETHAMINE 15 MG/ML IJ SOLN
15.0000 mg | INTRAMUSCULAR | Status: AC
  Administered 2021-08-27: 15 mg via INTRAVENOUS

## 2021-08-27 MED ORDER — OXYCODONE HCL 5 MG/5ML PO SOLN: 5.0000 mg | Freq: Once | ORAL | Status: DC | PRN

## 2021-08-27 MED ORDER — CIPROFLOXACIN IN D5W 400 MG/200ML IV SOLN
INTRAVENOUS | Status: AC
  Filled 2021-08-27: qty 200

## 2021-08-27 MED ORDER — ONDANSETRON HCL 4 MG/2ML IJ SOLN
INTRAMUSCULAR | Status: DC | PRN
  Administered 2021-08-27: 4 mg via INTRAVENOUS

## 2021-08-27 MED ORDER — CHLORHEXIDINE GLUCONATE CLOTH 2 % EX PADS: 6.0000 | MEDICATED_PAD | Freq: Once | CUTANEOUS | Status: DC

## 2021-08-27 MED ORDER — ONDANSETRON HCL 4 MG/2ML IJ SOLN: 4.0000 mg | Freq: Once | INTRAMUSCULAR | Status: DC | PRN

## 2021-08-27 MED ORDER — ONDANSETRON HCL 4 MG/2ML IJ SOLN
INTRAMUSCULAR | Status: AC
  Filled 2021-08-27: qty 2

## 2021-08-27 MED ORDER — MIDAZOLAM HCL 5 MG/5ML IJ SOLN
INTRAMUSCULAR | Status: DC | PRN
  Administered 2021-08-27 (×2): 1 mg via INTRAVENOUS

## 2021-08-27 MED ORDER — DEXAMETHASONE SODIUM PHOSPHATE 10 MG/ML IJ SOLN
INTRAMUSCULAR | Status: DC | PRN
  Administered 2021-08-27: 5 mg via INTRAVENOUS

## 2021-08-27 MED ORDER — OXYCODONE HCL 5 MG PO TABS: 5.0000 mg | ORAL_TABLET | Freq: Once | ORAL | Status: DC | PRN

## 2021-08-27 MED ORDER — MIDAZOLAM HCL 2 MG/2ML IJ SOLN
INTRAMUSCULAR | Status: AC
  Filled 2021-08-27: qty 2

## 2021-08-27 MED ORDER — ACETAMINOPHEN 500 MG PO TABS
ORAL_TABLET | ORAL | Status: AC
  Filled 2021-08-27: qty 2

## 2021-08-27 MED ORDER — KETOROLAC TROMETHAMINE 15 MG/ML IJ SOLN
INTRAMUSCULAR | Status: AC
  Filled 2021-08-27: qty 1

## 2021-08-27 MED ORDER — FENTANYL CITRATE (PF) 100 MCG/2ML IJ SOLN
INTRAMUSCULAR | Status: AC
  Filled 2021-08-27: qty 2

## 2021-08-27 MED ORDER — LIDOCAINE HCL 1 % IJ SOLN
INTRAMUSCULAR | Status: DC | PRN
  Administered 2021-08-27: 50 mg via INTRADERMAL

## 2021-08-27 MED ORDER — CIPROFLOXACIN IN D5W 400 MG/200ML IV SOLN
400.0000 mg | INTRAVENOUS | Status: AC
  Administered 2021-08-27: 400 mg via INTRAVENOUS

## 2021-08-27 MED ORDER — ACETAMINOPHEN 160 MG/5ML PO SOLN: 325.0000 mg | ORAL | Status: DC | PRN

## 2021-08-27 MED ORDER — ACETAMINOPHEN 500 MG PO TABS
1000.0000 mg | ORAL_TABLET | ORAL | Status: AC
  Administered 2021-08-27: 1000 mg via ORAL

## 2021-08-27 SURGICAL SUPPLY — 55 items
ADH SKN CLS APL DERMABOND .7 (GAUZE/BANDAGES/DRESSINGS) ×1
APL PRP STRL LF DISP 70% ISPRP (MISCELLANEOUS) ×1
APPLIER CLIP 9.375 MED OPEN (MISCELLANEOUS)
APR CLP MED 9.3 20 MLT OPN (MISCELLANEOUS)
BINDER BREAST LRG (GAUZE/BANDAGES/DRESSINGS) ×1 IMPLANT
BINDER BREAST XLRG (GAUZE/BANDAGES/DRESSINGS) IMPLANT
BLADE SURG 15 STRL LF DISP TIS (BLADE) ×1 IMPLANT
BLADE SURG 15 STRL SS (BLADE) ×2
CANISTER SUC SOCK COL 7IN (MISCELLANEOUS) IMPLANT
CANISTER SUCT 1200ML W/VALVE (MISCELLANEOUS) IMPLANT
CHLORAPREP W/TINT 26 (MISCELLANEOUS) ×2 IMPLANT
CLIP APPLIE 9.375 MED OPEN (MISCELLANEOUS) IMPLANT
COVER BACK TABLE 60X90IN (DRAPES) ×2 IMPLANT
COVER MAYO STAND STRL (DRAPES) ×2 IMPLANT
COVER PROBE W GEL 5X96 (DRAPES) ×2 IMPLANT
DERMABOND ADVANCED (GAUZE/BANDAGES/DRESSINGS) ×1
DERMABOND ADVANCED .7 DNX12 (GAUZE/BANDAGES/DRESSINGS) ×1 IMPLANT
DRAPE LAPAROSCOPIC ABDOMINAL (DRAPES) ×2 IMPLANT
DRAPE UTILITY XL STRL (DRAPES) ×2 IMPLANT
ELECT COATED BLADE 2.86 ST (ELECTRODE) ×2 IMPLANT
ELECT REM PT RETURN 9FT ADLT (ELECTROSURGICAL) ×2
ELECTRODE REM PT RTRN 9FT ADLT (ELECTROSURGICAL) ×1 IMPLANT
GLOVE BIO SURGEON STRL SZ7 (GLOVE) ×4 IMPLANT
GLOVE BIOGEL PI IND STRL 6.5 (GLOVE) IMPLANT
GLOVE BIOGEL PI IND STRL 7.0 (GLOVE) IMPLANT
GLOVE BIOGEL PI IND STRL 7.5 (GLOVE) ×1 IMPLANT
GLOVE BIOGEL PI INDICATOR 6.5 (GLOVE) ×1
GLOVE BIOGEL PI INDICATOR 7.0 (GLOVE) ×1
GLOVE BIOGEL PI INDICATOR 7.5 (GLOVE) ×1
GLOVE SURG SS PI 6.5 STRL IVOR (GLOVE) ×2 IMPLANT
GOWN STRL REUS W/ TWL LRG LVL3 (GOWN DISPOSABLE) ×2 IMPLANT
GOWN STRL REUS W/TWL LRG LVL3 (GOWN DISPOSABLE) ×6
HEMOSTAT ARISTA ABSORB 3G PWDR (HEMOSTASIS) IMPLANT
KIT MARKER MARGIN INK (KITS) ×2 IMPLANT
NDL HYPO 25X1 1.5 SAFETY (NEEDLE) ×1 IMPLANT
NEEDLE HYPO 25X1 1.5 SAFETY (NEEDLE) ×2 IMPLANT
NS IRRIG 1000ML POUR BTL (IV SOLUTION) IMPLANT
PACK BASIN DAY SURGERY FS (CUSTOM PROCEDURE TRAY) ×2 IMPLANT
PENCIL SMOKE EVACUATOR (MISCELLANEOUS) ×2 IMPLANT
RETRACTOR ONETRAX LX 90X20 (MISCELLANEOUS) ×1 IMPLANT
SLEEVE SCD COMPRESS KNEE MED (STOCKING) ×2 IMPLANT
SPONGE T-LAP 4X18 ~~LOC~~+RFID (SPONGE) ×2 IMPLANT
STRIP CLOSURE SKIN 1/2X4 (GAUZE/BANDAGES/DRESSINGS) ×2 IMPLANT
SUT MNCRL AB 4-0 PS2 18 (SUTURE) IMPLANT
SUT MON AB 5-0 PS2 18 (SUTURE) IMPLANT
SUT SILK 2 0 SH (SUTURE) IMPLANT
SUT VIC AB 2-0 SH 27 (SUTURE) ×2
SUT VIC AB 2-0 SH 27XBRD (SUTURE) ×1 IMPLANT
SUT VIC AB 3-0 SH 27 (SUTURE) ×2
SUT VIC AB 3-0 SH 27X BRD (SUTURE) ×1 IMPLANT
SYR CONTROL 10ML LL (SYRINGE) ×2 IMPLANT
TOWEL GREEN STERILE FF (TOWEL DISPOSABLE) ×2 IMPLANT
TRAY FAXITRON CT DISP (TRAY / TRAY PROCEDURE) ×2 IMPLANT
TUBE CONNECTING 20X1/4 (TUBING) IMPLANT
YANKAUER SUCT BULB TIP NO VENT (SUCTIONS) IMPLANT

## 2021-08-27 NOTE — H&P (Signed)
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 She states that she does have panel genetics negative in 2018. She has a family history of breast cancer in her mom at age 10 as well as a paternal aunt at age 46. She had no mass or discharge. She underwent her screening mammogram that showed D density breast. This had new left breast calcifications. These measured 4 mm in the upper outer quadrant. Biopsy was done that showed flat epithelial atypia. She has been seen by oncology in the past and they are high risk clinic and is undergoing staggered mammogram and MRI. She has had tamoxifen discussed before but is not on that.  Review of Systems: A complete review of systems was obtained from the patient. I have reviewed this information and discussed as appropriate with the patient. See HPI as well for other ROS.  Review of Systems  All other systems reviewed and are negative.   Medical History: Past Medical History:  Diagnosis Date   Anxiety   Asthma, unspecified asthma severity, unspecified whether complicated, unspecified whether persistent   There is no problem list on file for this patient.  Past Surgical History:  Procedure Laterality Date   UNLISTED PROCEDURE PHARYNX/ADENOIDS/TONSILS  1995    Allergies  Allergen Reactions   Hydrocodone Vomiting   Penicillins Hives   Sulfur (Not Sulfa) Swelling   Current Outpatient Medications on File Prior to Visit  Medication Sig Dispense Refill   BLISOVI FE 1/20, 28, 1 mg-20 mcg (21)/75 mg (7) tablet Take 1 tablet by mouth once daily   busPIRone (BUSPAR) 15 MG tablet buspirone 15 mg tablet   escitalopram oxalate (LEXAPRO) 20 MG tablet   loratadine (CLARITIN) 10 mg tablet Take 10 mg by mouth once daily   SUMAtriptan (IMITREX) 100 MG tablet sumatriptan 100 mg tablet   No current facility-administered medications on file prior to visit.   Family History  Problem  Relation Age of Onset   Skin cancer Mother   High blood pressure (Hypertension) Mother   Hyperlipidemia (Elevated cholesterol) Mother   Breast cancer Mother   Diabetes Father   Hyperlipidemia (Elevated cholesterol) Father   High blood pressure (Hypertension) Father   Obesity Father   Stroke Father    Social History   Tobacco Use  Smoking Status Never  Smokeless Tobacco Never    Social History   Socioeconomic History   Marital status: Single  Tobacco Use   Smoking status: Never   Smokeless tobacco: Never  Substance and Sexual Activity   Alcohol use: Yes   Drug use: Not Currently   Objective:   Vitals:  08/02/21 0944  BP: 112/70  Pulse: 104  Weight: 69.7 kg (153 lb 9.6 oz)  Height: 157.5 cm (5\' 2" )   Body mass index is 28.09 kg/m.  Physical Exam Vitals reviewed.  Constitutional:  Appearance: Normal appearance.  Chest:  Breasts: Right: No inverted nipple, mass or nipple discharge.  Left: No inverted nipple, mass or nipple discharge.  Lymphadenopathy:  Upper Body:  Right upper body: No supraclavicular or axillary adenopathy.  Left upper body: No supraclavicular or axillary adenopathy.  Neurological:  Mental Status: She is alert.   Assessment and Plan:   Diagnoses and all orders for this visit:  Flat epithelial atypia of left breast  Left breast radioactive seed guided excisional biopsy  We discussed the options including observation versus excisional biopsy. She does have D density breast and is very high  risk on her Tyer Cusick score. She desires excision and does not want to observe this although that would be an option. We discussed seed guided excisional biopsy. She is also due to follow-up with oncology for the high risk nature and this may give some more information on trying to figure out whether to do chemoprevention in addition to the staggered MRI.

## 2021-08-27 NOTE — Anesthesia Preprocedure Evaluation (Addendum)
Anesthesia Evaluation  Patient identified by MRN, date of birth, ID band Patient awake    Reviewed: Allergy & Precautions, NPO status , Patient's Chart, lab work & pertinent test results  Airway Mallampati: II  TM Distance: >3 FB Neck ROM: Full    Dental no notable dental hx.    Pulmonary asthma ,    Pulmonary exam normal        Cardiovascular negative cardio ROS Normal cardiovascular exam     Neuro/Psych  Headaches, PSYCHIATRIC DISORDERS Anxiety Depression    GI/Hepatic negative GI ROS, Neg liver ROS,   Endo/Other  negative endocrine ROS  Renal/GU negative Renal ROS     Musculoskeletal negative musculoskeletal ROS (+)   Abdominal   Peds  Hematology negative hematology ROS (+)   Anesthesia Other Findings LEFT BREAST MASS  Reproductive/Obstetrics hcg negative                            Anesthesia Physical Anesthesia Plan  ASA: 2  Anesthesia Plan: General   Post-op Pain Management:    Induction: Intravenous  PONV Risk Score and Plan: 3 and Ondansetron, Dexamethasone and Midazolam  Airway Management Planned: LMA  Additional Equipment:   Intra-op Plan:   Post-operative Plan: Extubation in OR  Informed Consent: I have reviewed the patients History and Physical, chart, labs and discussed the procedure including the risks, benefits and alternatives for the proposed anesthesia with the patient or authorized representative who has indicated his/her understanding and acceptance.     Dental advisory given  Plan Discussed with: CRNA  Anesthesia Plan Comments:        Anesthesia Quick Evaluation

## 2021-08-28 ENCOUNTER — Encounter (HOSPITAL_BASED_OUTPATIENT_CLINIC_OR_DEPARTMENT_OTHER): Payer: Self-pay | Admitting: General Surgery

## 2021-08-29 LAB — SURGICAL PATHOLOGY

## 2021-08-31 ENCOUNTER — Encounter (HOSPITAL_COMMUNITY): Payer: Self-pay

## 2021-10-15 ENCOUNTER — Encounter (HOSPITAL_BASED_OUTPATIENT_CLINIC_OR_DEPARTMENT_OTHER): Payer: Self-pay | Admitting: Obstetrics & Gynecology

## 2021-10-16 ENCOUNTER — Other Ambulatory Visit (HOSPITAL_BASED_OUTPATIENT_CLINIC_OR_DEPARTMENT_OTHER): Payer: Self-pay | Admitting: Obstetrics & Gynecology

## 2021-10-16 DIAGNOSIS — Z803 Family history of malignant neoplasm of breast: Secondary | ICD-10-CM

## 2021-10-16 DIAGNOSIS — Z1239 Encounter for other screening for malignant neoplasm of breast: Secondary | ICD-10-CM

## 2021-10-31 ENCOUNTER — Ambulatory Visit (INDEPENDENT_AMBULATORY_CARE_PROVIDER_SITE_OTHER): Payer: BC Managed Care – PPO | Admitting: *Deleted

## 2021-10-31 DIAGNOSIS — Z111 Encounter for screening for respiratory tuberculosis: Secondary | ICD-10-CM

## 2021-11-01 ENCOUNTER — Encounter: Payer: Self-pay | Admitting: Family Medicine

## 2021-11-02 LAB — TB SKIN TEST
Induration: 0 mm
TB Skin Test: NEGATIVE

## 2021-11-13 ENCOUNTER — Other Ambulatory Visit: Payer: Self-pay | Admitting: Obstetrics & Gynecology

## 2021-11-13 ENCOUNTER — Ambulatory Visit
Admission: RE | Admit: 2021-11-13 | Discharge: 2021-11-13 | Disposition: A | Discharge status: Home / Self Care | Payer: BC Managed Care – PPO | Source: Ambulatory Visit | Attending: Obstetrics & Gynecology | Admitting: Obstetrics & Gynecology

## 2021-11-13 ENCOUNTER — Encounter (HOSPITAL_BASED_OUTPATIENT_CLINIC_OR_DEPARTMENT_OTHER): Payer: Self-pay | Admitting: Obstetrics & Gynecology

## 2021-11-13 DIAGNOSIS — R928 Other abnormal and inconclusive findings on diagnostic imaging of breast: Secondary | ICD-10-CM

## 2021-11-13 DIAGNOSIS — Z1239 Encounter for other screening for malignant neoplasm of breast: Secondary | ICD-10-CM

## 2021-11-13 DIAGNOSIS — Z803 Family history of malignant neoplasm of breast: Secondary | ICD-10-CM

## 2021-11-13 MED ORDER — GADOBUTROL 1 MMOL/ML IV SOLN
7.0000 mL | Freq: Once | INTRAVENOUS | Status: AC | PRN
  Administered 2021-11-13: 7 mL via INTRAVENOUS

## 2021-11-16 ENCOUNTER — Ambulatory Visit
Admission: RE | Admit: 2021-11-16 | Discharge: 2021-11-16 | Disposition: A | Discharge status: Home / Self Care | Payer: BC Managed Care – PPO | Source: Ambulatory Visit | Attending: Obstetrics & Gynecology | Admitting: Obstetrics & Gynecology

## 2021-11-16 DIAGNOSIS — R928 Other abnormal and inconclusive findings on diagnostic imaging of breast: Secondary | ICD-10-CM

## 2021-11-16 MED ORDER — GADOBUTROL 1 MMOL/ML IV SOLN
7.0000 mL | Freq: Once | INTRAVENOUS | Status: AC | PRN
  Administered 2021-11-16: 7 mL via INTRAVENOUS

## 2021-12-17 ENCOUNTER — Ambulatory Visit (HOSPITAL_BASED_OUTPATIENT_CLINIC_OR_DEPARTMENT_OTHER): Payer: BC Managed Care – PPO | Admitting: Obstetrics & Gynecology

## 2021-12-27 ENCOUNTER — Inpatient Hospital Stay (HOSPITAL_BASED_OUTPATIENT_CLINIC_OR_DEPARTMENT_OTHER): Payer: BC Managed Care – PPO | Admitting: Hematology and Oncology

## 2021-12-27 ENCOUNTER — Inpatient Hospital Stay: Payer: BC Managed Care – PPO | Attending: Hematology and Oncology

## 2021-12-27 ENCOUNTER — Other Ambulatory Visit: Payer: Self-pay | Admitting: *Deleted

## 2021-12-27 VITALS — BP 121/82 | HR 64 | Temp 97.7°F | Resp 16 | Ht 62.0 in | Wt 154.5 lb

## 2021-12-27 DIAGNOSIS — Z1239 Encounter for other screening for malignant neoplasm of breast: Secondary | ICD-10-CM | POA: Diagnosis not present

## 2021-12-27 DIAGNOSIS — Z800 Family history of malignant neoplasm of digestive organs: Secondary | ICD-10-CM | POA: Diagnosis not present

## 2021-12-27 DIAGNOSIS — Z808 Family history of malignant neoplasm of other organs or systems: Secondary | ICD-10-CM | POA: Diagnosis not present

## 2021-12-27 DIAGNOSIS — N6311 Unspecified lump in the right breast, upper outer quadrant: Secondary | ICD-10-CM | POA: Insufficient documentation

## 2021-12-27 DIAGNOSIS — Z803 Family history of malignant neoplasm of breast: Secondary | ICD-10-CM | POA: Insufficient documentation

## 2021-12-27 LAB — CMP (CANCER CENTER ONLY)
ALT: 13 U/L (ref 0–44)
AST: 17 U/L (ref 15–41)
Albumin: 4.2 g/dL (ref 3.5–5.0)
Alkaline Phosphatase: 47 U/L (ref 38–126)
Anion gap: 5 (ref 5–15)
BUN: 9 mg/dL (ref 6–20)
CO2: 28 mmol/L (ref 22–32)
Calcium: 8.9 mg/dL (ref 8.9–10.3)
Chloride: 105 mmol/L (ref 98–111)
Creatinine: 0.77 mg/dL (ref 0.44–1.00)
GFR, Estimated: >60 mL/min (ref >60)
Glucose, Bld: 101 mg/dL — ABNORMAL HIGH (ref 70–99)
Potassium: 3.9 mmol/L (ref 3.5–5.1)
Sodium: 138 mmol/L (ref 135–145)
Total Bilirubin: 0.3 mg/dL (ref 0.3–1.2)
Total Protein: 6.7 g/dL (ref 6.5–8.1)

## 2021-12-27 LAB — CBC WITH DIFFERENTIAL (CANCER CENTER ONLY)
Abs Immature Granulocytes: 0.01 10*3/uL (ref 0.00–0.07)
Basophils Absolute: 0.1 10*3/uL (ref 0.0–0.1)
Basophils Relative: 1 %
Eosinophils Absolute: 0.1 10*3/uL (ref 0.0–0.5)
Eosinophils Relative: 1 %
HCT: 41.2 % (ref 36.0–46.0)
Hemoglobin: 14.3 g/dL (ref 12.0–15.0)
Immature Granulocytes: 0 %
Lymphocytes Relative: 33 %
Lymphs Abs: 2.4 10*3/uL (ref 0.7–4.0)
MCH: 30.7 pg (ref 26.0–34.0)
MCHC: 34.7 g/dL (ref 30.0–36.0)
MCV: 88.4 fL (ref 80.0–100.0)
Monocytes Absolute: 0.5 10*3/uL (ref 0.1–1.0)
Monocytes Relative: 7 %
Neutro Abs: 4.2 10*3/uL (ref 1.7–7.7)
Neutrophils Relative %: 58 %
Platelet Count: 302 10*3/uL (ref 150–400)
RBC: 4.66 MIL/uL (ref 3.87–5.11)
RDW: 12.1 % (ref 11.5–15.5)
WBC Count: 7.2 10*3/uL (ref 4.0–10.5)
nRBC: 0 % (ref 0.0–0.2)

## 2021-12-27 NOTE — Progress Notes (Incomplete)
[facility]  [phone] [phone]     ID: Kirsten Wilson DOB: April 12, 1980  MR#: [id number]  KDT#:[id number]  Patient Care Team: Abner Greenspan, MD as PCP - General (Family Medicine) Magrinat, Virgie Dad, MD (Inactive) as Consulting Physician (Oncology) Megan Salon, MD as Consulting Physician (Gynecology) Laurence Ferrari, [place], MD as Consulting Physician (Dermatology)  CHIEF COMPLAINT: high risk family history for breast, uterine and skin cancer  CURRENT TREATMENT: Intensified screening  INTERVAL HISTORY:  Kirsten Wilson returns today for follow-up of her intensified screening program given her high risk of breast cancer. She tells me that she has actually had a rough time since she needed biopsies with both mammogram and MRI. She wonders if this means there could be something changing. She denies any breast changes herself. Rest of the health has been good. 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 She stays active, exercises regularly. Rest of the pertinent 10 point ROS reviewed and negative.   COVID 19 VACCINATION STATUS: s/p [place] x3, most recently 01/2020; infection 11/2020  HISTORY OF PRESENT ILLNESS: From the original intake note:  Kirsten Wilson moved to this in the area July 2015, from [place]. She had previously lived here. In [place] she was followed by oncology, dermatology, and gynecology for a family history strongly suggestive of a genetic mutation putting her at increased risk for melanoma, endometrial and breast cancers (her mother had all three cancers at an early age). She did meet with genetic counseling there and on 05/07/2010 was tested for the BRCA1 and BRCA2 genes, with no mutation detected. A broader panel or BART testing were not performed.  She now presents to our high Risk clinic for futher evaluation and follow-up   PAST MEDICAL HISTORY: Past Medical History:  Diagnosis Date  . Allergy   . Anxiety   .  Asthma   . Basal cell carcinoma 11/05/2017   Left upper back. Nodular pattern. Excised  . Depression   . Family history of malignant neoplasm of breast   . Family history of uterine cancer   . Migraine   . UTI (lower urinary tract infection)     PAST SURGICAL HISTORY: Past Surgical History:  Procedure Laterality Date  . BREAST BIOPSY  2011   benign  . BREAST BIOPSY Right 2020  . BREAST EXCISIONAL BIOPSY Left   . RADIOACTIVE SEED GUIDED EXCISIONAL BREAST BIOPSY Left 08/27/2021   Procedure: LEFT BREAST SEED GUIDED EXCISIONAL BIOPSY;  Surgeon: Rolm Bookbinder, MD;  Location: [facility];  Service: General;  Laterality: Left;  GEN & LMA  . TONSILLECTOMY AND ADENOIDECTOMY  1993    FAMILY HISTORY Family History  Problem Relation Age of Onset  . Breast cancer Mother 51  . Cancer Mother        uterine @ 38, breast @ 51, and melanoma @ 24  . Hypertension Mother   . Asthma Mother   . Arrhythmia Mother   . Hyperlipidemia Father   . Hypertension Father   . Diabetes Father   . Heart block Father        had angioplasty  . Cancer Maternal Aunt        skin & colon polyps (#/type?)  . Cancer Maternal Uncle        skin  . Breast cancer Paternal Aunt 26  . Cancer Paternal Aunt 38       breast; currently 6  . Cancer Maternal Grandfather        lung;  smoker; deceased 19  . Cancer Paternal Grandmother 26       colon cancer; deceased 53  . Alcohol abuse Paternal Grandfather   . Migraines Other        paternal side   the patient's mother was diagnosed with cancer of the uterus at age 91, cancer of the breast at age 97, and melanoma at age 64. This may well represent a p53 mutation, but there are many other possibilities. Unfortunately the patient's mother, who is still living, refuses to be genetically tested. The patient's maternal grandfather had a history of brain and lung cancer (these apparently were 2 separate primaries). He was a smoker. He died at age 35. 70 of the  patient's mother's siblings had skin cancers, but not melanomas. On the paternal side the paternal grandmother was diagnosed with colon cancer at age 15 and a paternal aunt was diagnosed with breast cancer at age 74. The patient's father has a history of precancerous polyps.   GYNECOLOGIC HISTORY:  No LMP recorded. (Menstrual status: Oral contraceptives). Menarche age 20. The patient is GX P0. She understands that waiting after age 89 to carry a child essentially doubles the risk of breast cancer.   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 She is single, lives alone, with no pets    ADVANCED DIRECTIVES: Not in place. At her 04/26/2014 visit she was given the appropriate documents to complete and notarize at her discretion.   HEALTH MAINTENANCE: Social History   Tobacco Use  . Smoking status: Never  . Smokeless tobacco: Never  Vaping Use  . Vaping Use: Never used  Substance Use Topics  . Alcohol use: Yes    Alcohol/week: 4.0 standard drinks of alcohol    Types: 4 Standard drinks or equivalent per week    Comment: occ  . Drug use: No     Colonoscopy: Never  PAP: March 2015  Bone density: Never  Lipid panel:  Allergies  Allergen Reactions  . Codone [Hydrocodone]     N/v  . Elemental Sulfur     Swelling   . Penicillins     rash    Current Outpatient Medications  Medication Sig Dispense Refill  . albuterol (VENTOLIN HFA) 108 (90 Base) MCG/ACT inhaler Inhale 2 puffs into the lungs every 6 (six) hours as needed for wheezing or shortness of breath. 8 g 0  . busPIRone (BUSPAR) 15 MG tablet TAKE 1/2 TABLET(7.5 MG) BY MOUTH TWICE DAILY 90 tablet 3  . escitalopram (LEXAPRO) 20 MG tablet TAKE 1 TABLET(20 MG) BY MOUTH DAILY 30 tablet 3   . fluticasone (FLONASE) 50 MCG/ACT nasal spray SHAKE LIQUID AND USE 2 SPRAYS IN EACH NOSTRIL DAILY 16 g 2  . loratadine (CLARITIN) 10 MG tablet Take 10 mg by mouth daily.    . norethindrone-ethinyl estradiol-FE (AUROVELA FE 1/20) 1-20 MG-MCG tablet Take 1 tablet by mouth daily. 84 tablet 4  . OnabotulinumtoxinA (BOTOX IJ) Inject as directed. Reported on 02/23/2015    . SUMAtriptan (IMITREX) 100 MG tablet Take 100 mg by mouth every 2 (two) hours as needed for migraine or headache. Reported on 02/23/2015     No current facility-administered medications for this visit.    OBJECTIVE: White woman in no acute distress  Vitals:   12/27/21 1531  BP: 121/82  Pulse: 64  Resp: 16  Temp: 97.7 F (36.5 C)  SpO2: 100%      Body mass index is 28.26 kg/m.    ECOG FS:0 - Asymptomatic  Sclerae unicteric, EOMs intact Wearing a mask No cervical or supraclavicular adenopathy Lungs no rales or rhonchi Heart regular rate and rhythm Abd soft, nontender, positive bowel sounds MSK no focal spinal tenderness, no upper extremity lymphedema Neuro: nonfocal, well oriented, appropriate affect Breasts: No concerning masses today. Lower outer right breast mass has been reported by Dr Jana Hakim.No regional adenopathy.  LAB RESULTS:  CMP     Component Value Date/Time   NA 138 12/27/2021 1429   NA 144 08/31/2018 1347   NA 140 01/02/2017 1137   K 3.9 12/27/2021 1429   K 4.0 01/02/2017 1137   CL 105 12/27/2021 1429   CO2 28 12/27/2021 1429   CO2 26 01/02/2017 1137   GLUCOSE 101 (H) 12/27/2021 1429   GLUCOSE 94 01/02/2017 1137   BUN 9 12/27/2021 1429   BUN 8 08/31/2018 1347   BUN 10.8 01/02/2017 1137   CREATININE 0.77 12/27/2021 1429   CREATININE 0.8 01/02/2017 1137   CALCIUM 8.9 12/27/2021 1429   CALCIUM 9.2 01/02/2017 1137   PROT 6.7 12/27/2021 1429   PROT 6.2 08/31/2018 1347   PROT 6.6 01/02/2017 1137   ALBUMIN 4.2 12/27/2021 1429   ALBUMIN 4.3 08/31/2018 1347   ALBUMIN 3.9 01/02/2017 1137    AST 17 12/27/2021 1429   AST 16 01/02/2017 1137   ALT 13 12/27/2021 1429   ALT 10 01/02/2017 1137   ALKPHOS 47 12/27/2021 1429   ALKPHOS 50 01/02/2017 1137   BILITOT 0.3 12/27/2021 1429   BILITOT 0.40 01/02/2017 1137   GFRNONAA >60 12/27/2021 1429   GFRAA 123 08/31/2018 1347   GFRAA >60 01/06/2018 1457    INo results found for: "SPEP", "UPEP"  Lab Results  Component Value Date   WBC 7.2 12/27/2021   NEUTROABS 4.2 12/27/2021   HGB 14.3 12/27/2021   HCT 41.2 12/27/2021   MCV 88.4 12/27/2021   PLT 302 12/27/2021      Chemistry      Component Value Date/Time   NA 138 12/27/2021 1429   NA 144 08/31/2018 1347   NA 140 01/02/2017 1137   K 3.9 12/27/2021 1429   K 4.0 01/02/2017 1137   CL 105 12/27/2021 1429   CO2 28 12/27/2021 1429   CO2 26 01/02/2017 1137   BUN 9 12/27/2021 1429   BUN 8 08/31/2018 1347   BUN 10.8 01/02/2017 1137   CREATININE 0.77 12/27/2021 1429   CREATININE 0.8 01/02/2017 1137      Component Value Date/Time   CALCIUM 8.9 12/27/2021 1429   CALCIUM 9.2 01/02/2017 1137   ALKPHOS 47 12/27/2021 1429   ALKPHOS 50 01/02/2017 1137   AST 17 12/27/2021 1429   AST 16 01/02/2017 1137   ALT 13 12/27/2021 1429   ALT 10 01/02/2017 1137   BILITOT 0.3 12/27/2021 1429   BILITOT 0.40 01/02/2017 1137       No results found for: "LABCA2"  No components found for: "LABCA125"  No results for input(s): "INR" in the last 168 hours.  Urinalysis    Component Value Date/Time   BILIRUBINUR negative 06/14/2014 1630   PROTEINUR negative 06/14/2014 1630   UROBILINOGEN 0.2 06/14/2014 1630   NITRITE negative 06/14/2014 1630   LEUKOCYTESUR large (3+) 06/14/2014 1630    STUDIES: No results found.   ASSESSMENT: 41 y.o.  [place] woman with a high-risk family history for breast, uterine, melanoma and colon cancers; also with D density breasts  (a) biopsy of a mass in the upper outer right breast 09/30/2018 showed fibrocystic change with no evidence of  malignancy.  (1) genetics testing 05/27/2014 through the BRCA 1 and 2 testing 05/07/2010 negative (BARTs not sent)  (2) genetics testing 05/05/2014 through the [place] gene panel at [facility] showed no deleterious mutations in ATM, BARD1, BRCA1, BRCA2, BRIP1, CDH1, CHEK2, MRE11A, MUTYH, NBN, NF1, PALB2, PTEN, RAD50, RAD51C, RAD51D, and TP53  (3) intensified screening: Initiated June 2015  (a) yearly gynecology visits/ [facility]  (b) yearly mammography/yearly MRI, 6 months apart  (c) yearly dermatology screening/ [facility]  (d) ophthalmology exam/ [facility] (Q2 yr)   PLAN:  [place] continues with intensified screening.   Most recent MRI did notice the right breast lower outer mass, biopsy showed PASH.  No new breast changes. I ordered repeat breast MRI for 6 months.  *Total Encounter Time as defined by the Centers for Medicare and Medicaid Services includes, in addition to the face-to-face time of a patient visit (documented in the note above) non-face-to-face time: obtaining and reviewing outside history, ordering and reviewing medications, tests or procedures, care coordination (communications with other health care professionals or caregivers) and documentation in the medical record.

## 2021-12-27 NOTE — Progress Notes (Signed)
[facility]  Telephone:([phone]) [phone] Fax:([phone]) [phone]     ID: NGUYET MERCER DOB: 20-Mar-1980  MR#: [id number]  HUO#:[id number]  Patient Care Team: Abner Greenspan, MD as PCP - General (Family Medicine) Magrinat, Virgie Dad, MD (Inactive) as Consulting Physician (Oncology) Megan Salon, MD as Consulting Physician (Gynecology) Laurence Ferrari, [place], MD as Consulting Physician (Dermatology)  CHIEF COMPLAINT: high risk family history for breast, uterine and skin cancer  CURRENT TREATMENT: Intensified screening  INTERVAL HISTORY:  Ahnika returns today for follow-up of her intensified screening program given her high risk of breast cancer. She tells me that she has actually had a rough time since she needed biopsies with both mammogram and MRI. She wonders if this means there could be something changing. She denies any breast changes herself. Rest of the health has been good. 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 She stays active, exercises regularly. Rest of the pertinent 10 point ROS reviewed and negative.   COVID 19 VACCINATION STATUS: s/p [place] x3, most recently 01/2020; infection 11/2020  HISTORY OF PRESENT ILLNESS: From the original intake note:  Avamae moved to this in the area July 2015, from [place]. She had previously lived here. In [place] she was followed by oncology, dermatology, and gynecology for a family history strongly suggestive of a genetic mutation putting her at increased risk for melanoma, endometrial and breast cancers (her mother had all three cancers at an early age). She did meet with genetic counseling there and on 05/07/2010 was tested for the BRCA1 and BRCA2 genes, with no mutation detected. A broader panel or BART testing were not performed.  She now presents to our high Risk clinic for futher evaluation and follow-up   PAST MEDICAL HISTORY: Past Medical History:  Diagnosis Date   Allergy    Anxiety    Asthma     Basal cell carcinoma 11/05/2017   Left upper back. Nodular pattern. Excised   Depression    Family history of malignant neoplasm of breast    Family history of uterine cancer    Migraine    UTI (lower urinary tract infection)     PAST SURGICAL HISTORY: Past Surgical History:  Procedure Laterality Date   BREAST BIOPSY  2011   benign   BREAST BIOPSY Right 2020   BREAST EXCISIONAL BIOPSY Left    RADIOACTIVE SEED GUIDED EXCISIONAL BREAST BIOPSY Left 08/27/2021   Procedure: LEFT BREAST SEED GUIDED EXCISIONAL BIOPSY;  Surgeon: Rolm Bookbinder, MD;  Location: [facility];  Service: General;  Laterality: Left;  GEN & LMA   TONSILLECTOMY AND ADENOIDECTOMY  1993    FAMILY HISTORY Family History  Problem Relation Age of Onset   Breast cancer Mother 26   Cancer Mother        uterine @ 32, breast @ 26, and melanoma @ 78   Hypertension Mother    Asthma Mother    Arrhythmia Mother    Hyperlipidemia Father    Hypertension Father    Diabetes Father    Heart block Father        had angioplasty   Cancer Maternal Aunt        skin & colon polyps (#/type?)   Cancer Maternal Uncle        skin   Breast cancer Paternal Aunt 69   Cancer Paternal Aunt 38       breast; currently 17   Cancer Maternal Grandfather        lung;  smoker; deceased 45   Cancer Paternal Grandmother 8       colon cancer; deceased 31   Alcohol abuse Paternal Grandfather    Migraines Other        paternal side   the patient's mother was diagnosed with cancer of the uterus at age 86, cancer of the breast at age 52, and melanoma at age 73. This may well represent a p53 mutation, but there are many other possibilities. Unfortunately the patient's mother, who is still living, refuses to be genetically tested. The patient's maternal grandfather had a history of brain and lung cancer (these apparently were 2 separate primaries). He was a smoker. He died at age 50. 66 of the patient's mother's siblings had skin  cancers, but not melanomas. On the paternal side the paternal grandmother was diagnosed with colon cancer at age 45 and a paternal aunt was diagnosed with breast cancer at age 9. The patient's father has a history of precancerous polyps.   GYNECOLOGIC HISTORY:  No LMP recorded. (Menstrual status: Oral contraceptives). Menarche age 55. The patient is GX P0. She understands that waiting after age 44 to carry a child essentially doubles the risk of breast cancer.   SOCIAL HISTORY: (Updated November 2022) Zitlali works in the [employer], with children at risk, between kindergarten and fourth grade.  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 She is single, lives alone, with no pets    ADVANCED DIRECTIVES: Not in place. At her 04/26/2014 visit she was given the appropriate documents to complete and notarize at her discretion.   HEALTH MAINTENANCE: Social History   Tobacco Use   Smoking status: Never   Smokeless tobacco: Never  Vaping Use   Vaping Use: Never used  Substance Use Topics   Alcohol use: Yes    Alcohol/week: 4.0 standard drinks of alcohol    Types: 4 Standard drinks or equivalent per week    Comment: occ   Drug use: No     Colonoscopy: Never  PAP: March 2015  Bone density: Never  Lipid panel:  Allergies  Allergen Reactions   Codone [Hydrocodone]     N/v   Elemental Sulfur     Swelling    Penicillins     rash    Current Outpatient Medications  Medication Sig Dispense Refill   albuterol (VENTOLIN HFA) 108 (90 Base) MCG/ACT inhaler Inhale 2 puffs into the lungs every 6 (six) hours as needed for wheezing or shortness of breath. 8 g 0   busPIRone (BUSPAR) 15 MG tablet TAKE 1/2 TABLET(7.5 MG) BY MOUTH TWICE DAILY 90 tablet 3   escitalopram (LEXAPRO) 20 MG tablet TAKE 1 TABLET(20 MG) BY MOUTH DAILY 30 tablet 3   fluticasone (FLONASE) 50 MCG/ACT nasal spray  SHAKE LIQUID AND USE 2 SPRAYS IN EACH NOSTRIL DAILY 16 g 2   loratadine (CLARITIN) 10 MG tablet Take 10 mg by mouth daily.     norethindrone-ethinyl estradiol-FE (AUROVELA FE 1/20) 1-20 MG-MCG tablet Take 1 tablet by mouth daily. 84 tablet 4   OnabotulinumtoxinA (BOTOX IJ) Inject as directed. Reported on 02/23/2015     SUMAtriptan (IMITREX) 100 MG tablet Take 100 mg by mouth every 2 (two) hours as needed for migraine or headache. Reported on 02/23/2015     No current facility-administered medications for this visit.    OBJECTIVE: White woman in no acute distress  Vitals:   12/27/21 1531  BP: 121/82  Pulse: 64  Resp: 16  Temp: 97.7 F (36.5 C)  SpO2: 100%      Body mass index is 28.26 kg/m.    ECOG FS:0 - Asymptomatic  Sclerae unicteric, EOMs intact Wearing a mask No cervical or supraclavicular adenopathy Lungs no rales or rhonchi Heart regular rate and rhythm Abd soft, nontender, positive bowel sounds MSK no focal spinal tenderness, no upper extremity lymphedema Neuro: nonfocal, well oriented, appropriate affect Breasts: No concerning masses today. Lower outer right breast mass has been reported by Dr Jana Hakim.No regional adenopathy.  LAB RESULTS:  CMP     Component Value Date/Time   NA 138 12/27/2021 1429   NA 144 08/31/2018 1347   NA 140 01/02/2017 1137   K 3.9 12/27/2021 1429   K 4.0 01/02/2017 1137   CL 105 12/27/2021 1429   CO2 28 12/27/2021 1429   CO2 26 01/02/2017 1137   GLUCOSE 101 (H) 12/27/2021 1429   GLUCOSE 94 01/02/2017 1137   BUN 9 12/27/2021 1429   BUN 8 08/31/2018 1347   BUN 10.8 01/02/2017 1137   CREATININE 0.77 12/27/2021 1429   CREATININE 0.8 01/02/2017 1137   CALCIUM 8.9 12/27/2021 1429   CALCIUM 9.2 01/02/2017 1137   PROT 6.7 12/27/2021 1429   PROT 6.2 08/31/2018 1347   PROT 6.6 01/02/2017 1137   ALBUMIN 4.2 12/27/2021 1429   ALBUMIN 4.3 08/31/2018 1347   ALBUMIN 3.9 01/02/2017 1137   AST 17 12/27/2021 1429   AST 16 01/02/2017 1137    ALT 13 12/27/2021 1429   ALT 10 01/02/2017 1137   ALKPHOS 47 12/27/2021 1429   ALKPHOS 50 01/02/2017 1137   BILITOT 0.3 12/27/2021 1429   BILITOT 0.40 01/02/2017 1137   GFRNONAA >60 12/27/2021 1429   GFRAA 123 08/31/2018 1347   GFRAA >60 01/06/2018 1457    INo results found for: "SPEP", "UPEP"  Lab Results  Component Value Date   WBC 7.2 12/27/2021   NEUTROABS 4.2 12/27/2021   HGB 14.3 12/27/2021   HCT 41.2 12/27/2021   MCV 88.4 12/27/2021   PLT 302 12/27/2021      Chemistry      Component Value Date/Time   NA 138 12/27/2021 1429   NA 144 08/31/2018 1347   NA 140 01/02/2017 1137   K 3.9 12/27/2021 1429   K 4.0 01/02/2017 1137   CL 105 12/27/2021 1429   CO2 28 12/27/2021 1429   CO2 26 01/02/2017 1137   BUN 9 12/27/2021 1429   BUN 8 08/31/2018 1347   BUN 10.8 01/02/2017 1137   CREATININE 0.77 12/27/2021 1429   CREATININE 0.8 01/02/2017 1137      Component Value Date/Time   CALCIUM 8.9 12/27/2021 1429   CALCIUM 9.2 01/02/2017 1137   ALKPHOS 47 12/27/2021 1429   ALKPHOS 50 01/02/2017 1137   AST 17 12/27/2021 1429   AST 16 01/02/2017 1137   ALT 13 12/27/2021 1429   ALT 10 01/02/2017 1137   BILITOT 0.3 12/27/2021 1429   BILITOT 0.40 01/02/2017 1137       No results found for: "LABCA2"  No components found for: "LABCA125"  No results for input(s): "INR" in the last 168 hours.  Urinalysis    Component Value Date/Time   BILIRUBINUR negative 06/14/2014 1630   PROTEINUR negative 06/14/2014 1630   UROBILINOGEN 0.2 06/14/2014 1630   NITRITE negative 06/14/2014 1630   LEUKOCYTESUR large (3+) 06/14/2014 1630    STUDIES: No results found.   ASSESSMENT: 41 y.o.  [place] woman with a high-risk family history for breast, uterine, melanoma and colon cancers; also with D density breasts  (a) biopsy of a mass in the upper outer right breast 09/30/2018 showed fibrocystic change with no evidence of malignancy.  (1) genetics testing 05/27/2014 through the BRCA 1  and 2 testing 05/07/2010 negative (BARTs not sent)  (2) genetics testing 05/05/2014 through the [place] gene panel at [facility] showed no deleterious mutations in ATM, BARD1, BRCA1, BRCA2, BRIP1, CDH1, CHEK2, MRE11A, MUTYH, NBN, NF1, PALB2, PTEN, RAD50, RAD51C, RAD51D, and TP53  (3) intensified screening: Initiated June 2015  (a) yearly gynecology visits/ [facility]  (b) yearly mammography/yearly MRI, 6 months apart  (c) yearly dermatology screening/ [facility]  (d) ophthalmology exam/ [facility] (Q2 yr)   PLAN:  Kjerstin continues with intensified screening.   Most recent MRI did notice the right breast lower outer mass, biopsy showed PASH.  No new breast changes. I ordered repeat breast MRI for 6 months. I will see her back after the MRI.  RTC  sooner as needed.  Total time spent: 20 min  *Total Encounter Time as defined by the Centers for Medicare and Medicaid Services includes, in addition to the face-to-face time of a patient visit (documented in the note above) non-face-to-face time: obtaining and reviewing outside history, ordering and reviewing medications, tests or procedures, care coordination (communications with other health care professionals or caregivers) and documentation in the medical record.

## 2021-12-28 ENCOUNTER — Encounter: Payer: Self-pay | Admitting: Hematology and Oncology

## 2022-01-02 ENCOUNTER — Other Ambulatory Visit (HOSPITAL_COMMUNITY)
Admission: RE | Admit: 2022-01-02 | Discharge: 2022-01-02 | Disposition: A | Discharge status: Home / Self Care | Payer: BC Managed Care – PPO | Source: Ambulatory Visit | Attending: Obstetrics & Gynecology | Admitting: Obstetrics & Gynecology

## 2022-01-02 ENCOUNTER — Encounter (HOSPITAL_BASED_OUTPATIENT_CLINIC_OR_DEPARTMENT_OTHER): Payer: Self-pay | Admitting: Obstetrics & Gynecology

## 2022-01-02 ENCOUNTER — Ambulatory Visit (INDEPENDENT_AMBULATORY_CARE_PROVIDER_SITE_OTHER): Payer: BC Managed Care – PPO | Admitting: Obstetrics & Gynecology

## 2022-01-02 VITALS — BP 111/73 | HR 69 | Ht 62.0 in | Wt 157.4 lb

## 2022-01-02 DIAGNOSIS — G43009 Migraine without aura, not intractable, without status migrainosus: Secondary | ICD-10-CM

## 2022-01-02 DIAGNOSIS — Z8049 Family history of malignant neoplasm of other genital organs: Secondary | ICD-10-CM | POA: Diagnosis not present

## 2022-01-02 DIAGNOSIS — Z01419 Encounter for gynecological examination (general) (routine) without abnormal findings: Secondary | ICD-10-CM

## 2022-01-02 DIAGNOSIS — Z803 Family history of malignant neoplasm of breast: Secondary | ICD-10-CM

## 2022-01-02 DIAGNOSIS — Z124 Encounter for screening for malignant neoplasm of cervix: Secondary | ICD-10-CM | POA: Diagnosis present

## 2022-01-02 DIAGNOSIS — Z8659 Personal history of other mental and behavioral disorders: Secondary | ICD-10-CM

## 2022-01-02 MED ORDER — NORETHIN ACE-ETH ESTRAD-FE 1-20 MG-MCG PO TABS: 1.0000 | ORAL_TABLET | Freq: Every day | ORAL | 4 refills | Status: DC

## 2022-01-02 NOTE — Progress Notes (Signed)
41 y.o. G0P0000 Single White or Caucasian female here for annual exam. Doing well.  Having every six month imaging.  Just had another breast biopsy.  Will have follow up breast MRI in 6 months.  We discussed possibly stopping OCPs and considering an IUD.  Strong family hx of gyn related cancers.  Negative genetic testing.  Now seeing Dr. Chryl Heck  No LMP recorded. (Menstrual status: Oral contraceptives).          Sexually active: No.  The current method of family planning is OCP (estrogen/progesterone).    Exercising: no Smoker:  no  Health Maintenance: Pap:  12/13/2020 Negative MMG:  06/07/2021 Additional images needed Colonoscopy:  guidelines reviewed Screening Labs: 08/2018.  If not done with Dr. Glori Bickers next year, pt aware I will order   reports that she has never smoked. She has never used smokeless tobacco. She reports current alcohol use of about 4.0 standard drinks of alcohol per week. She reports that she does not use drugs.  Past Medical History:  Diagnosis Date   Allergy    Anxiety    Asthma    Basal cell carcinoma 11/05/2017   Left upper back. Nodular pattern. Excised   Depression    Family history of malignant neoplasm of breast    Family history of uterine cancer    Migraine    UTI (lower urinary tract infection)     Past Surgical History:  Procedure Laterality Date   BREAST BIOPSY  2011   benign   BREAST BIOPSY Right 2020   BREAST EXCISIONAL BIOPSY Left    RADIOACTIVE SEED GUIDED EXCISIONAL BREAST BIOPSY Left 08/27/2021   Procedure: LEFT BREAST SEED GUIDED EXCISIONAL BIOPSY;  Surgeon: Rolm Bookbinder, MD;  Location: [facility];  Service: General;  Laterality: Left;  [place]    Current Outpatient Medications  Medication Sig Dispense Refill   albuterol (VENTOLIN HFA) 108 (90 Base) MCG/ACT inhaler Inhale 2 puffs into the lungs every 6 (six) hours as needed for wheezing or shortness of breath. 8 g 0    busPIRone (BUSPAR) 15 MG tablet TAKE 1/2 TABLET(7.5 MG) BY MOUTH TWICE DAILY 90 tablet 3   escitalopram (LEXAPRO) 20 MG tablet TAKE 1 TABLET(20 MG) BY MOUTH DAILY 30 tablet 3   fluticasone (FLONASE) 50 MCG/ACT nasal spray SHAKE LIQUID AND USE 2 SPRAYS IN EACH NOSTRIL DAILY 16 g 2   loratadine (CLARITIN) 10 MG tablet Take 10 mg by mouth daily.     OnabotulinumtoxinA (BOTOX IJ) Inject as directed. Reported on 02/23/2015     SUMAtriptan (IMITREX) 100 MG tablet Take 100 mg by mouth every 2 (two) hours as needed for migraine or headache. Reported on 02/23/2015     norethindrone-ethinyl estradiol-FE (AUROVELA FE 1/20) 1-20 MG-MCG tablet Take 1 tablet by mouth daily. 84 tablet 4   No current facility-administered medications for this visit.    Family History  Problem Relation Age of Onset   Breast cancer Mother 39   Cancer Mother        uterine @ 23, breast @ 68, and melanoma @ 43   Hypertension Mother    Asthma Mother    Arrhythmia Mother    Hyperlipidemia Father    Hypertension Father    Diabetes Father    Heart block Father        had angioplasty   Cancer Maternal Aunt        skin & colon polyps (#/type?)  Cancer Maternal Uncle        skin   Breast cancer Paternal Aunt 33   Cancer Paternal Aunt 63       breast; currently 52   Cancer Maternal Grandfather        lung; smoker; deceased 8   Cancer Paternal Grandmother 47       colon cancer; deceased 33   Alcohol abuse Paternal Grandfather    Migraines Other        paternal side    ROS: Constitutional: negative Genitourinary:negative  Exam:   BP 111/73 (BP Location: Right Arm, Patient Position: Sitting, Cuff Size: Large)   Pulse 69   Ht '5\' 2"'$  (1.575 m) Comment: Reported  Wt 157 lb 6.4 oz (71.4 kg)   BMI 28.79 kg/m   Height: '5\' 2"'$  (157.5 cm) (Reported)  General appearance: alert, cooperative and appears stated age Head: Normocephalic, without obvious abnormality, atraumatic Neck: no adenopathy, supple, symmetrical,  trachea midline and thyroid normal to inspection and palpation Lungs: clear to auscultation bilaterally Breasts: normal appearance, no masses or tenderness Heart: regular rate and rhythm Abdomen: soft, non-tender; bowel sounds normal; no masses,  no organomegaly Extremities: extremities normal, atraumatic, no cyanosis or edema Skin: Skin color, texture, turgor normal. No rashes or lesions Lymph nodes: Cervical, supraclavicular, and axillary nodes normal. No abnormal inguinal nodes palpated Neurologic: Grossly normal   Pelvic: External genitalia:  no lesions              Urethra:  normal appearing urethra with no masses, tenderness or lesions              Bartholins and Skenes: normal                 Vagina: normal appearing vagina with normal color and no discharge, no lesions              Cervix: no lesions              Pap taken: Yes.   Bimanual Exam:  Uterus:  normal size, contour, position, consistency, mobility, non-tender              Adnexa: normal adnexa and no mass, fullness, tenderness               Rectovaginal: Confirms               Anus:  normal sphincter tone, no lesions  Chaperone, Octaviano Batty, CMA, was present for exam.  Assessment/Plan: 1. Well woman exam with routine gynecological exam - Pap smear and HR HPV obtained today - Mammogram will be done about a year from now - Colonoscopy guidelines reviewed - lab work done done with PCP - vaccines reviewed/updated  2. Cervical cancer screening - Cytology - PAP( [facility])  3. Family history of uterine cancer - norethindrone-ethinyl estradiol-FE (AUROVELA FE 1/20) 1-20 MG-MCG tablet; Take 1 tablet by mouth daily.  Dispense: 84 tablet; Refill: 4  4. Family history of breast cancer  5. History of depression - on medication followed by Dr. Glori Bickers  6. Migraine without aura and without status migrainosus, not intractable - receiving Botox and doing wellw with this

## 2022-01-04 LAB — CYTOLOGY - PAP
Adequacy: ABSENT
Comment: NEGATIVE
Diagnosis: NEGATIVE
High risk HPV: NEGATIVE

## 2022-02-15 ENCOUNTER — Other Ambulatory Visit (HOSPITAL_BASED_OUTPATIENT_CLINIC_OR_DEPARTMENT_OTHER): Payer: Self-pay | Admitting: Obstetrics & Gynecology

## 2022-02-15 DIAGNOSIS — Z8049 Family history of malignant neoplasm of other genital organs: Secondary | ICD-10-CM

## 2022-04-04 ENCOUNTER — Encounter: Payer: BC Managed Care – PPO | Admitting: Dermatology

## 2022-05-04 ENCOUNTER — Encounter: Payer: Self-pay | Admitting: Hematology and Oncology

## 2022-05-08 ENCOUNTER — Other Ambulatory Visit: Payer: Self-pay | Admitting: *Deleted

## 2022-05-08 ENCOUNTER — Telehealth: Payer: Self-pay | Admitting: Adult Health

## 2022-05-08 NOTE — Telephone Encounter (Signed)
Received a staff message on 2/27 that the initial request for the patient's breast MRI was denied by insurance and peer to peer was required.  Denial reason not listed, along with most recent MRI results.    Patient underwent breast MRI on 11/13/2021 that demonstrated new lower outer right breast non-mass enhancement and biopsy was recommended.  She underwent breast biopsy that was negative for carcinoma and repeat MRI was recommended to occur in 6 months time.    I reached out to conduct a peer to peer as requested and learned that the initial case was closed on 05/01/2021 and that a provider courtesy review must be completed.  I was transferred to the appeals team and spoke with [facility].    Melissa noted that on 3/4 they received a call from Hinda Lenis who initiated the appeal process and that they authorized the breast MRI to be completed at [facility].  Auth # [id number], valid for 30 days beginning May 08, 2022.    I will forward this message to the care team, as the orders will need to be changed if the faxed authorization to our prior authorization team is indeed [facility] and not [facility].  Time spent: 30 minutes  Wilber Bihari, NP 05/08/22 2:09 PM Medical Oncology and Hematology [facility] [place], [place] [address] Tel. [phone]    Fax. [phone]

## 2022-05-09 ENCOUNTER — Ambulatory Visit
Admission: RE | Admit: 2022-05-09 | Discharge: 2022-05-09 | Disposition: A | Discharge status: Home / Self Care | Payer: BC Managed Care – PPO | Source: Ambulatory Visit | Attending: Hematology and Oncology | Admitting: Hematology and Oncology

## 2022-05-09 DIAGNOSIS — Z1239 Encounter for other screening for malignant neoplasm of breast: Secondary | ICD-10-CM

## 2022-05-09 MED ORDER — GADOPICLENOL 0.5 MMOL/ML IV SOLN
7.0000 mL | Freq: Once | INTRAVENOUS | Status: AC | PRN
  Administered 2022-05-09: 7 mL via INTRAVENOUS

## 2022-05-10 ENCOUNTER — Telehealth: Payer: Self-pay

## 2022-05-10 NOTE — Telephone Encounter (Signed)
Called and given message below. Patient verbalized understanding.

## 2022-05-10 NOTE — Telephone Encounter (Signed)
-----   Message from Gardenia Phlegm, NP sent at 05/10/2022 12:57 PM EST ----- Please let patient know that her MRI of the breast looks good.  No sign of enhancement or concerning issues in either breast. ----- Message ----- From: Interface, Rad Results In Sent: 05/10/2022  11:22 AM EST To: Benay Pike, MD

## 2022-06-06 ENCOUNTER — Encounter: Payer: BC Managed Care – PPO | Admitting: Dermatology

## 2022-06-16 ENCOUNTER — Other Ambulatory Visit: Payer: Self-pay | Admitting: Family Medicine

## 2022-06-17 NOTE — Telephone Encounter (Signed)
Pt is due for her CPE (labs prior) on or after 06/22/22, please schedule and then route back to me

## 2022-06-17 NOTE — Telephone Encounter (Signed)
LVM for patient tcb and schedule 

## 2022-06-18 NOTE — Telephone Encounter (Signed)
Spoke to pt, scheduled cpe for 07/12/22 

## 2022-06-27 ENCOUNTER — Other Ambulatory Visit: Payer: Self-pay

## 2022-06-27 ENCOUNTER — Inpatient Hospital Stay: Payer: BC Managed Care – PPO | Attending: Hematology and Oncology | Admitting: Hematology and Oncology

## 2022-06-27 VITALS — BP 124/84 | HR 60 | Temp 97.9°F | Resp 16 | Ht 62.0 in | Wt 162.8 lb

## 2022-06-27 DIAGNOSIS — Z801 Family history of malignant neoplasm of trachea, bronchus and lung: Secondary | ICD-10-CM | POA: Diagnosis not present

## 2022-06-27 DIAGNOSIS — Z85828 Personal history of other malignant neoplasm of skin: Secondary | ICD-10-CM | POA: Insufficient documentation

## 2022-06-27 DIAGNOSIS — Z800 Family history of malignant neoplasm of digestive organs: Secondary | ICD-10-CM | POA: Diagnosis not present

## 2022-06-27 DIAGNOSIS — Z808 Family history of malignant neoplasm of other organs or systems: Secondary | ICD-10-CM | POA: Diagnosis not present

## 2022-06-27 DIAGNOSIS — Z1239 Encounter for other screening for malignant neoplasm of breast: Secondary | ICD-10-CM

## 2022-06-27 DIAGNOSIS — Z803 Family history of malignant neoplasm of breast: Secondary | ICD-10-CM | POA: Diagnosis present

## 2022-06-27 NOTE — Progress Notes (Signed)
[facility]  Telephone:(336) [phone] Fax:(336) [phone]     ID: BALERIA WYMAN DOB: March 23, 1980  MR#: [id number]  EXB#:[id number]  Patient Care Team: Judy Pimple, MD as PCP - General (Family Medicine) Jerene Bears, MD as Consulting Physician (Gynecology) Neale Burly, [place], MD as Consulting Physician (Dermatology) Rachel Moulds, MD as Consulting Physician (Hematology and Oncology)  CHIEF COMPLAINT: high risk family history for breast, uterine and skin cancer  CURRENT TREATMENT: Intensified screening  INTERVAL HISTORY:  Micha returns today for follow-up. She denies any new health complaints.  Since her last visit here she had an MRI which did not show any concerns for malignancy.  She currently continues on birth control for contraception as well as menstrual regulation but was hoping to discontinue it.  She also suffers from migraines which are well-controlled with Botox.  Rest of the pertinent 10 point ROS reviewed and negative   COVID 19 VACCINATION STATUS: s/p [place] x3, most recently 01/2020; infection 11/2020  HISTORY OF PRESENT ILLNESS: From the original intake note:  Brezlyn moved to this in the area July 2015, from [place]. She had previously lived here. In [place] she was followed by oncology, dermatology, and gynecology for a family history strongly suggestive of a genetic mutation putting her at increased risk for melanoma, endometrial and breast cancers (her mother had all three cancers at an early age). She did meet with genetic counseling there and on 05/07/2010 was tested for the BRCA1 and BRCA2 genes, with no mutation detected. A broader panel or BART testing were not performed.  She now presents to our high Risk clinic for futher evaluation and follow-up   PAST MEDICAL HISTORY: Past Medical History:  Diagnosis Date   Allergy    Anxiety    Asthma    Basal cell carcinoma 11/05/2017   Left upper back. Nodular pattern. Excised    Depression    Family history of malignant neoplasm of breast    Family history of uterine cancer    Migraine    UTI (lower urinary tract infection)     PAST SURGICAL HISTORY: Past Surgical History:  Procedure Laterality Date   BREAST BIOPSY  2011   benign   BREAST BIOPSY Right 2020   BREAST EXCISIONAL BIOPSY Left    RADIOACTIVE SEED GUIDED EXCISIONAL BREAST BIOPSY Left 08/27/2021   Procedure: LEFT BREAST SEED GUIDED EXCISIONAL BIOPSY;  Surgeon: Emelia Loron, MD;  Location: [facility];  Service: General;  Laterality: Left;  GEN & LMA   TONSILLECTOMY AND ADENOIDECTOMY  1993    FAMILY HISTORY Family History  Problem Relation Age of Onset   Breast cancer Mother 42   Cancer Mother        uterine @ 81, breast @ 45, and melanoma @ 40   Hypertension Mother    Asthma Mother    Arrhythmia Mother    Hyperlipidemia Father    Hypertension Father    Diabetes Father    Heart block Father        had angioplasty   Cancer Maternal Aunt        skin & colon polyps (#/type?)   Cancer Maternal Uncle        skin   Breast cancer Paternal Aunt 72   Cancer Paternal Aunt 73       breast; currently 70   Cancer Maternal Grandfather        lung; smoker; deceased 25   Cancer Paternal Grandmother 69  colon cancer; deceased 61   Alcohol abuse Paternal Grandfather    Migraines Other        paternal side   the patient's mother was diagnosed with cancer of the uterus at age 42, cancer of the breast at age 36, and melanoma at age 71. This may well represent a p53 mutation, but there are many other possibilities. Unfortunately the patient's mother, who is still living, refuses to be genetically tested. The patient's maternal grandfather had a history of brain and lung cancer (these apparently were 2 separate primaries). He was a smoker. He died at age 64. 2 of the patient's mother's siblings had skin cancers, but not melanomas. On the paternal side the paternal grandmother was  diagnosed with colon cancer at age 55 and a paternal aunt was diagnosed with breast cancer at age 65. The patient's father has a history of precancerous polyps.   GYNECOLOGIC HISTORY:  No LMP recorded. (Menstrual status: Oral contraceptives). Menarche age 60. The patient is GX P0. She understands that waiting after age 62 to carry a child essentially doubles the risk of breast cancer.   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 She is single, lives alone, with no pets    ADVANCED DIRECTIVES: Not in place. At her 04/26/2014 visit she was given the appropriate documents to complete and notarize at her discretion.   HEALTH MAINTENANCE: Social History   Tobacco Use   Smoking status: Never   Smokeless tobacco: Never  Vaping Use   Vaping Use: Never used  Substance Use Topics   Alcohol use: Yes    Alcohol/week: 4.0 standard drinks of alcohol    Types: 4 Standard drinks or equivalent per week    Comment: occ   Drug use: No     Colonoscopy: Never  PAP: March 2015  Bone density: Never  Lipid panel:  Allergies  Allergen Reactions   Codone [Hydrocodone]     N/v   Elemental Sulfur     Swelling    Penicillins     rash    Current Outpatient Medications  Medication Sig Dispense Refill   albuterol (VENTOLIN HFA) 108 (90 Base) MCG/ACT inhaler Inhale 2 puffs into the lungs every 6 (six) hours as needed for wheezing or shortness of breath. 8 g 0   BLISOVI FE 1/20 1-20 MG-MCG tablet TAKE 1 TABLET BY MOUTH DAILY 84 tablet 4   busPIRone (BUSPAR) 15 MG tablet TAKE 1/2 TABLET(7.5 MG) BY MOUTH TWICE DAILY 90 tablet 3   escitalopram (LEXAPRO) 20 MG tablet TAKE 1 TABLET(20 MG) BY MOUTH DAILY 90 tablet 0   fluticasone (FLONASE) 50 MCG/ACT nasal spray  SHAKE LIQUID AND USE 2 SPRAYS IN EACH NOSTRIL DAILY 16 g 2   loratadine (CLARITIN) 10 MG tablet Take 10 mg by mouth daily.     OnabotulinumtoxinA (BOTOX IJ) Inject as directed. Reported on 02/23/2015     SUMAtriptan (IMITREX) 100 MG tablet Take 100 mg by mouth every 2 (two) hours as needed for migraine or headache. Reported on 02/23/2015     No current facility-administered medications for this visit.    OBJECTIVE: White woman in no acute distress  Vitals:   06/27/22 1508  BP: 124/84  Pulse: 60  Resp: 16  Temp: 97.9 F (36.6 C)  SpO2: 100%  Body mass index is 29.78 kg/m.    ECOG FS:0 - Asymptomatic  Sclerae unicteric, EOMs intact Wearing a mask No cervical or supraclavicular adenopathy Lungs no rales or rhonchi Heart regular rate and rhythm Abd soft, nontender, positive bowel sounds MSK no focal spinal tenderness, no upper extremity lymphedema Neuro: nonfocal, well oriented, appropriate affect Breasts: No concerning masses today.  No regional adenopathy.  Scattered density in the breast.  LAB RESULTS:  CMP     Component Value Date/Time   NA 138 12/27/2021 1429   NA 144 08/31/2018 1347   NA 140 01/02/2017 1137   K 3.9 12/27/2021 1429   K 4.0 01/02/2017 1137   CL 105 12/27/2021 1429   CO2 28 12/27/2021 1429   CO2 26 01/02/2017 1137   GLUCOSE 101 (H) 12/27/2021 1429   GLUCOSE 94 01/02/2017 1137   BUN 9 12/27/2021 1429   BUN 8 08/31/2018 1347   BUN 10.8 01/02/2017 1137   CREATININE 0.77 12/27/2021 1429   CREATININE 0.8 01/02/2017 1137   CALCIUM 8.9 12/27/2021 1429   CALCIUM 9.2 01/02/2017 1137   PROT 6.7 12/27/2021 1429   PROT 6.2 08/31/2018 1347   PROT 6.6 01/02/2017 1137   ALBUMIN 4.2 12/27/2021 1429   ALBUMIN 4.3 08/31/2018 1347   ALBUMIN 3.9 01/02/2017 1137   AST 17 12/27/2021 1429   AST 16 01/02/2017 1137   ALT 13 12/27/2021 1429   ALT 10 01/02/2017 1137   ALKPHOS 47 12/27/2021 1429   ALKPHOS 50 01/02/2017 1137   BILITOT 0.3 12/27/2021 1429    BILITOT 0.40 01/02/2017 1137   GFRNONAA >60 12/27/2021 1429   GFRAA 123 08/31/2018 1347   GFRAA >60 01/06/2018 1457    INo results found for: "SPEP", "UPEP"  Lab Results  Component Value Date   WBC 7.2 12/27/2021   NEUTROABS 4.2 12/27/2021   HGB 14.3 12/27/2021   HCT 41.2 12/27/2021   MCV 88.4 12/27/2021   PLT 302 12/27/2021      Chemistry      Component Value Date/Time   NA 138 12/27/2021 1429   NA 144 08/31/2018 1347   NA 140 01/02/2017 1137   K 3.9 12/27/2021 1429   K 4.0 01/02/2017 1137   CL 105 12/27/2021 1429   CO2 28 12/27/2021 1429   CO2 26 01/02/2017 1137   BUN 9 12/27/2021 1429   BUN 8 08/31/2018 1347   BUN 10.8 01/02/2017 1137   CREATININE 0.77 12/27/2021 1429   CREATININE 0.8 01/02/2017 1137      Component Value Date/Time   CALCIUM 8.9 12/27/2021 1429   CALCIUM 9.2 01/02/2017 1137   ALKPHOS 47 12/27/2021 1429   ALKPHOS 50 01/02/2017 1137   AST 17 12/27/2021 1429   AST 16 01/02/2017 1137   ALT 13 12/27/2021 1429   ALT 10 01/02/2017 1137   BILITOT 0.3 12/27/2021 1429   BILITOT 0.40 01/02/2017 1137       No results found for: "LABCA2"  No components found for: "LABCA125"  No results for input(s): "INR" in the last 168 hours.  Urinalysis    Component Value Date/Time   BILIRUBINUR negative 06/14/2014 1630   PROTEINUR negative 06/14/2014 1630   UROBILINOGEN 0.2 06/14/2014 1630   NITRITE negative 06/14/2014 1630   LEUKOCYTESUR large (3+) 06/14/2014 1630    STUDIES: No results found.   ASSESSMENT: 42 y.o. [place] woman with a high-risk family history for breast, uterine, melanoma and colon cancers; also with D density breasts  (a) biopsy of a mass  in the upper outer right breast 09/30/2018 showed fibrocystic change with no evidence of malignancy.  (1) genetics testing 05/27/2014 through the BRCA 1 and 2 testing 05/07/2010 negative (BARTs not sent)  (2) genetics testing 05/05/2014 through the OvaNext gene panel at [facility] showed no  deleterious mutations in ATM, BARD1, BRCA1, BRCA2, BRIP1, CDH1, CHEK2, MRE11A, MUTYH, NBN, NF1, PALB2, PTEN, RAD50, RAD51C, RAD51D, and TP53  (3) intensified screening: Initiated June 2015  (a) yearly gynecology visits/ [facility]  (b) yearly mammography/yearly MRI, 6 months apart  (c) yearly dermatology screening/ [place]  (d) ophthalmology exam/ [facility] (Q2 yr)   PLAN:  Latresa continues with intensified screening.   She most recently had MRI breast which did not show any evidence of malignancy.  No concerns on breast exam today.  Will try to change the mammogram to fall and will consider repeating MRI every spring. She is okay with this plan.  She was of course encouraged to continue self breast exam once a month and report any changes to [place].  Total time spent: 20 min  *Total Encounter Time as defined by the Centers for Medicare and Medicaid Services includes, in addition to the face-to-face time of a patient visit (documented in the note above) non-face-to-face time: obtaining and reviewing outside history, ordering and reviewing medications, tests or procedures, care coordination (communications with other health care professionals or caregivers) and documentation in the medical record.

## 2022-07-01 ENCOUNTER — Other Ambulatory Visit: Payer: Self-pay | Admitting: Family Medicine

## 2022-07-03 ENCOUNTER — Telehealth: Payer: Self-pay | Admitting: Family Medicine

## 2022-07-03 DIAGNOSIS — Z0000 Encounter for general adult medical examination without abnormal findings: Secondary | ICD-10-CM

## 2022-07-03 NOTE — Telephone Encounter (Signed)
-----   Message from Alvina Chou sent at 06/18/2022 11:33 AM EDT ----- Regarding: Lab orders for Friday, 5.3.24 Patient is scheduled for CPX labs, please order future labs, Thanks , Camelia Eng

## 2022-07-05 ENCOUNTER — Other Ambulatory Visit (INDEPENDENT_AMBULATORY_CARE_PROVIDER_SITE_OTHER): Payer: BC Managed Care – PPO

## 2022-07-05 DIAGNOSIS — Z0000 Encounter for general adult medical examination without abnormal findings: Secondary | ICD-10-CM

## 2022-07-05 NOTE — Addendum Note (Signed)
Addended by: Philemon Riedesel P on: 07/05/2022 03:48 PM   Modules accepted: Orders  

## 2022-07-05 NOTE — Addendum Note (Signed)
Addended by: Brodie Correll P on: 07/05/2022 03:48 PM   Modules accepted: Orders  

## 2022-07-05 NOTE — Addendum Note (Signed)
Addended by: Eual Fines on: 07/05/2022 03:48 PM   Modules accepted: Orders

## 2022-07-05 NOTE — Addendum Note (Signed)
Addended by: Daya Dutt P on: 07/05/2022 03:48 PM   Modules accepted: Orders  

## 2022-07-06 LAB — CBC WITH DIFFERENTIAL/PLATELET
Absolute Monocytes: 490 cells/uL (ref 200–950)
Basophils Absolute: 52 cells/uL (ref 0–200)
Basophils Relative: 0.6 %
Eosinophils Absolute: 43 cells/uL (ref 15–500)
Eosinophils Relative: 0.5 %
HCT: 38.7 % (ref 35.0–45.0)
Hemoglobin: 13.4 g/dL (ref 11.7–15.5)
Lymphs Abs: 2804 cells/uL (ref 850–3900)
MCH: 29.9 pg (ref 27.0–33.0)
MCHC: 34.6 g/dL (ref 32.0–36.0)
MCV: 86.4 fL (ref 80.0–100.0)
MPV: 11.1 fL (ref 7.5–12.5)
Monocytes Relative: 5.7 %
Neutro Abs: 5212 cells/uL (ref 1500–7800)
Neutrophils Relative %: 60.6 %
Platelets: 247 10*3/uL (ref 140–400)
RBC: 4.48 10*6/uL (ref 3.80–5.10)
RDW: 12.1 % (ref 11.0–15.0)
Total Lymphocyte: 32.6 %
WBC: 8.6 10*3/uL (ref 3.8–10.8)

## 2022-07-06 LAB — COMPREHENSIVE METABOLIC PANEL
AG Ratio: 1.9 (calc) (ref 1.0–2.5)
ALT: 10 U/L (ref 6–29)
AST: 16 U/L (ref 10–30)
Albumin: 4.2 g/dL (ref 3.6–5.1)
Alkaline phosphatase (APISO): 43 U/L (ref 31–125)
BUN: 11 mg/dL (ref 7–25)
CO2: 23 mmol/L (ref 20–32)
Calcium: 8.9 mg/dL (ref 8.6–10.2)
Chloride: 103 mmol/L (ref 98–110)
Creat: 0.78 mg/dL (ref 0.50–0.99)
Globulin: 2.2 g/dL (calc) (ref 1.9–3.7)
Glucose, Bld: 81 mg/dL (ref 65–99)
Potassium: 3.8 mmol/L (ref 3.5–5.3)
Sodium: 138 mmol/L (ref 135–146)
Total Bilirubin: 0.5 mg/dL (ref 0.2–1.2)
Total Protein: 6.4 g/dL (ref 6.1–8.1)

## 2022-07-06 LAB — LIPID PANEL
Cholesterol: 143 mg/dL (ref <200)
HDL: 52 mg/dL (ref >=50)
LDL Cholesterol (Calc): 77 mg/dL (calc)
Non-HDL Cholesterol (Calc): 91 mg/dL (calc) (ref <130)
Total CHOL/HDL Ratio: 2.8 (calc) (ref <5.0)
Triglycerides: 60 mg/dL (ref <150)

## 2022-07-06 LAB — TSH: TSH: 1.4 mIU/L

## 2022-07-12 ENCOUNTER — Encounter: Payer: Self-pay | Admitting: Family Medicine

## 2022-07-12 ENCOUNTER — Ambulatory Visit (INDEPENDENT_AMBULATORY_CARE_PROVIDER_SITE_OTHER): Payer: BC Managed Care – PPO | Admitting: Family Medicine

## 2022-07-12 VITALS — BP 110/72 | HR 85 | Temp 97.6°F | Ht 62.75 in | Wt 162.2 lb

## 2022-07-12 DIAGNOSIS — Z803 Family history of malignant neoplasm of breast: Secondary | ICD-10-CM | POA: Diagnosis not present

## 2022-07-12 DIAGNOSIS — F411 Generalized anxiety disorder: Secondary | ICD-10-CM | POA: Diagnosis not present

## 2022-07-12 DIAGNOSIS — Z1239 Encounter for other screening for malignant neoplasm of breast: Secondary | ICD-10-CM | POA: Diagnosis not present

## 2022-07-12 DIAGNOSIS — Z0000 Encounter for general adult medical examination without abnormal findings: Secondary | ICD-10-CM | POA: Diagnosis not present

## 2022-07-12 NOTE — Patient Instructions (Addendum)
Add some strength training to your routine, this is important for bone and brain health and can reduce your risk of falls and help your body use insulin properly and regulate weight  Light weights, exercise bands , and internet videos are a good way to start  Yoga (chair or regular), machines , floor exercises or a gym with machines are also good options   Do your cardio also   Keep eating a healthy low glycemic diet  Try to get most of your carbohydrates from produce (with the exception of white potatoes)  Eat less bread/pasta/rice/snack foods/cereals/sweets and other items from the middle of the grocery store (processed carbs)  Wear sun protection   Labs look good

## 2022-07-12 NOTE — Progress Notes (Unsigned)
Subjective:    Patient ID: Kirsten Wilson, female    DOB: 11-Oct-1980, 42 y.o.   MRN: [id number]  HPI Here for health maintenance exam and to review chronic medical problems    Wt Readings from Last 3 Encounters:  07/12/22 162 lb 4 oz (73.6 kg)  06/27/22 162 lb 12.8 oz (73.8 kg)  01/02/22 157 lb 6.4 oz (71.4 kg)   28.97 kg/m  Immunization History  Administered Date(s) Administered   Influenza,inj,Quad PF,6+ Mos 03/30/2014, 01/07/2017, 12/24/2017, 11/23/2018, 11/23/2019, 12/13/2020   Influenza-Unspecified 12/30/2021   Moderna Covid-19 Vaccine Bivalent Booster 58yrs & up 12/23/2021   Moderna Sars-Covid-2 Vaccination 05/01/2019, 05/29/2019, 01/22/2020   PPD Test 10/31/2021   Tdap 06/14/2014   Health Maintenance Due  Topic Date Due   HIV Screening  Never done   Hepatitis C Screening  Never done    Feeling good  Taking care of herself  Exercise- did a 5 k 2 weeks ago/likes to run    It has been a tough year in regards to cancer screening  Mother had breast cancer at age 53    (also uterine cancer and melanoma)  Mammogram 05/2022  -had biopsy Report: IMPRESSION: 1. Expected post biopsy changes in the LOWER OUTER QUADRANT of the RIGHT breast. There is no residual or suspicious enhancement in the RIGHT breast. 2. LEFT breast is negative.   RECOMMENDATION: Recommend screening mammogram in April 2024.   Based on the recommendations of the American Cancer Society, annual screening MRI is suggested in addition to annual mammography if the patient has an estimated lifetime risk of developing breast cancer which is greater than 20%.   BI-RADS CATEGORY  2: Benign. 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[place])   Sleeping bettter  H/o GAD Takes lexapro 20 mg daily       01/02/2022    1:47 PM 12/13/2020    2:38 PM 06/09/2020    4:06 PM 06/08/2019   11:24 AM 06/06/2017    3:49 PM  Depression screen PHQ 2/9  Decreased Interest 0 0 0 0 1  Down, Depressed, Hopeless 0 0 0 0 2  PHQ - 2 Score 0 0 0 0 3  Altered sleeping   2 2 3   Tired, decreased energy   2 1 3   Change in appetite   0 0 2  Feeling bad or failure about yourself    0 0 1  Trouble concentrating   0 0 3  Moving slowly or fidgety/restless   0 1 3  Suicidal thoughts   0 0 0  PHQ-9 Score   4 4 18     Cholesterol  Lab Results  Component Value Date   CHOL 143 07/05/2022   CHOL 138 08/31/2018   CHOL 164 06/09/2014   Lab Results  Component Value Date   HDL 52 07/05/2022   HDL 51 08/31/2018   HDL 45.50 06/09/2014   Lab Results  Component Value Date   LDLCALC 77 07/05/2022   LDLCALC 63 08/31/2018   LDLCALC 101 (H) 06/09/2014   Lab Results  Component Value Date   TRIG 60 07/05/2022   TRIG 119 08/31/2018   TRIG 87.0 06/09/2014   Lab Results  Component Value Date   CHOLHDL 2.8 07/05/2022  CHOLHDL 2.7 08/31/2018   CHOLHDL 4 06/09/2014   No results found for: "LDLDIRECT"   Last metabolic panel Lab Results  Component Value Date   GLUCOSE 81 07/05/2022   NA 138 07/05/2022   K 3.8 07/05/2022   CL 103 07/05/2022   CO2 23 07/05/2022   BUN 11 07/05/2022   CREATININE 0.78 07/05/2022   GFRNONAA >60 12/27/2021   CALCIUM 8.9 07/05/2022   PROT 6.4 07/05/2022   ALBUMIN 4.2 12/27/2021   LABGLOB 1.9 08/31/2018   AGRATIO 2.3 (H) 08/31/2018   BILITOT 0.5 07/05/2022   ALKPHOS 47 12/27/2021   AST 16 07/05/2022   ALT 10 07/05/2022   ANIONGAP 5 12/27/2021   Lab Results  Component Value Date   TSH 1.40 07/05/2022   Lab Results  Component Value Date   WBC 8.6 07/05/2022   HGB 13.4 07/05/2022   HCT 38.7 07/05/2022   MCV 86.4 07/05/2022   PLT 247 07/05/2022   Patient Active Problem List   Diagnosis Date Noted   Generalized anxiety disorder 06/06/2017   [place] uterus 09/03/2015    Cervical polyp 08/26/2014   Routine general medical examination at a health care facility 06/08/2014   Genetic testing 05/27/2014   Family history of uterine cancer    Breast cancer screening, high risk patient 03/30/2014   Allergic rhinitis 03/30/2014   Migraine 03/30/2014   History of depression 03/30/2014   Family history of breast cancer 03/30/2014   Past Medical History:  Diagnosis Date   Allergy    Anxiety    Asthma    Basal cell carcinoma 11/05/2017   Left upper back. Nodular pattern. Excised   Depression    Family history of malignant neoplasm of breast    Family history of uterine cancer    Migraine    UTI (lower urinary tract infection)    Past Surgical History:  Procedure Laterality Date   BREAST BIOPSY  2011   benign   BREAST BIOPSY Right 2020   BREAST EXCISIONAL BIOPSY Left    RADIOACTIVE SEED GUIDED EXCISIONAL BREAST BIOPSY Left 08/27/2021   Procedure: LEFT BREAST SEED GUIDED EXCISIONAL BIOPSY;  Surgeon: Emelia Loron, MD;  Location: [facility];  Service: General;  Laterality: Left;  GEN & LMA   TONSILLECTOMY AND ADENOIDECTOMY  1993   Social History   Tobacco Use   Smoking status: Never   Smokeless tobacco: Never  Vaping Use   Vaping Use: Never used  Substance Use Topics   Alcohol use: Yes    Alcohol/week: 4.0 standard drinks of alcohol    Types: 2 Glasses of wine, 2 Cans of beer per week    Comment: occ   Drug use: No   Family History  Problem Relation Age of Onset   Breast cancer Mother 14   Cancer Mother        uterine @ 58, breast @ 56, and melanoma @ 67   Hypertension Mother    Asthma Mother    Arrhythmia Mother    Anxiety disorder Mother    Hearing loss Mother    Kidney disease Mother    Varicose Veins Mother    Hyperlipidemia Father    Hypertension Father    Diabetes Father    Heart block Father        had angioplasty   Heart disease Father    Obesity Father    Stroke Father    Vision loss Father    Cancer  Maternal Aunt  skin & colon polyps (#/type?)   Cancer Maternal Uncle        skin   Breast cancer Paternal Aunt 49   Cancer Paternal Aunt 90       breast; currently 20   Cancer Maternal Grandfather        lung; smoker; deceased 64   Cancer Paternal Grandmother 18       colon cancer; deceased 46   Alcohol abuse Paternal Grandfather    Migraines Other        paternal side   Allergies  Allergen Reactions   Codone [Hydrocodone]     N/v   Elemental Sulfur     Swelling    Penicillins     rash   Current Outpatient Medications on File Prior to Visit  Medication Sig Dispense Refill   albuterol (VENTOLIN HFA) 108 (90 Base) MCG/ACT inhaler Inhale 2 puffs into the lungs every 6 (six) hours as needed for wheezing or shortness of breath. 8 g 0   BLISOVI FE 1/20 1-20 MG-MCG tablet TAKE 1 TABLET BY MOUTH DAILY 84 tablet 4   busPIRone (BUSPAR) 15 MG tablet TAKE 1/2 TABLET(7.5 MG) BY MOUTH TWICE DAILY 90 tablet 0   escitalopram (LEXAPRO) 20 MG tablet TAKE 1 TABLET(20 MG) BY MOUTH DAILY 90 tablet 0   fluticasone (FLONASE) 50 MCG/ACT nasal spray SHAKE LIQUID AND USE 2 SPRAYS IN EACH NOSTRIL DAILY 16 g 2   loratadine (CLARITIN) 10 MG tablet Take 10 mg by mouth daily.     OnabotulinumtoxinA (BOTOX IJ) Inject as directed. Reported on 02/23/2015     SUMAtriptan (IMITREX) 100 MG tablet Take 100 mg by mouth every 2 (two) hours as needed for migraine or headache. Reported on 02/23/2015     No current facility-administered medications on file prior to visit.    Review of Systems  Constitutional:  Negative for activity change, appetite change, fatigue, fever and unexpected weight change.  HENT:  Negative for congestion, ear pain, rhinorrhea, sinus pressure and sore throat.   Eyes:  Negative for pain, redness and visual disturbance.  Respiratory:  Negative for cough, shortness of breath and wheezing.   Cardiovascular:  Negative for chest pain and palpitations.  Gastrointestinal:  Negative for  abdominal pain, blood in stool, constipation and diarrhea.  Endocrine: Negative for polydipsia and polyuria.  Genitourinary:  Negative for dysuria, frequency and urgency.  Musculoskeletal:  Negative for arthralgias, back pain and myalgias.  Skin:  Negative for pallor and rash.  Allergic/Immunologic: Negative for environmental allergies.  Neurological:  Negative for dizziness, syncope and headaches.  Hematological:  Negative for adenopathy. Does not bruise/bleed easily.  Psychiatric/Behavioral:  Negative for decreased concentration and dysphoric mood. The patient is not nervous/anxious.        Objective:   Physical Exam Constitutional:      General: She is not in acute distress.    Appearance: Normal appearance. She is well-developed. She is not ill-appearing or diaphoretic.  HENT:     Head: Normocephalic and atraumatic.     Right Ear: Tympanic membrane, ear canal and external ear normal.     Left Ear: Tympanic membrane, ear canal and external ear normal.     Nose: Nose normal. No congestion.     Mouth/Throat:     Mouth: Mucous membranes are moist.     Pharynx: Oropharynx is clear. No posterior oropharyngeal erythema.  Eyes:     General: No scleral icterus.    Extraocular Movements: Extraocular movements intact.  Conjunctiva/sclera: Conjunctivae normal.     Pupils: Pupils are equal, round, and reactive to light.  Neck:     Thyroid: No thyromegaly.     Vascular: No carotid bruit or JVD.  Cardiovascular:     Rate and Rhythm: Normal rate and regular rhythm.     Pulses: Normal pulses.     Heart sounds: Normal heart sounds.     No gallop.  Pulmonary:     Effort: Pulmonary effort is normal. No respiratory distress.     Breath sounds: Normal breath sounds. No wheezing.     Comments: Good air exch Chest:     Chest wall: No tenderness.  Abdominal:     General: Bowel sounds are normal. There is no distension or abdominal bruit.     Palpations: Abdomen is soft. There is no mass.      Tenderness: There is no abdominal tenderness.     Hernia: No hernia is present.  Genitourinary:    Comments: Gyn provider does annusl breast ad pelvic exam Musculoskeletal:        General: No tenderness. Normal range of motion.     Cervical back: Normal range of motion and neck supple. No rigidity. No muscular tenderness.     Right lower leg: No edema.     Left lower leg: No edema.     Comments: No kyphosis   Lymphadenopathy:     Cervical: No cervical adenopathy.  Skin:    General: Skin is warm and dry.     Coloration: Skin is not pale.     Findings: No erythema or rash.  Neurological:     Mental Status: She is alert. Mental status is at baseline.     Cranial Nerves: No cranial nerve deficit.     Motor: No abnormal muscle tone.     Coordination: Coordination normal.     Gait: Gait normal.     Deep Tendon Reflexes: Reflexes are normal and symmetric. Reflexes normal.  Psychiatric:        Mood and Affect: Mood normal.        Cognition and Memory: Cognition and memory normal.           Assessment & Plan:   Problem List Items Addressed This Visit       Other   Breast cancer screening, high risk patient    Strong family history  Pt has mammograms and MRIs  Up to date  Next mammogram has planned  Has had biopsies Genetic testing noted no pathologic mutations       Family history of breast cancer    Pt is up to date with screening with mammogram and mri       Generalized anxiety disorder    Doing well  Lexapro 20 mg daily  Thinks she may want to wean this summer if stress remains lower Discussed way to plan this       Routine general medical examination at a health care facility - Primary    Reviewed health habits including diet and exercise and skin cancer prevention Reviewed appropriate screening tests for age  Also reviewed health mt list, fam hx and immunization status , as well as social and family history   See HPI Labs reviewed and ordered See  HPI Pap 01/2022  normal with neg HPV, is up to date with gyn care , on OC  Mood is improved/ PHQ of 0 Will discuss colon cancer screening at 44

## 2022-07-14 NOTE — Assessment & Plan Note (Signed)
Reviewed health habits including diet and exercise and skin cancer prevention Reviewed appropriate screening tests for age  Also reviewed health mt list, fam hx and immunization status , as well as social and family history   See HPI Labs reviewed and ordered See HPI Pap 01/2022  normal with neg HPV, is up to date with gyn care , on OC  Mood is improved/ PHQ of 0 Will discuss colon cancer screening at 44

## 2022-07-14 NOTE — Assessment & Plan Note (Signed)
Strong family history  Pt has mammograms and MRIs  Up to date  Next mammogram has planned  Has had biopsies Genetic testing noted no pathologic mutations

## 2022-07-14 NOTE — Assessment & Plan Note (Signed)
Doing well  Lexapro 20 mg daily  Thinks she may want to wean this summer if stress remains lower Discussed way to plan this

## 2022-07-14 NOTE — Assessment & Plan Note (Signed)
Pt is up to date with screening with mammogram and mri

## 2022-07-31 ENCOUNTER — Ambulatory Visit: Payer: BC Managed Care – PPO | Admitting: Dermatology

## 2022-07-31 DIAGNOSIS — W908XXA Exposure to other nonionizing radiation, initial encounter: Secondary | ICD-10-CM

## 2022-07-31 DIAGNOSIS — L821 Other seborrheic keratosis: Secondary | ICD-10-CM

## 2022-07-31 DIAGNOSIS — Z1283 Encounter for screening for malignant neoplasm of skin: Secondary | ICD-10-CM | POA: Diagnosis not present

## 2022-07-31 DIAGNOSIS — L814 Other melanin hyperpigmentation: Secondary | ICD-10-CM

## 2022-07-31 DIAGNOSIS — D229 Melanocytic nevi, unspecified: Secondary | ICD-10-CM

## 2022-07-31 DIAGNOSIS — D1801 Hemangioma of skin and subcutaneous tissue: Secondary | ICD-10-CM | POA: Diagnosis not present

## 2022-07-31 DIAGNOSIS — X32XXXA Exposure to sunlight, initial encounter: Secondary | ICD-10-CM

## 2022-07-31 DIAGNOSIS — Z85828 Personal history of other malignant neoplasm of skin: Secondary | ICD-10-CM

## 2022-07-31 DIAGNOSIS — L578 Other skin changes due to chronic exposure to nonionizing radiation: Secondary | ICD-10-CM

## 2022-07-31 NOTE — Progress Notes (Signed)
   Follow-Up Visit   Subjective  Kirsten Wilson is a 42 y.o. female who presents for the following: Skin Cancer Screening and Full Body Skin Exam  The patient presents for Total-Body Skin Exam (TBSE) for skin cancer screening and mole check. The patient has spots, moles and lesions to be evaluated, some may be new or changing and the patient has concerns that these could be cancer.    The following portions of the chart were reviewed this encounter and updated as appropriate: medications, allergies, medical history  Review of Systems:  No other skin or systemic complaints except as noted in HPI or Assessment and Plan.  Objective  Well appearing patient in no apparent distress; mood and affect are within normal limits.  A full examination was performed including scalp, head, eyes, ears, nose, lips, neck, chest, axillae, abdomen, back, buttocks, bilateral upper extremities, bilateral lower extremities, hands, feet, fingers, toes, fingernails, and toenails. All findings within normal limits unless otherwise noted below.   Relevant physical exam findings are noted in the Assessment and Plan.    Assessment & Plan   LENTIGINES, SEBORRHEIC KERATOSES, HEMANGIOMAS - Benign normal skin lesions - Benign-appearing - Call for any changes  MELANOCYTIC NEVI - Tan-brown and/or pink-flesh-colored symmetric macules and papules - Benign appearing on exam today - Observation - Call clinic for new or changing moles - Recommend daily use of broad spectrum spf 30+ sunscreen to sun-exposed areas.   ACTINIC DAMAGE - Chronic condition, secondary to cumulative UV/sun exposure - diffuse scaly erythematous macules with underlying dyspigmentation - Recommend daily broad spectrum sunscreen SPF 30+ to sun-exposed areas, reapply every 2 hours as needed.  - Staying in the shade or wearing long sleeves, sun glasses (UVA+UVB protection) and wide brim hats (4-inch brim around the entire circumference of the  hat) are also recommended for sun protection.  - Call for new or changing lesions.  HISTORY OF BASAL CELL CARCINOMA OF THE SKIN - No evidence of recurrence today - Recommend regular full body skin exams - Recommend daily broad spectrum sunscreen SPF 30+ to sun-exposed areas, reapply every 2 hours as needed.  - Call if any new or changing lesions are noted between office visits  SKIN CANCER SCREENING PERFORMED TODAY.  Return in about 1 year (around 07/31/2023) for TBSE.  Maylene Roes, CMA, am acting as scribe for Darden Dates, MD .   Documentation: I have reviewed the above documentation for accuracy and completeness, and I agree with the above.  Darden Dates, MD

## 2022-07-31 NOTE — Patient Instructions (Addendum)

## 2022-08-29 ENCOUNTER — Ambulatory Visit: Payer: BC Managed Care – PPO

## 2022-09-16 ENCOUNTER — Other Ambulatory Visit: Payer: Self-pay | Admitting: Family Medicine

## 2022-10-01 ENCOUNTER — Other Ambulatory Visit: Payer: Self-pay | Admitting: Family Medicine

## 2022-11-11 ENCOUNTER — Ambulatory Visit
Admission: RE | Admit: 2022-11-11 | Discharge: 2022-11-11 | Disposition: A | Discharge status: Home / Self Care | Payer: BC Managed Care – PPO | Source: Ambulatory Visit | Attending: Hematology and Oncology

## 2022-11-11 DIAGNOSIS — Z1239 Encounter for other screening for malignant neoplasm of breast: Secondary | ICD-10-CM

## 2022-11-13 ENCOUNTER — Encounter (HOSPITAL_BASED_OUTPATIENT_CLINIC_OR_DEPARTMENT_OTHER): Payer: Self-pay | Admitting: Obstetrics & Gynecology

## 2022-12-14 ENCOUNTER — Other Ambulatory Visit: Payer: Self-pay | Admitting: Family Medicine

## 2023-01-09 ENCOUNTER — Ambulatory Visit (HOSPITAL_BASED_OUTPATIENT_CLINIC_OR_DEPARTMENT_OTHER): Payer: BC Managed Care – PPO | Admitting: Obstetrics & Gynecology

## 2023-01-09 ENCOUNTER — Encounter (HOSPITAL_BASED_OUTPATIENT_CLINIC_OR_DEPARTMENT_OTHER): Payer: Self-pay | Admitting: Obstetrics & Gynecology

## 2023-01-09 ENCOUNTER — Other Ambulatory Visit (HOSPITAL_COMMUNITY)
Admission: RE | Admit: 2023-01-09 | Discharge: 2023-01-09 | Disposition: A | Discharge status: Home / Self Care | Payer: BC Managed Care – PPO | Source: Ambulatory Visit | Attending: Obstetrics & Gynecology | Admitting: Obstetrics & Gynecology

## 2023-01-09 VITALS — BP 123/71 | HR 69 | Ht 63.0 in | Wt 170.0 lb

## 2023-01-09 DIAGNOSIS — Z803 Family history of malignant neoplasm of breast: Secondary | ICD-10-CM

## 2023-01-09 DIAGNOSIS — Z1239 Encounter for other screening for malignant neoplasm of breast: Secondary | ICD-10-CM | POA: Diagnosis not present

## 2023-01-09 DIAGNOSIS — Z8049 Family history of malignant neoplasm of other genital organs: Secondary | ICD-10-CM | POA: Diagnosis present

## 2023-01-09 DIAGNOSIS — G43009 Migraine without aura, not intractable, without status migrainosus: Secondary | ICD-10-CM

## 2023-01-09 DIAGNOSIS — Z01419 Encounter for gynecological examination (general) (routine) without abnormal findings: Secondary | ICD-10-CM

## 2023-01-09 DIAGNOSIS — Z124 Encounter for screening for malignant neoplasm of cervix: Secondary | ICD-10-CM | POA: Diagnosis present

## 2023-01-09 DIAGNOSIS — Q51810 Arcuate uterus: Secondary | ICD-10-CM

## 2023-01-09 MED ORDER — NORETHIN ACE-ETH ESTRAD-FE 1-20 MG-MCG PO TABS: 1.0000 | ORAL_TABLET | Freq: Every day | ORAL | 4 refills | Status: DC

## 2023-01-09 NOTE — Progress Notes (Signed)
42 y.o. G0P0000 Single White or Caucasian female here for annual exam.  Doing well.  MMG 11/2022 and last MRI was March.  Dr. Al Pimple ordered this.  Will repeat again this year.  Is seen yearly, typically.    Patient's last menstrual period was 12/25/2022 (approximate).          Sexually active: No.   Upstream - 01/09/23 1402       Pregnancy Intention Screening   Does the patient want to become pregnant in the next year? No    Does the patient's partner want to become pregnant in the next year? No    Would the patient like to discuss contraceptive options today? No      Contraception Wrap Up   Current Method Oral Contraceptive    Contraception Counseling Provided No            Exercising: she was and then stopped Smoker:  no  Health Maintenance: Pap:  01/02/2022 Negative MMG:  11/11/2022 Negative Colonoscopy:  guidelines reviewed Screening Labs: 07/2022   reports that she has never smoked. She has never used smokeless tobacco. She reports current alcohol use of about 4.0 standard drinks of alcohol per week. She reports that she does not use drugs.  Past Medical History:  Diagnosis Date   Allergy    Anxiety    Asthma    Basal cell carcinoma 11/05/2017   Left upper back. Nodular pattern. Excised   Depression    Family history of malignant neoplasm of breast    Family history of uterine cancer    Migraine    UTI (lower urinary tract infection)     Past Surgical History:  Procedure Laterality Date   BREAST BIOPSY  2011   benign   BREAST BIOPSY Right 2020   BREAST EXCISIONAL BIOPSY Left    RADIOACTIVE SEED GUIDED EXCISIONAL BREAST BIOPSY Left 08/27/2021   Procedure: LEFT BREAST SEED GUIDED EXCISIONAL BIOPSY;  Surgeon: Emelia Loron, MD;  Location: [facility];  Service: General;  Laterality: Left;  GEN & LMA   TONSILLECTOMY AND ADENOIDECTOMY  1993    Current Outpatient Medications  Medication Sig Dispense Refill   albuterol (VENTOLIN HFA) 108 (90  Base) MCG/ACT inhaler Inhale 2 puffs into the lungs every 6 (six) hours as needed for wheezing or shortness of breath. 8 g 0   BLISOVI FE 1/20 1-20 MG-MCG tablet TAKE 1 TABLET BY MOUTH DAILY 84 tablet 4   busPIRone (BUSPAR) 15 MG tablet TAKE 1/2 TABLET(7.5 MG) BY MOUTH TWICE DAILY 90 tablet 1   escitalopram (LEXAPRO) 20 MG tablet TAKE 1 TABLET(20 MG) BY MOUTH DAILY 90 tablet 2   fluticasone (FLONASE) 50 MCG/ACT nasal spray SHAKE LIQUID AND USE 2 SPRAYS IN EACH NOSTRIL DAILY 16 g 2   loratadine (CLARITIN) 10 MG tablet Take 10 mg by mouth daily.     OnabotulinumtoxinA (BOTOX IJ) Inject as directed. Reported on 02/23/2015     SUMAtriptan (IMITREX) 100 MG tablet Take 100 mg by mouth every 2 (two) hours as needed for migraine or headache. Reported on 02/23/2015     No current facility-administered medications for this visit.    Family History  Problem Relation Age of Onset   Breast cancer Mother 70   Cancer Mother        uterine @ 38, breast @ 22, and melanoma @ 34   Hypertension Mother    Asthma Mother    Arrhythmia Mother    Anxiety disorder Mother  Hearing loss Mother    Kidney disease Mother    Varicose Veins Mother    Hyperlipidemia Father    Hypertension Father    Diabetes Father    Heart block Father        had angioplasty   Heart disease Father    Obesity Father    Stroke Father    Vision loss Father    Cancer Maternal Aunt        skin & colon polyps (#/type?)   Cancer Maternal Uncle        skin   Breast cancer Paternal Aunt 24   Cancer Paternal Aunt 57       breast; currently 110   Cancer Maternal Grandfather        lung; smoker; deceased 78   Cancer Paternal Grandmother 46       colon cancer; deceased 73   Alcohol abuse Paternal Grandfather    Migraines Other        paternal side    ROS: Constitutional: negative Genitourinary:negative  Exam:   BP 123/71 (BP Location: Right Arm, Patient Position: Sitting, Cuff Size: Large)   Pulse 69   Ht 5\' 3"  (1.6 m)    Wt 170 lb (77.1 kg)   LMP 12/25/2022 (Approximate)   BMI 30.11 kg/m   Height: 5\' 3"  (160 cm)  General appearance: alert, cooperative and appears stated age Head: Normocephalic, without obvious abnormality, atraumatic Neck: no adenopathy, supple, symmetrical, trachea midline and thyroid normal to inspection and palpation Lungs: clear to auscultation bilaterally Breasts: normal appearance, no masses or tenderness Heart: regular rate and rhythm Abdomen: soft, non-tender; bowel sounds normal; no masses,  no organomegaly Extremities: extremities normal, atraumatic, no cyanosis or edema Skin: Skin color, texture, turgor normal. No rashes or lesions Lymph nodes: Cervical, supraclavicular, and axillary nodes normal. No abnormal inguinal nodes palpated Neurologic: Grossly normal   Pelvic: External genitalia:  no lesions              Urethra:  normal appearing urethra with no masses, tenderness or lesions              Bartholins and Skenes: normal                 Vagina: normal appearing vagina with normal color and no discharge, no lesions              Cervix: no lesions              Pap taken: Yes.   Bimanual Exam:  Uterus:  normal size, contour, position, consistency, mobility, non-tender              Adnexa: normal adnexa and no mass, fullness, tenderness               Rectovaginal: Confirms               Anus:  normal sphincter tone, no lesions  Chaperone, Ina Homes, CMA, was present for exam.  Assessment/Plan: 1. Well woman exam with routine gynecological exam - Pap smear obtained today.  HR HPV was negative last year.  Not ordered this year. - Mammogram 11/13/2022 - Colonoscopy guidelines reviewed - lab work done with Dr. Milinda Antis this year - vaccines reviewed/updated  2. Family history of uterine cancer - norethindrone-ethinyl estradiol-FE (BLISOVI FE 1/20) 1-20 MG-MCG tablet; Take 1 tablet by mouth daily.  Dispense: 84 tablet; Refill: 4 - US PELVIS TRANSVAGINAL NON-OB (TV  ONLY); Future  3. Migraine without aura and  without status migrainosus, not intractable - followed by neurology  4. Family history of breast cancer  5. Breast cancer screening, high risk patient - having every six month MMG and MRI  6. Cervical cancer screening - Cytology - PAP( [place])  7. Arcuate uterus

## 2023-01-13 LAB — CYTOLOGY - PAP
Adequacy: ABSENT
Diagnosis: NEGATIVE

## 2023-02-12 ENCOUNTER — Encounter (HOSPITAL_BASED_OUTPATIENT_CLINIC_OR_DEPARTMENT_OTHER): Payer: Self-pay | Admitting: Obstetrics & Gynecology

## 2023-02-12 ENCOUNTER — Ambulatory Visit (HOSPITAL_BASED_OUTPATIENT_CLINIC_OR_DEPARTMENT_OTHER): Payer: BC Managed Care – PPO

## 2023-02-12 ENCOUNTER — Ambulatory Visit (HOSPITAL_BASED_OUTPATIENT_CLINIC_OR_DEPARTMENT_OTHER): Payer: BC Managed Care – PPO | Admitting: Obstetrics & Gynecology

## 2023-02-12 VITALS — BP 123/77 | HR 55 | Ht 62.0 in | Wt 172.4 lb

## 2023-02-12 DIAGNOSIS — Z8049 Family history of malignant neoplasm of other genital organs: Secondary | ICD-10-CM | POA: Diagnosis not present

## 2023-02-15 NOTE — Progress Notes (Signed)
GYNECOLOGY  VISIT  CC:   Discuss ultrasound results, family hx of uterine cancer  HPI: 42 y.o. G0P0000 Single White or Caucasian female here for discussion of ultrasound results.  Ultrasound obtained due to family history of uterine cancer.  Pt is on OCPs.  Uterus normal.  Ovaries normal with small left follicle noted.  Endometrium 3.59mm.  Discussed findings with pt.   Past Medical History:  Diagnosis Date   Allergy    Anxiety    Asthma    Basal cell carcinoma 11/05/2017   Left upper back. Nodular pattern. Excised   Depression    Family history of malignant neoplasm of breast    Family history of uterine cancer    Migraine    UTI (lower urinary tract infection)     MEDS:   Current Outpatient Medications on File Prior to Visit  Medication Sig Dispense Refill   albuterol (VENTOLIN HFA) 108 (90 Base) MCG/ACT inhaler Inhale 2 puffs into the lungs every 6 (six) hours as needed for wheezing or shortness of breath. 8 g 0   busPIRone (BUSPAR) 15 MG tablet TAKE 1/2 TABLET(7.5 MG) BY MOUTH TWICE DAILY 90 tablet 1   escitalopram (LEXAPRO) 20 MG tablet TAKE 1 TABLET(20 MG) BY MOUTH DAILY 90 tablet 2   fluticasone (FLONASE) 50 MCG/ACT nasal spray SHAKE LIQUID AND USE 2 SPRAYS IN EACH NOSTRIL DAILY 16 g 2   loratadine (CLARITIN) 10 MG tablet Take 10 mg by mouth daily.     norethindrone-ethinyl estradiol-FE (BLISOVI FE 1/20) 1-20 MG-MCG tablet Take 1 tablet by mouth daily. 84 tablet 4   OnabotulinumtoxinA (BOTOX IJ) Inject as directed. Reported on 02/23/2015     SUMAtriptan (IMITREX) 100 MG tablet Take 100 mg by mouth every 2 (two) hours as needed for migraine or headache. Reported on 02/23/2015     No current facility-administered medications on file prior to visit.    ALLERGIES: Codone [hydrocodone], Elemental sulfur, and Penicillins  SH:  single, non smoker  Review of Systems  Constitutional: Negative.   Genitourinary: Negative.     PHYSICAL EXAMINATION:    BP 123/77 (BP  Location: Right Arm, Patient Position: Sitting, Cuff Size: Normal)   Pulse (!) 55   Ht 5\' 2"  (1.575 m) Comment: Reported  Wt 172 lb 6.4 oz (78.2 kg)   BMI 31.53 kg/m      Physical Exam Constitutional:      Appearance: Normal appearance.  Neurological:     General: No focal deficit present.     Mental Status: She is alert.  Psychiatric:        Mood and Affect: Mood normal.     Assessment/Plan: 1. Family history of uterine cancer (Primary) - on OCPs.  Having yearly pap smear and yearly ultrasounds.

## 2023-04-14 ENCOUNTER — Encounter: Payer: Self-pay | Admitting: Hematology and Oncology

## 2023-04-14 DIAGNOSIS — Z803 Family history of malignant neoplasm of breast: Secondary | ICD-10-CM

## 2023-04-14 DIAGNOSIS — Z1239 Encounter for other screening for malignant neoplasm of breast: Secondary | ICD-10-CM

## 2023-04-14 DIAGNOSIS — Z8049 Family history of malignant neoplasm of other genital organs: Secondary | ICD-10-CM

## 2023-04-14 DIAGNOSIS — Z1379 Encounter for other screening for genetic and chromosomal anomalies: Secondary | ICD-10-CM

## 2023-04-15 ENCOUNTER — Other Ambulatory Visit: Payer: Self-pay | Admitting: *Deleted

## 2023-04-15 DIAGNOSIS — Z8049 Family history of malignant neoplasm of other genital organs: Secondary | ICD-10-CM

## 2023-04-15 DIAGNOSIS — Z1379 Encounter for other screening for genetic and chromosomal anomalies: Secondary | ICD-10-CM

## 2023-04-15 DIAGNOSIS — Z1239 Encounter for other screening for malignant neoplasm of breast: Secondary | ICD-10-CM

## 2023-04-15 DIAGNOSIS — Z803 Family history of malignant neoplasm of breast: Secondary | ICD-10-CM

## 2023-04-20 IMAGING — MR MR BREAST BILAT WO/W CM
8 of 12 series · 33 of 48 positions shown · IV contrast (7ml gadavist)
Comparison: Previous exam(s).

CLINICAL DATA: 39-year-old female presenting for high risk
screening MRI due to strong family history. She has history of a
benign MRI guided biopsy 09/30/2018 with pathology indicating
fibrocystic change of the upper-outer right breast.

LABS:  n/a
EXAM:
BILATERAL BREAST MRI WITH AND WITHOUT CONTRAST
TECHNIQUE: Multiplanar, multisequence MR images of both breasts were obtained
prior to and following the intravenous administration of 7 ml of
Gadavist

[Series 2: t2_tirm_tra ipat (a-p) · axial · 3.0mm · 0.66mm/px · 1 of 55 slices shown]
[im 1/55]
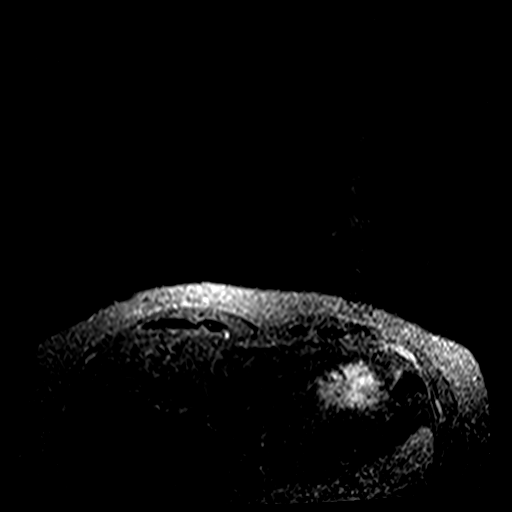

[Series 3: fl3d pre-cm no · axial · non-contrast · 1.2mm · 0.89mm/px · z∈[-97,+55]mm · 5 of 128 slices shown]
[im 1/128]
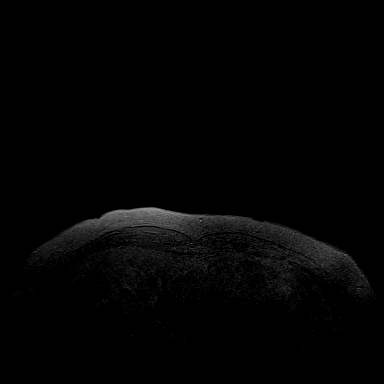
[im 32/128]
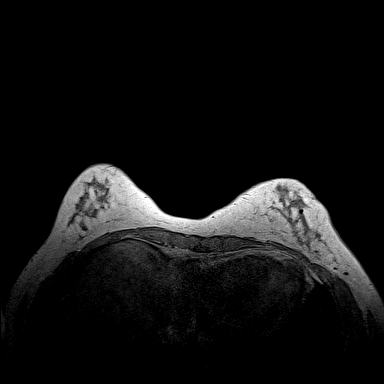
[im 64/128]
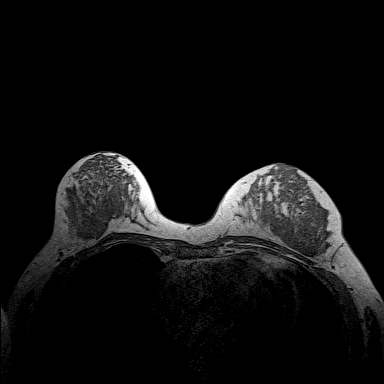
[im 96/128]
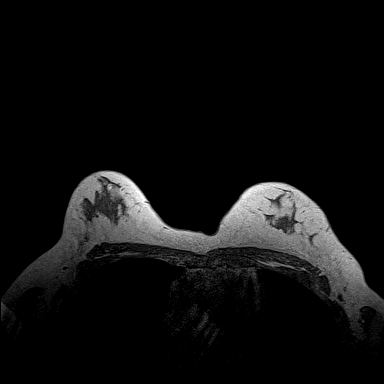
[im 128/128]
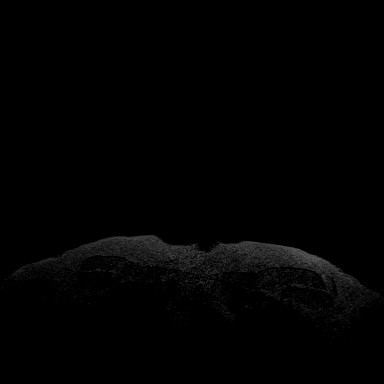

[Series 4: fl3d pre-cm · axial · non-contrast · 1.2mm · 0.89mm/px · z∈[-97,+55]mm · 5 of 128 slices shown]
[im 1/128]
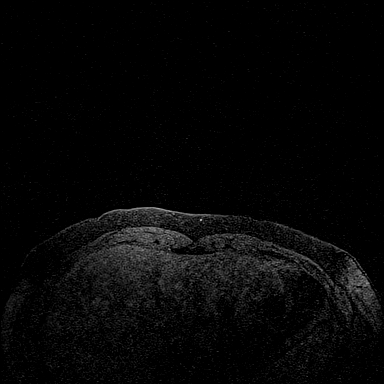
[im 32/128]
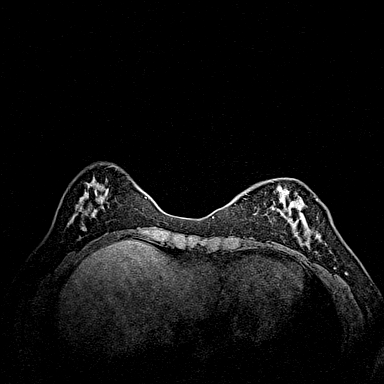
[im 64/128]
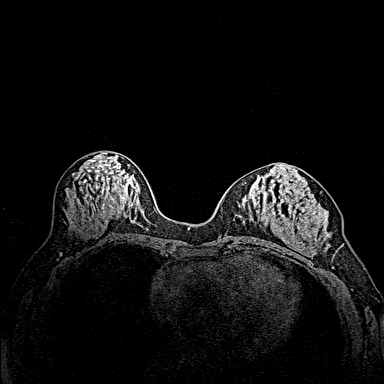
[im 96/128]
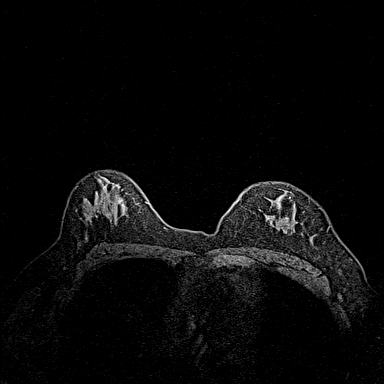
[im 128/128]
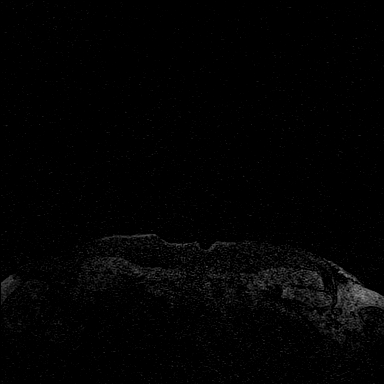

[Series 5: fl3d post-cm 20 · axial · 1.2mm · 0.89mm/px · z∈[-97,+55]mm · 5 of 128 slices shown (1 of 3)]
[im 1/128]
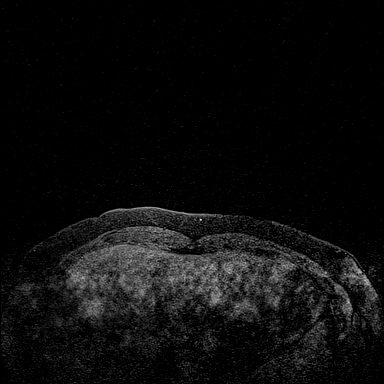
[im 32/128]
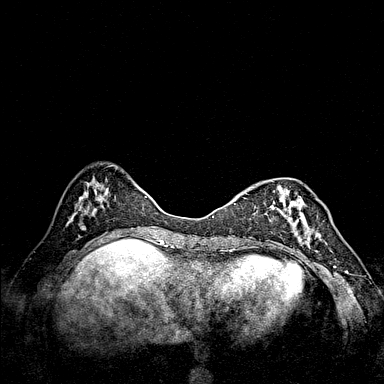
[im 64/128]
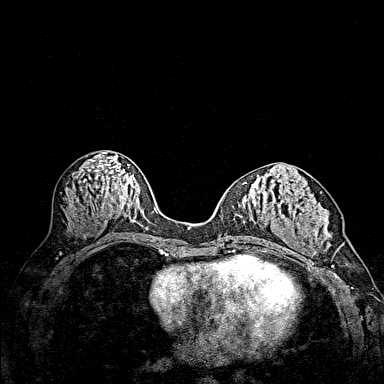
[im 96/128]
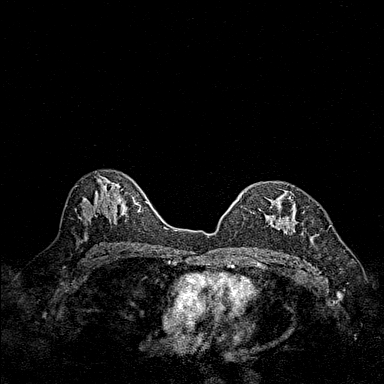
[im 128/128]
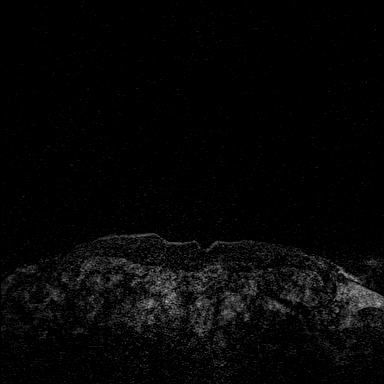

[Series 6: fl3d post-cm 20 · axial · 1.2mm · 0.89mm/px · z∈[-97,+55]mm · 5 of 128 slices shown (2 of 3)]
[im 1/128]
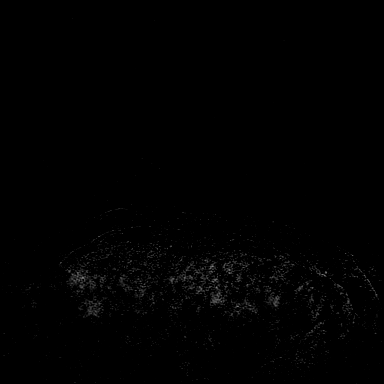
[im 32/128]
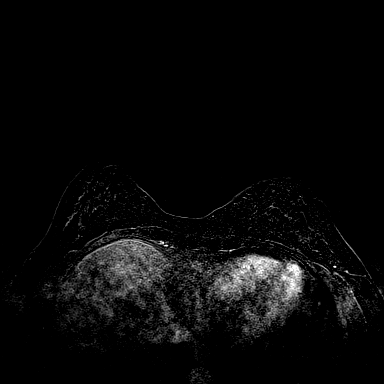
[im 64/128]
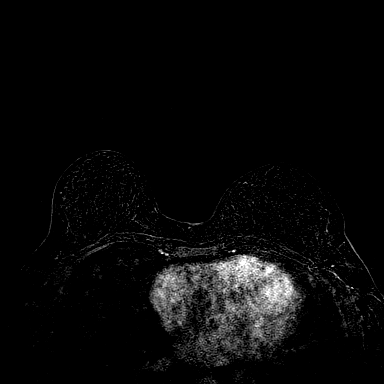
[im 96/128]
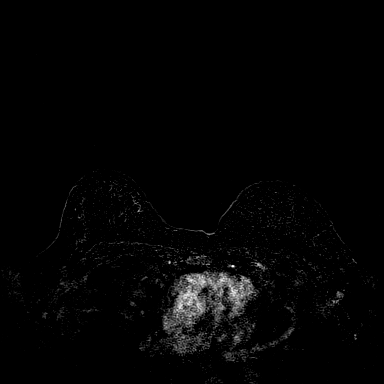
[im 128/128]
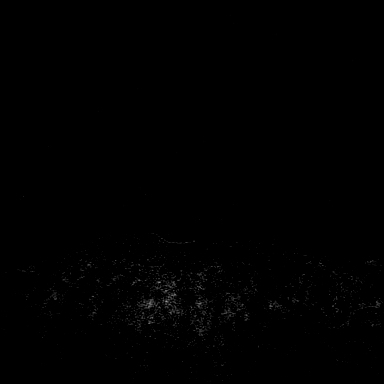

[Series 7: fl3d post-cm 20 · axial · 153.6mm · 0.89mm/px · 1 of 1 slices shown (3 of 3)]
[im 1/1]
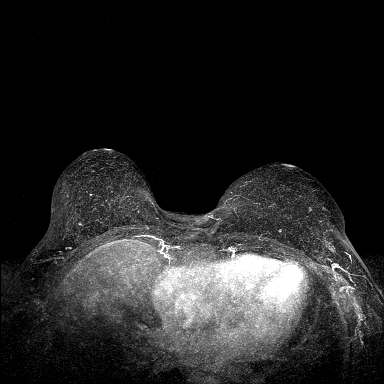

[Series 8: fl3d post-cm 3min · axial · 1.2mm · 0.89mm/px · z∈[-97,+55]mm · 6 of 128 slices shown]
[im 1/128]
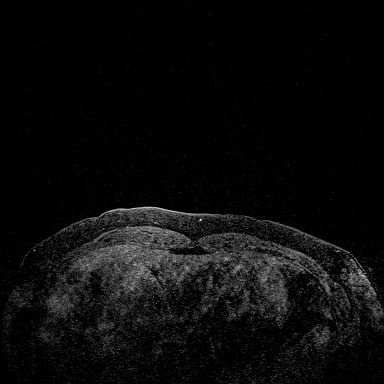
[im 26/128]
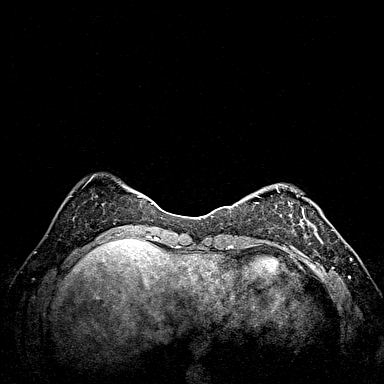
[im 51/128]
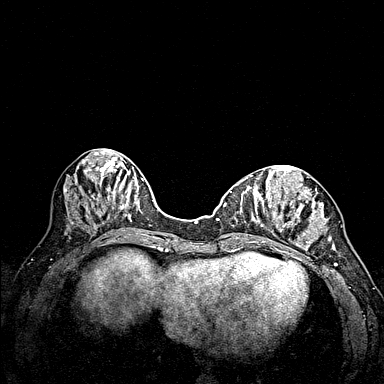
[im 77/128]
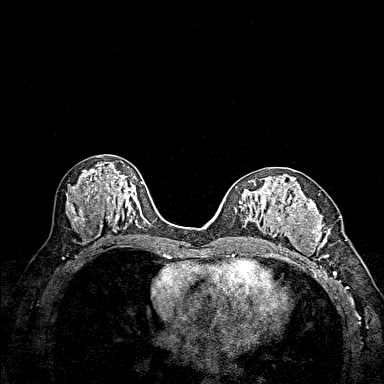
[im 102/128]
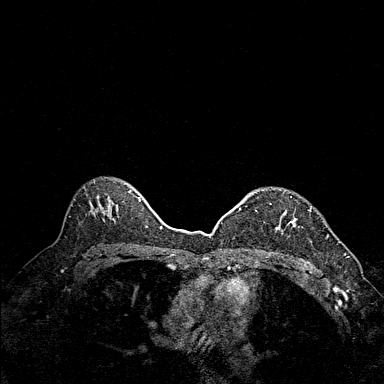
[im 128/128]
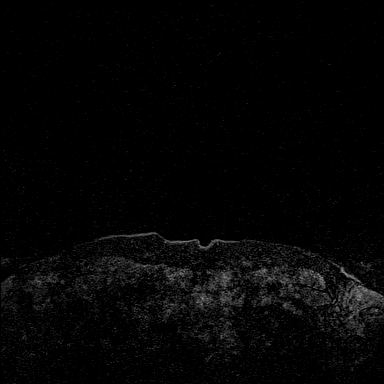

[Series 9: fl3d post-cm 3min_sub · axial · 1.2mm · 0.89mm/px · z∈[-97,+24]mm · 5 of 128 slices shown]
[im 1/128]
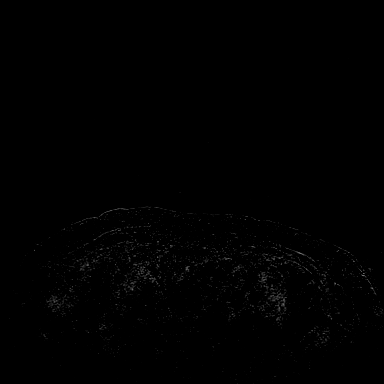
[im 26/128]
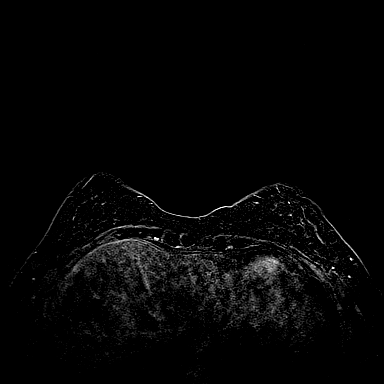
[im 51/128]
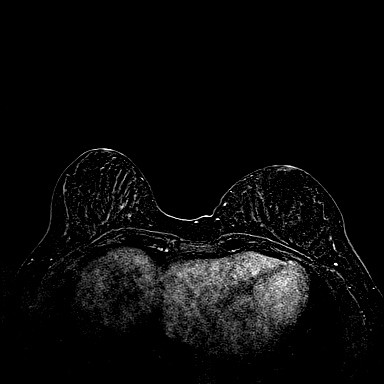
[im 77/128]
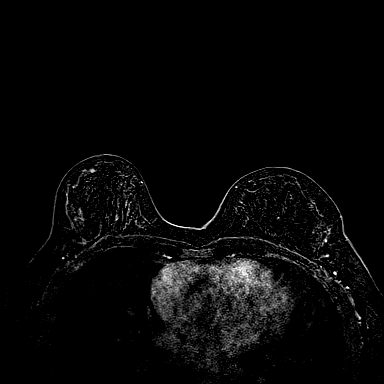
[im 102/128]
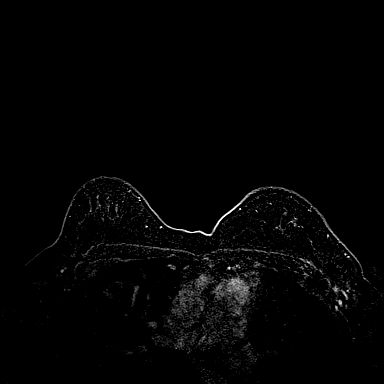

[33 of 48 positions shown; findings below may reference images not displayed]

Three-dimensional MR images were rendered by post-processing of the
original MR data on an independent workstation. The
three-dimensional MR images were interpreted, and findings are
reported in the following complete MRI report for this study. Three
dimensional images were evaluated at the independent interpreting
workstation using the DynaCAD thin client.
FINDINGS: Breast composition: d. Extreme fibroglandular tissue.

Background parenchymal enhancement: Minimal

Right breast: No mass or abnormal enhancement.

Left breast: No mass or abnormal enhancement.

Lymph nodes: No abnormal appearing lymph nodes.

Ancillary findings:  None.
IMPRESSION: No MRI evidence of malignancy in the bilateral breasts.

RECOMMENDATION:
1.  Annual screening mammography is due in Wednesday June, 2021.

2. Per [employer] guidelines, if the patient has a
calculated lifetime risk of developing breast cancer of greater than
20%, annual screening MRI of the breasts would be recommended.

BI-RADS CATEGORY  1: Negative.

## 2023-04-22 ENCOUNTER — Other Ambulatory Visit: Payer: Self-pay | Admitting: *Deleted

## 2023-06-03 ENCOUNTER — Telehealth: Payer: Self-pay | Admitting: Hematology and Oncology

## 2023-06-03 NOTE — Telephone Encounter (Signed)
 Spoke with pt about rescheduled appointment time and date.

## 2023-06-18 ENCOUNTER — Ambulatory Visit
Admission: RE | Admit: 2023-06-18 | Discharge: 2023-06-18 | Disposition: A | Discharge status: Home / Self Care | Payer: Self-pay | Source: Ambulatory Visit | Attending: Hematology and Oncology | Admitting: Hematology and Oncology

## 2023-06-18 DIAGNOSIS — Z803 Family history of malignant neoplasm of breast: Secondary | ICD-10-CM

## 2023-06-18 DIAGNOSIS — Z1239 Encounter for other screening for malignant neoplasm of breast: Secondary | ICD-10-CM

## 2023-06-18 DIAGNOSIS — Z8049 Family history of malignant neoplasm of other genital organs: Secondary | ICD-10-CM

## 2023-06-18 DIAGNOSIS — Z1379 Encounter for other screening for genetic and chromosomal anomalies: Secondary | ICD-10-CM

## 2023-06-18 MED ORDER — GADOPICLENOL 0.5 MMOL/ML IV SOLN
8.0000 mL | Freq: Once | INTRAVENOUS | Status: AC | PRN
  Administered 2023-06-18: 8 mL via INTRAVENOUS

## 2023-06-27 ENCOUNTER — Ambulatory Visit: Payer: BC Managed Care – PPO | Admitting: Hematology and Oncology

## 2023-07-03 ENCOUNTER — Other Ambulatory Visit: Payer: Self-pay

## 2023-07-06 ENCOUNTER — Other Ambulatory Visit: Payer: Self-pay | Admitting: Family Medicine

## 2023-07-08 ENCOUNTER — Telehealth: Payer: Self-pay

## 2023-07-08 NOTE — Telephone Encounter (Signed)
 Spoke with patient and confirmed appointment on 07/09/23

## 2023-07-09 ENCOUNTER — Encounter: Payer: Self-pay | Admitting: Hematology and Oncology

## 2023-07-09 ENCOUNTER — Inpatient Hospital Stay: Payer: Self-pay | Attending: Hematology and Oncology | Admitting: Hematology and Oncology

## 2023-07-09 VITALS — BP 114/71 | HR 64 | Temp 97.9°F | Resp 16 | Wt 175.2 lb

## 2023-07-09 DIAGNOSIS — Z808 Family history of malignant neoplasm of other organs or systems: Secondary | ICD-10-CM | POA: Diagnosis not present

## 2023-07-09 DIAGNOSIS — Z1501 Genetic susceptibility to malignant neoplasm of breast: Secondary | ICD-10-CM | POA: Diagnosis present

## 2023-07-09 DIAGNOSIS — Z1239 Encounter for other screening for malignant neoplasm of breast: Secondary | ICD-10-CM | POA: Diagnosis not present

## 2023-07-09 DIAGNOSIS — Z8049 Family history of malignant neoplasm of other genital organs: Secondary | ICD-10-CM | POA: Insufficient documentation

## 2023-07-09 DIAGNOSIS — Z803 Family history of malignant neoplasm of breast: Secondary | ICD-10-CM | POA: Diagnosis present

## 2023-07-09 NOTE — Progress Notes (Signed)
 [facility]  [phone] [phone]     ID: Kirsten Wilson DOB: 03-Aug-1980  MR#: [id number]  CZY#:[id number]  Patient Care Team: Clemens Curt, MD as PCP - General (Family Medicine) Lillian Rein, MD as Consulting Physician (Gynecology) [facility], [place] , MD (Inactive) as Consulting Physician (Dermatology) Murleen Arms, MD as Consulting Physician (Hematology and Oncology)  CHIEF COMPLAINT: high risk family history for breast, uterine and skin cancer  CURRENT TREATMENT: Intensified screening  INTERVAL HISTORY:  Discussed the use of AI scribe software for clinical note transcription with the patient, who gave verbal consent to proceed.  History of Present Illness Kirsten Wilson is a 43 year old female who presents for intensified breast cancer screening.  She is undergoing intensified breast cancer screening, alternating between regular MRI and mammograms to avoid clustering appointments. Her recent MRI was completed, and her next mammogram is scheduled for September.  Her medications have remained unchanged over the past year. She has completed her annual screening with her primary care physician, including a normal Pap smear. An upcoming dermatology appointment is scheduled for the end of the month.  She has not had an eye appointment in approximately three years and needs to schedule one.  She notes that her breasts feel dense, particularly in the outer portions, but she has not noticed any other changes or symptoms.     COVID 19 VACCINATION STATUS: s/p [place] x3, most recently 01/2020; infection 11/2020  HISTORY OF PRESENT ILLNESS: From the original intake note:  Kirsten Wilson moved to this in the area July 2015, from [place] . She had previously lived here. In [place]  she was followed by oncology, dermatology, and gynecology for a family history strongly suggestive of a genetic mutation putting her at increased risk for melanoma,  endometrial and breast cancers (her mother had all three cancers at an early age). She did meet with genetic counseling there and on 05/07/2010 was tested for the BRCA1 and BRCA2 genes, with no mutation detected. A broader panel or BART testing were not performed.  She now presents to our high Risk clinic for futher evaluation and follow-up   PAST MEDICAL HISTORY: Past Medical History:  Diagnosis Date   Allergy    Anxiety    Asthma    Basal cell carcinoma 11/05/2017   Left upper back. Nodular pattern. Excised   Depression    Family history of malignant neoplasm of breast    Family history of uterine cancer    Migraine    UTI (lower urinary tract infection)     PAST SURGICAL HISTORY: Past Surgical History:  Procedure Laterality Date   BREAST BIOPSY  2011   benign   BREAST BIOPSY Right 2020   BREAST EXCISIONAL BIOPSY Left    RADIOACTIVE SEED GUIDED EXCISIONAL BREAST BIOPSY Left 08/27/2021   Procedure: LEFT BREAST SEED GUIDED EXCISIONAL BIOPSY;  Surgeon: Enid Harry, MD;  Location: [facility];  Service: General;  Laterality: Left;  GEN & LMA   TONSILLECTOMY AND ADENOIDECTOMY  1993    FAMILY HISTORY Family History  Problem Relation Age of Onset   Breast cancer Mother 75   Cancer Mother        uterine @ 38, breast @ 53, and melanoma @ 10   Hypertension Mother    Asthma Mother    Arrhythmia Mother    Anxiety disorder Mother    Hearing loss Mother    Kidney disease Mother    Varicose Veins Mother    Hyperlipidemia  Father    Hypertension Father    Diabetes Father    Heart block Father        had angioplasty   Heart disease Father    Obesity Father    Stroke Father    Vision loss Father    Cancer Maternal Aunt        skin & colon polyps (#/type?)   Cancer Maternal Uncle        skin   Breast cancer Paternal Aunt 4   Cancer Paternal Aunt 30       breast; currently 60   Cancer Maternal Grandfather        lung; smoker; deceased 64   Cancer  Paternal Grandmother 76       colon cancer; deceased 27   Alcohol abuse Paternal Grandfather    Migraines Other        paternal side   the patient's mother was diagnosed with cancer of the uterus at age 79, cancer of the breast at age 8, and melanoma at age 82. This may well represent a p53 mutation, but there are many other possibilities. Unfortunately the patient's mother, who is still living, refuses to be genetically tested. The patient's maternal grandfather had a history of brain and lung cancer (these apparently were 2 separate primaries). He was a smoker. He died at age 54. 2 of the patient's mother's siblings had skin cancers, but not melanomas. On the paternal side the paternal grandmother was diagnosed with colon cancer at age 84 and a paternal aunt was diagnosed with breast cancer at age 16. The patient's father has a history of precancerous polyps.   GYNECOLOGIC HISTORY:  No LMP recorded. (Menstrual status: Oral contraceptives). Menarche age 74. The patient is GX P0. She understands that waiting after age 42 to carry a child essentially doubles the risk of breast cancer.   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 She is single, lives alone, with no pets    ADVANCED DIRECTIVES: Not in place. At her 04/26/2014 visit she was given the appropriate documents to complete and notarize at her discretion.   HEALTH MAINTENANCE: Social History   Tobacco Use   Smoking status: Never   Smokeless tobacco: Never  Vaping Use   Vaping status: Never Used  Substance Use Topics   Alcohol use: Yes    Alcohol/week: 4.0 standard drinks of alcohol    Types: 2 Glasses of wine, 2 Cans of beer per week    Comment: occ   Drug use: No     Colonoscopy:  Never  PAP: March 2015  Bone density: Never  Lipid panel:  Allergies  Allergen Reactions   Codone [Hydrocodone]     N/v   Elemental Sulfur     Swelling    Penicillins     rash    Current Outpatient Medications  Medication Sig Dispense Refill   albuterol  (VENTOLIN  HFA) 108 (90 Base) MCG/ACT inhaler Inhale 2 puffs into the lungs every 6 (six) hours as needed for wheezing or shortness of breath. 8 g 0   busPIRone  (BUSPAR ) 15 MG tablet TAKE 1/2 TABLET(7.5 MG) BY MOUTH TWICE DAILY 90 tablet 1   escitalopram  (LEXAPRO ) 20 MG tablet TAKE 1 TABLET(20 MG) BY MOUTH DAILY 90 tablet 2   fluticasone  (FLONASE ) 50  MCG/ACT nasal spray SHAKE LIQUID AND USE 2 SPRAYS IN EACH NOSTRIL DAILY 16 g 2   loratadine (CLARITIN) 10 MG tablet Take 10 mg by mouth daily.     norethindrone-ethinyl estradiol-FE (BLISOVI FE 1/20) 1-20 MG-MCG tablet Take 1 tablet by mouth daily. 84 tablet 4   OnabotulinumtoxinA (BOTOX IJ) Inject as directed. Reported on 02/23/2015     SUMAtriptan (IMITREX) 100 MG tablet Take 100 mg by mouth every 2 (two) hours as needed for migraine or headache. Reported on 02/23/2015     No current facility-administered medications for this visit.    OBJECTIVE: White woman in no acute distress  Vitals:   07/09/23 1553  BP: 114/71  Pulse: 64  Resp: 16  Temp: 97.9 F (36.6 C)  SpO2: 100%       Body mass index is 32.04 kg/m.    ECOG FS:0 - Asymptomatic  Sclerae unicteric, EOMs intact Extremely dense breasts. No palpable masses with in the limitation of exam No nipple changes No regional adenopathy No LE edema  LAB RESULTS:  CMP     Component Value Date/Time   NA 138 07/05/2022 1549   NA 144 08/31/2018 1347   NA 140 01/02/2017 1137   K 3.8 07/05/2022 1549   K 4.0 01/02/2017 1137   CL 103 07/05/2022 1549   CO2 23 07/05/2022 1549   CO2 26 01/02/2017 1137   GLUCOSE 81 07/05/2022 1549   GLUCOSE 94 01/02/2017 1137   BUN 11 07/05/2022 1549   BUN 8 08/31/2018 1347   BUN 10.8  01/02/2017 1137   CREATININE 0.78 07/05/2022 1549   CREATININE 0.8 01/02/2017 1137   CALCIUM 8.9 07/05/2022 1549   CALCIUM 9.2 01/02/2017 1137   PROT 6.4 07/05/2022 1549   PROT 6.2 08/31/2018 1347   PROT 6.6 01/02/2017 1137   ALBUMIN 4.2 12/27/2021 1429   ALBUMIN 4.3 08/31/2018 1347   ALBUMIN 3.9 01/02/2017 1137   AST 16 07/05/2022 1549   AST 17 12/27/2021 1429   AST 16 01/02/2017 1137   ALT 10 07/05/2022 1549   ALT 13 12/27/2021 1429   ALT 10 01/02/2017 1137   ALKPHOS 47 12/27/2021 1429   ALKPHOS 50 01/02/2017 1137   BILITOT 0.5 07/05/2022 1549   BILITOT 0.3 12/27/2021 1429   BILITOT 0.40 01/02/2017 1137   GFRNONAA >60 12/27/2021 1429   GFRAA 123 08/31/2018 1347   GFRAA >60 01/06/2018 1457    INo results found for: "SPEP", "UPEP"  Lab Results  Component Value Date   WBC 8.6 07/05/2022   NEUTROABS 5,212 07/05/2022   HGB 13.4 07/05/2022   HCT 38.7 07/05/2022   MCV 86.4 07/05/2022   PLT 247 07/05/2022      Chemistry      Component Value Date/Time   NA 138 07/05/2022 1549   NA 144 08/31/2018 1347   NA 140 01/02/2017 1137   K 3.8 07/05/2022 1549   K 4.0 01/02/2017 1137   CL 103 07/05/2022 1549   CO2 23 07/05/2022 1549   CO2 26 01/02/2017 1137   BUN 11 07/05/2022 1549   BUN 8 08/31/2018 1347   BUN 10.8 01/02/2017 1137   CREATININE 0.78 07/05/2022 1549   CREATININE 0.8 01/02/2017 1137      Component Value Date/Time   CALCIUM 8.9 07/05/2022 1549   CALCIUM 9.2 01/02/2017 1137   ALKPHOS 47 12/27/2021 1429   ALKPHOS 50 01/02/2017 1137   AST 16 07/05/2022 1549   AST 17 12/27/2021 1429   AST 16 01/02/2017  1137   ALT 10 07/05/2022 1549   ALT 13 12/27/2021 1429   ALT 10 01/02/2017 1137   BILITOT 0.5 07/05/2022 1549   BILITOT 0.3 12/27/2021 1429   BILITOT 0.40 01/02/2017 1137       No results found for: "LABCA2"  No components found for: "LABCA125"  No results for input(s): "INR" in the last 168 hours.  Urinalysis    Component Value Date/Time    BILIRUBINUR negative 06/14/2014 1630   PROTEINUR negative 06/14/2014 1630   UROBILINOGEN 0.2 06/14/2014 1630   NITRITE negative 06/14/2014 1630   LEUKOCYTESUR large (3+) 06/14/2014 1630    STUDIES: MR BREAST BILATERAL W WO CONTRAST INC CAD Result Date: 06/18/2023 CLINICAL DATA:  High risk screening MRI. Strong family history of breast cancer in the patient's mother at the age of 4 and her paternal on at the age of 24. History of benign biopsy 11/16/2021 in the lower outer quadrant the right breast demonstrating fibrocystic change and pseudoangiomatous stromal hyperplasia. History of excisional biopsy of atypia from the left breast on 08/27/2021. EXAM: BILATERAL BREAST MRI WITH AND WITHOUT CONTRAST TECHNIQUE: Multiplanar, multisequence MR images of both breasts were obtained prior to and following the intravenous administration of 8 ml of Vueway  Three-dimensional MR images were rendered by post-processing of the original MR data on an independent workstation. The three-dimensional MR images were interpreted, and findings are reported in the following complete MRI report for this study. Three dimensional images were evaluated at the independent interpreting workstation using the DynaCAD thin client. COMPARISON:  Previous exam(s). FINDINGS: Breast composition: d. Extreme fibroglandular tissue. Background parenchymal enhancement: Moderate. Right breast: No mass or abnormal enhancement. Left breast: No mass or abnormal enhancement. Lymph nodes: No abnormal appearing lymph nodes. Ancillary findings:  None. IMPRESSION: No abnormal enhancement in either breast. RECOMMENDATION: Bilateral screening mammogram in September of 2025 is recommended. Based on the recommendations of the American Cancer Society, annual screening MRI is suggested in addition to annual mammography if the patient has an estimated lifetime risk of developing breast cancer which is greater than 20%. BI-RADS CATEGORY  1: Negative. Electronically  Signed   By: Dina  Arceo M.D.   On: 06/18/2023 13:52     ASSESSMENT: 43 y.o. [place] woman with a high-risk family history for breast, uterine, melanoma and colon cancers; also with D density breasts  (a) biopsy of a mass in the upper outer right breast 09/30/2018 showed fibrocystic change with no evidence of malignancy.  (1) genetics testing 05/27/2014 through the BRCA 1 and 2 testing 05/07/2010 negative (BARTs not sent)  (2) genetics testing 05/05/2014 through the O[facility] gene panel at [facility] showed no deleterious mutations in ATM, BARD1, BRCA1, BRCA2, BRIP1, CDH1, CHEK2, MRE11A, MUTYH, NBN, NF1, PALB2, PTEN, RAD50, RAD51C, RAD51D, and TP53  (3) intensified screening: Initiated June 2015  (a) yearly gynecology visits/ Annabell Key  (b) yearly mammography/yearly MRI, 6 months apart  (c) yearly dermatology screening/ [place]  (d) ophthalmology exam/ [facility] (Q2 yr)   PLAN:  Assessment & Plan At high risk for [facility] Dense breast tissue requires intensified screening due to potential mammogram obscuration. - Order mammogram for September. - Order MRI for April. - No concerns on exam today - Alternate between us  and Dr Annabell Key, please call our office with any new concerns.  Total time spent: 12 min  *Total Encounter Time as defined by the Centers for Medicare and Medicaid Services includes, in addition to the face-to-face time of a patient visit (documented in the  note above) non-face-to-face time: obtaining and reviewing outside history, ordering and reviewing medications, tests or procedures, care coordination (communications with other health care professionals or caregivers) and documentation in the medical record.

## 2023-07-31 ENCOUNTER — Ambulatory Visit: Payer: BC Managed Care – PPO | Admitting: Dermatology

## 2023-07-31 ENCOUNTER — Encounter: Payer: Self-pay | Admitting: Dermatology

## 2023-07-31 DIAGNOSIS — L578 Other skin changes due to chronic exposure to nonionizing radiation: Secondary | ICD-10-CM

## 2023-07-31 DIAGNOSIS — Z1283 Encounter for screening for malignant neoplasm of skin: Secondary | ICD-10-CM

## 2023-07-31 DIAGNOSIS — L720 Epidermal cyst: Secondary | ICD-10-CM

## 2023-07-31 DIAGNOSIS — L814 Other melanin hyperpigmentation: Secondary | ICD-10-CM | POA: Diagnosis not present

## 2023-07-31 DIAGNOSIS — L739 Follicular disorder, unspecified: Secondary | ICD-10-CM | POA: Diagnosis not present

## 2023-07-31 DIAGNOSIS — D1801 Hemangioma of skin and subcutaneous tissue: Secondary | ICD-10-CM

## 2023-07-31 DIAGNOSIS — D229 Melanocytic nevi, unspecified: Secondary | ICD-10-CM

## 2023-07-31 DIAGNOSIS — Z85828 Personal history of other malignant neoplasm of skin: Secondary | ICD-10-CM

## 2023-07-31 DIAGNOSIS — L821 Other seborrheic keratosis: Secondary | ICD-10-CM

## 2023-07-31 DIAGNOSIS — W908XXA Exposure to other nonionizing radiation, initial encounter: Secondary | ICD-10-CM

## 2023-07-31 NOTE — Patient Instructions (Addendum)

## 2023-07-31 NOTE — Progress Notes (Signed)
   Follow-Up Visit   Subjective  Kirsten Wilson is a 43 y.o. female who presents for the following: Skin Cancer Screening and Full Body Skin Exam. Patient with history of BCC. Place at upper back, has decreased in size, not painful.   The patient presents for Total-Body Skin Exam (TBSE) for skin cancer screening and mole check. The patient has spots, moles and lesions to be evaluated, some may be new or changing and the patient may have concern these could be cancer.   The following portions of the chart were reviewed this encounter and updated as appropriate: medications, allergies, medical history  Review of Systems:  No other skin or systemic complaints except as noted in HPI or Assessment and Plan.  Objective  Well appearing patient in no apparent distress; mood and affect are within normal limits.  A full examination was performed including scalp, head, eyes, ears, nose, lips, neck, chest, axillae, abdomen, back, buttocks, bilateral upper extremities, bilateral lower extremities, hands, feet, fingers, toes, fingernails, and toenails. All findings within normal limits unless otherwise noted below.   Exam of nails limited by presence of nail polish.   Relevant physical exam findings are noted in the Assessment and Plan.  Upper Back Pink papules and pustules scattered on upper back  Assessment & Plan   SKIN CANCER SCREENING PERFORMED TODAY.  ACTINIC DAMAGE - Chronic condition, secondary to cumulative UV/sun exposure - diffuse scaly erythematous macules with underlying dyspigmentation - Recommend daily broad spectrum sunscreen SPF 30+ to sun-exposed areas, reapply every 2 hours as needed.  - Staying in the shade or wearing long sleeves, sun glasses (UVA+UVB protection) and wide brim hats (4-inch brim around the entire circumference of the hat) are also recommended for sun protection.  - Call for new or changing lesions.  LENTIGINES, SEBORRHEIC KERATOSES, HEMANGIOMAS -  Benign normal skin lesions - Benign-appearing - Call for any changes  MELANOCYTIC NEVI - Tan-brown and/or pink-flesh-colored symmetric macules and papules - Benign appearing on exam today - Observation - Call clinic for new or changing moles - Recommend daily use of broad spectrum spf 30+ sunscreen to sun-exposed areas.   HISTORY OF BASAL CELL CARCINOMA OF THE SKIN Left upper back nodular pattern excised- 11/05/2017 - No evidence of recurrence today - Recommend regular full body skin exams - Recommend daily broad spectrum sunscreen SPF 30+ to sun-exposed areas, reapply every 2 hours as needed.  - Call if any new or changing lesions are noted between office visits  FOLLICULITIS Upper Back Patient defers treatment  MILIA Right Malar Cheek Benign superficial skin cysts. Benign-appearing.  Observation.  Call clinic for new or changing lesions.  Recommend daily use of broad spectrum spf 30+ sunscreen to sun-exposed areas.   MULTIPLE BENIGN NEVI   LENTIGINES   ACTINIC ELASTOSIS   SEBORRHEIC KERATOSES   CHERRY ANGIOMA    Return in about 1 year (around 07/30/2024) for TBSE, HxBCC, w/ Dr. Felipe Horton.  I, Jacquelynn V. Grier Leber, CMA, am acting as scribe for Harris Liming, MD .   Documentation: I have reviewed the above documentation for accuracy and completeness, and I agree with the above.  Harris Liming, MD

## 2023-08-13 ENCOUNTER — Encounter: Payer: Self-pay | Admitting: Family Medicine

## 2023-08-17 ENCOUNTER — Telehealth: Payer: Self-pay | Admitting: Family Medicine

## 2023-08-17 DIAGNOSIS — Z0000 Encounter for general adult medical examination without abnormal findings: Secondary | ICD-10-CM

## 2023-08-17 NOTE — Telephone Encounter (Signed)
-----   Message from Hinckley English sent at 08/14/2023  1:17 PM EDT ----- Regarding: LAB MON 08/18/23 Hello,  Patient is coming in for CPE labs on Monday 08/18/23. Can we get orders please.   Thanks

## 2023-08-18 ENCOUNTER — Ambulatory Visit: Payer: Self-pay | Admitting: Family Medicine

## 2023-08-18 ENCOUNTER — Other Ambulatory Visit (INDEPENDENT_AMBULATORY_CARE_PROVIDER_SITE_OTHER)

## 2023-08-18 DIAGNOSIS — Z0000 Encounter for general adult medical examination without abnormal findings: Secondary | ICD-10-CM | POA: Diagnosis not present

## 2023-08-18 NOTE — Addendum Note (Signed)
 Addended by: Darcella Earnest on: 08/18/2023 08:09 AM   Modules accepted: Orders

## 2023-08-19 LAB — CBC WITH DIFFERENTIAL/PLATELET
Absolute Lymphocytes: 2444 {cells}/uL (ref 850–3900)
Absolute Monocytes: 377 {cells}/uL (ref 200–950)
Basophils Absolute: 59 {cells}/uL (ref 0–200)
Basophils Relative: 0.9 %
Eosinophils Absolute: 98 {cells}/uL (ref 15–500)
Eosinophils Relative: 1.5 %
HCT: 40.5 % (ref 35.0–45.0)
Hemoglobin: 13.2 g/dL (ref 11.7–15.5)
MCH: 29.5 pg (ref 27.0–33.0)
MCHC: 32.6 g/dL (ref 32.0–36.0)
MCV: 90.4 fL (ref 80.0–100.0)
MPV: 11 fL (ref 7.5–12.5)
Monocytes Relative: 5.8 %
Neutro Abs: 3523 {cells}/uL (ref 1500–7800)
Neutrophils Relative %: 54.2 %
Platelets: 206 10*3/uL (ref 140–400)
RBC: 4.48 10*6/uL (ref 3.80–5.10)
RDW: 12.2 % (ref 11.0–15.0)
Total Lymphocyte: 37.6 %
WBC: 6.5 10*3/uL (ref 3.8–10.8)

## 2023-08-19 LAB — COMPREHENSIVE METABOLIC PANEL WITH GFR
AG Ratio: 2 (calc) (ref 1.0–2.5)
ALT: 11 U/L (ref 6–29)
AST: 15 U/L (ref 10–30)
Albumin: 3.9 g/dL (ref 3.6–5.1)
Alkaline phosphatase (APISO): 43 U/L (ref 31–125)
BUN: 11 mg/dL (ref 7–25)
CO2: 27 mmol/L (ref 20–32)
Calcium: 8.7 mg/dL (ref 8.6–10.2)
Chloride: 108 mmol/L (ref 98–110)
Creat: 0.84 mg/dL (ref 0.50–0.99)
Globulin: 2 g/dL (ref 1.9–3.7)
Glucose, Bld: 77 mg/dL (ref 65–99)
Potassium: 4.1 mmol/L (ref 3.5–5.3)
Sodium: 140 mmol/L (ref 135–146)
Total Bilirubin: 0.3 mg/dL (ref 0.2–1.2)
Total Protein: 5.9 g/dL — ABNORMAL LOW (ref 6.1–8.1)
eGFR: 88 mL/min/{1.73_m2} (ref >=60)

## 2023-08-19 LAB — LIPID PANEL
Cholesterol: 132 mg/dL (ref <200)
HDL: 44 mg/dL — ABNORMAL LOW (ref >=50)
LDL Cholesterol (Calc): 65 mg/dL
Non-HDL Cholesterol (Calc): 88 mg/dL (ref <130)
Total CHOL/HDL Ratio: 3 (calc) (ref <5.0)
Triglycerides: 155 mg/dL — ABNORMAL HIGH (ref <150)

## 2023-08-19 LAB — TSH: TSH: 2.2 m[IU]/L

## 2023-08-22 ENCOUNTER — Encounter: Payer: Self-pay | Admitting: Family Medicine

## 2023-08-22 ENCOUNTER — Ambulatory Visit (INDEPENDENT_AMBULATORY_CARE_PROVIDER_SITE_OTHER): Admitting: Family Medicine

## 2023-08-22 VITALS — BP 118/84 | HR 59 | Temp 98.1°F | Ht 63.0 in | Wt 173.2 lb

## 2023-08-22 DIAGNOSIS — F411 Generalized anxiety disorder: Secondary | ICD-10-CM

## 2023-08-22 DIAGNOSIS — E6609 Other obesity due to excess calories: Secondary | ICD-10-CM | POA: Insufficient documentation

## 2023-08-22 DIAGNOSIS — E66811 Obesity, class 1: Secondary | ICD-10-CM | POA: Diagnosis not present

## 2023-08-22 DIAGNOSIS — Z1239 Encounter for other screening for malignant neoplasm of breast: Secondary | ICD-10-CM

## 2023-08-22 DIAGNOSIS — Z0000 Encounter for general adult medical examination without abnormal findings: Secondary | ICD-10-CM

## 2023-08-22 DIAGNOSIS — Z6830 Body mass index (BMI) 30.0-30.9, adult: Secondary | ICD-10-CM

## 2023-08-22 NOTE — Patient Instructions (Addendum)
 Keep exercising  Keep up the strength training   Try to get most of your carbohydrates from produce (with the exception of white potatoes) and whole grains Eat less bread/pasta/rice/snack foods/cereals/sweets and other items from the middle of the grocery store (processed carbs)  Start vitamin D3 4000 international units daily

## 2023-08-22 NOTE — Assessment & Plan Note (Signed)
 Bmi 30.6 Good effort so far Discussed how this problem influences overall health and the risks it imposes  Reviewed plan for weight loss with lower calorie diet (via better food choices (lower glycemic and portion control) along with exercise building up to or more than 30 minutes 5 days per week including some aerobic activity and strength training   Encouraged to continue strength training  Perimenopause may make it harder to loose

## 2023-08-22 NOTE — Assessment & Plan Note (Signed)
 Reviewed health habits including diet and exercise and skin cancer prevention Reviewed appropriate screening tests for age  Also reviewed health mt list, fam hx and immunization status , as well as social and family history   See HPI Labs reviewed and ordered Health Maintenance  Topic Date Due   HPV Vaccine (1 - Risk 3-dose SCDM series) Never done   Hepatitis C Screening  08/21/2024*   HIV Screening  08/21/2024*   COVID-19 Vaccine (6 - 2024-25 season) 09/06/2024*   Flu Shot  10/03/2023   DTaP/Tdap/Td vaccine (2 - Td or Tdap) 06/13/2024   Mammogram  06/17/2024   Pap with HPV screening  01/09/2028   Meningitis B Vaccine  Aged Out  *Topic was postponed. The date shown is not the original due date.    Utd gyn care and breast cancer screening  Discussed fall prevention, supplements and exercise for bone density  Plan to start colon cancer screening at 44 PHQ 6 , on medication -worsened by hormonal change and stress

## 2023-08-22 NOTE — Assessment & Plan Note (Signed)
 Stable/controlled with lexapro  20 mg and buspar  7.5 mg bid  Encouraged good self care Continue exercise

## 2023-08-22 NOTE — Assessment & Plan Note (Signed)
 Pt continues regular mammograms and MRIs  Also gyn care  Has had genetic testing times 2 Continue to follow

## 2023-08-22 NOTE — Progress Notes (Signed)
 Subjective:    Patient ID: Kirsten Wilson, female    DOB: 07/08/1980, 43 y.o.   MRN: [id number]  HPI  Here for health maintenance exam and to review chronic medical problems   Wt Readings from Last 3 Encounters:  08/22/23 173 lb 4 oz (78.6 kg)  07/09/23 175 lb 3.2 oz (79.5 kg)  02/12/23 172 lb 6.4 oz (78.2 kg)   30.69 kg/m  Vitals:   08/22/23 1550  BP: 118/84  Pulse: (!) 59  Temp: 98.1 F (36.7 C)  SpO2: 100%    Immunization History  Administered Date(s) Administered   Influenza,inj,Quad PF,6+ Mos 03/30/2014, 01/07/2017, 12/24/2017, 11/23/2018, 11/23/2019, 12/13/2020   Influenza-Unspecified 12/30/2021, 01/04/2023   Moderna Covid-19 Vaccine Bivalent Booster 11yrs & up 12/23/2021, 01/04/2023   Moderna Sars-Covid-2 Vaccination 05/01/2019, 05/29/2019, 01/22/2020   PPD Test 10/31/2021   Tdap 06/14/2014    Health Maintenance Due  Topic Date Due   HPV VACCINES (1 - Risk 3-dose SCDM series) Never done   Feeling pretty good    Mammogram 06/2023  Mri normal also  Strong family history of breast cancer Mother at 66 and multiple other cancers  Gets mammograms and MRIs  Self breast exam- no changes   Has had genetic testing twice   Gyn health Oc 1/20 fe  Pap 01/2023  Period is controlled  ? Perimenopause/ some heat intolerance   Trying to loose weight  Good diet  Exercise - strength training and cardio  [place]   Avoiding pasta/bread/junk  More produce and protein   Colon cancer screening -no fam history   Bone health   Falls- none Fractures-none  Supplements - no D  Last vitamin D  Lab Results  Component Value Date   VD25OH 34.0 08/31/2018    Exercise -lots   Derm care Mother had melanoma  Personal history of basal cell   Mood    08/22/2023    3:56 PM 02/12/2023   11:32 AM 01/09/2023    2:01 PM 07/12/2022    4:28 PM 01/02/2022    1:47 PM  Depression screen PHQ 2/9  Decreased Interest 0 0 0 0 0  Down, Depressed, Hopeless 0 0 0 0 0   PHQ - 2 Score 0 0 0 0 0  Altered sleeping 2   1   Tired, decreased energy 3   1   Change in appetite 1   1   Feeling bad or failure about yourself  0   0   Trouble concentrating 0   0   Moving slowly or fidgety/restless 0   0   Suicidal thoughts 0   0   PHQ-9 Score 6   3   Difficult doing work/chores Not difficult at all          08/22/2023    3:56 PM 07/12/2022    4:28 PM 06/06/2017    3:48 PM  GAD 7 : Generalized Anxiety Score  Nervous, Anxious, on Edge 2 1 3   Control/stop worrying 1 0 3  Worry too much - different things 1 0 3  Trouble relaxing 2 2 3   Restless 1 1 3   Easily annoyed or irritable 1 1 2   Afraid - awful might happen 0 0 2  Total GAD 7 Score 8 5 19   Anxiety Difficulty Not difficult at all       GAD Lexapro  20 mg daily  Buspar  7.5 mg bid     Cholesterol Lab Results  Component Value Date   CHOL 132 08/18/2023  CHOL 143 07/05/2022   CHOL 138 08/31/2018   Lab Results  Component Value Date   HDL 44 (L) 08/18/2023   HDL 52 07/05/2022   HDL 51 08/31/2018   Lab Results  Component Value Date   LDLCALC 65 08/18/2023   LDLCALC 77 07/05/2022   LDLCALC 63 08/31/2018   Lab Results  Component Value Date   TRIG 155 (H) 08/18/2023   TRIG 60 07/05/2022   TRIG 119 08/31/2018   Lab Results  Component Value Date   CHOLHDL 3.0 08/18/2023   CHOLHDL 2.8 07/05/2022   CHOLHDL 2.7 08/31/2018   No results found for: LDLDIRECT   Lab Results  Component Value Date   ALT 11 08/18/2023   AST 15 08/18/2023   ALKPHOS 47 12/27/2021   BILITOT 0.3 08/18/2023   Lab Results  Component Value Date   NA 140 08/18/2023   K 4.1 08/18/2023   CO2 27 08/18/2023   GLUCOSE 77 08/18/2023   BUN 11 08/18/2023   CREATININE 0.84 08/18/2023   CALCIUM 8.7 08/18/2023   GFR 106.71 06/09/2014   EGFR 88 08/18/2023   GFRNONAA >60 12/27/2021   Lab Results  Component Value Date   TSH 2.20 08/18/2023   Lab Results  Component Value Date   WBC 6.5 08/18/2023   HGB 13.2  08/18/2023   HCT 40.5 08/18/2023   MCV 90.4 08/18/2023   PLT 206 08/18/2023      Patient Active Problem List   Diagnosis Date Noted   Obesity due to excess calories 08/22/2023   Generalized anxiety disorder 06/06/2017   Arcuate uterus 09/03/2015   Cervical polyp 08/26/2014   Routine general medical examination at a health care facility 06/08/2014   Genetic testing 05/27/2014   Family history of uterine cancer    Breast cancer screening, high risk patient 03/30/2014   Allergic rhinitis 03/30/2014   Migraine 03/30/2014   History of depression 03/30/2014   Family history of breast cancer 03/30/2014   Past Medical History:  Diagnosis Date   Allergy    Anxiety    Asthma    Basal cell carcinoma 11/05/2017   Left upper back. Nodular pattern. Excised   Depression    Family history of malignant neoplasm of breast    Family history of uterine cancer    Migraine    UTI (lower urinary tract infection)    Past Surgical History:  Procedure Laterality Date   BREAST BIOPSY  2011   benign   BREAST BIOPSY Right 2020   BREAST EXCISIONAL BIOPSY Left    RADIOACTIVE SEED GUIDED EXCISIONAL BREAST BIOPSY Left 08/27/2021   Procedure: LEFT BREAST SEED GUIDED EXCISIONAL BIOPSY;  Surgeon: Enid Harry, MD;  Location: [facility];  Service: General;  Laterality: Left;  GEN & LMA   TONSILLECTOMY AND ADENOIDECTOMY  1993   Social History   Tobacco Use   Smoking status: Never   Smokeless tobacco: Never  Vaping Use   Vaping status: Never Used  Substance Use Topics   Alcohol use: Yes    Alcohol/week: 4.0 standard drinks of alcohol    Types: 2 Glasses of wine, 2 Cans of beer per week    Comment: occ   Drug use: No   Family History  Problem Relation Age of Onset   Breast cancer Mother 13   Cancer Mother        uterine @ 78, breast @ 76, and melanoma @ 53   Hypertension Mother    Asthma Mother  Arrhythmia Mother    Anxiety disorder Mother    Hearing loss Mother     Kidney disease Mother    Varicose Veins Mother    Hyperlipidemia Father    Hypertension Father    Diabetes Father    Heart block Father        had angioplasty   Heart disease Father    Obesity Father    Stroke Father    Vision loss Father    Cancer Maternal Aunt        skin & colon polyps (#/type?)   Cancer Maternal Uncle        skin   Breast cancer Paternal Aunt 55   Cancer Paternal Aunt 54       breast; currently 62   Cancer Maternal Grandfather        lung; smoker; deceased 59   Cancer Paternal Grandmother 83       colon cancer; deceased 37   Alcohol abuse Paternal Grandfather    Migraines Other        paternal side   Allergies  Allergen Reactions   Codone [Hydrocodone]     N/v   Elemental Sulfur     Swelling    Penicillins     rash   Current Outpatient Medications on File Prior to Visit  Medication Sig Dispense Refill   albuterol  (VENTOLIN  HFA) 108 (90 Base) MCG/ACT inhaler Inhale 2 puffs into the lungs every 6 (six) hours as needed for wheezing or shortness of breath. 8 g 0   busPIRone  (BUSPAR ) 15 MG tablet TAKE 1/2 TABLET(7.5 MG) BY MOUTH TWICE DAILY 90 tablet 1   escitalopram  (LEXAPRO ) 20 MG tablet TAKE 1 TABLET(20 MG) BY MOUTH DAILY 90 tablet 2   fluticasone  (FLONASE ) 50 MCG/ACT nasal spray SHAKE LIQUID AND USE 2 SPRAYS IN EACH NOSTRIL DAILY 16 g 2   loratadine (CLARITIN) 10 MG tablet Take 10 mg by mouth daily.     norethindrone-ethinyl estradiol-FE (BLISOVI FE 1/20) 1-20 MG-MCG tablet Take 1 tablet by mouth daily. 84 tablet 4   OnabotulinumtoxinA (BOTOX IJ) Inject as directed. Reported on 02/23/2015     SUMAtriptan (IMITREX) 100 MG tablet Take 100 mg by mouth every 2 (two) hours as needed for migraine or headache. Reported on 02/23/2015     No current facility-administered medications on file prior to visit.    Review of Systems  Constitutional:  Positive for fatigue. Negative for activity change, appetite change, fever and unexpected weight change.   HENT:  Negative for congestion, ear pain, rhinorrhea, sinus pressure and sore throat.   Eyes:  Negative for pain, redness and visual disturbance.  Respiratory:  Negative for cough, shortness of breath and wheezing.   Cardiovascular:  Negative for chest pain and palpitations.  Gastrointestinal:  Negative for abdominal pain, blood in stool, constipation and diarrhea.  Endocrine: Negative for polydipsia and polyuria.  Genitourinary:  Negative for dysuria, frequency and urgency.  Musculoskeletal:  Negative for arthralgias, back pain and myalgias.  Skin:  Negative for pallor and rash.  Allergic/Immunologic: Negative for environmental allergies.  Neurological:  Negative for dizziness, syncope and headaches.  Hematological:  Negative for adenopathy. Does not bruise/bleed easily.  Psychiatric/Behavioral:  Positive for sleep disturbance. Negative for decreased concentration and dysphoric mood. The patient is nervous/anxious.        Objective:   Physical Exam Constitutional:      General: She is not in acute distress.    Appearance: Normal appearance. She is well-developed. She is obese.  She is not ill-appearing or diaphoretic.  HENT:     Head: Normocephalic and atraumatic.     Right Ear: Tympanic membrane, ear canal and external ear normal.     Left Ear: Tympanic membrane, ear canal and external ear normal.     Nose: Nose normal. No congestion.     Mouth/Throat:     Mouth: Mucous membranes are moist.     Pharynx: Oropharynx is clear. No posterior oropharyngeal erythema.   Eyes:     General: No scleral icterus.    Extraocular Movements: Extraocular movements intact.     Conjunctiva/sclera: Conjunctivae normal.     Pupils: Pupils are equal, round, and reactive to light.   Neck:     Thyroid : No thyromegaly.     Vascular: No carotid bruit or JVD.   Cardiovascular:     Rate and Rhythm: Normal rate and regular rhythm.     Pulses: Normal pulses.     Heart sounds: Normal heart sounds.      No gallop.  Pulmonary:     Effort: Pulmonary effort is normal. No respiratory distress.     Breath sounds: Normal breath sounds. No wheezing.     Comments: Good air exch Chest:     Chest wall: No tenderness.  Abdominal:     General: Bowel sounds are normal. There is no distension or abdominal bruit.     Palpations: Abdomen is soft. There is no mass.     Tenderness: There is no abdominal tenderness.     Hernia: No hernia is present.  Genitourinary:    Comments: Breast exam: No mass, nodules, thickening, tenderness, bulging, retraction, inflamation, nipple discharge or skin changes noted.  No axillary or clavicular LA.     Dense tissue  Post surgical changes baseline   Musculoskeletal:        General: No tenderness. Normal range of motion.     Cervical back: Normal range of motion and neck supple. No rigidity. No muscular tenderness.     Right lower leg: No edema.     Left lower leg: No edema.     Comments: No kyphosis   Lymphadenopathy:     Cervical: No cervical adenopathy.   Skin:    General: Skin is warm and dry.     Coloration: Skin is not pale.     Findings: No erythema or rash.     Comments: Solar lentigines diffusely    Neurological:     Mental Status: She is alert. Mental status is at baseline.     Cranial Nerves: No cranial nerve deficit.     Motor: No abnormal muscle tone.     Coordination: Coordination normal.     Gait: Gait normal.     Deep Tendon Reflexes: Reflexes are normal and symmetric. Reflexes normal.   Psychiatric:        Mood and Affect: Mood normal.        Cognition and Memory: Cognition and memory normal.           Assessment & Plan:   Problem List Items Addressed This Visit       Other   Routine general medical examination at a health care facility - Primary   Reviewed health habits including diet and exercise and skin cancer prevention Reviewed appropriate screening tests for age  Also reviewed health mt list, fam hx and immunization  status , as well as social and family history   See HPI Labs reviewed and ordered Health Maintenance  Topic Date Due   HPV Vaccine (1 - Risk 3-dose SCDM series) Never done   Hepatitis C Screening  08/21/2024*   HIV Screening  08/21/2024*   COVID-19 Vaccine (6 - 2024-25 season) 09/06/2024*   Flu Shot  10/03/2023   DTaP/Tdap/Td vaccine (2 - Td or Tdap) 06/13/2024   Mammogram  06/17/2024   Pap with HPV screening  01/09/2028   Meningitis B Vaccine  Aged Out  *Topic was postponed. The date shown is not the original due date.    Utd gyn care and breast cancer screening  Discussed fall prevention, supplements and exercise for bone density  Plan to start colon cancer screening at 44 PHQ 6 , on medication -worsened by hormonal change and stress      Obesity due to excess calories   Bmi 30.6 Good effort so far Discussed how this problem influences overall health and the risks it imposes  Reviewed plan for weight loss with lower calorie diet (via better food choices (lower glycemic and portion control) along with exercise building up to or more than 30 minutes 5 days per week including some aerobic activity and strength training   Encouraged to continue strength training  Perimenopause may make it harder to loose       Generalized anxiety disorder   Stable/controlled with lexapro  20 mg and buspar  7.5 mg bid  Encouraged good self care Continue exercise         Breast cancer screening, high risk patient   Pt continues regular mammograms and MRIs  Also gyn care  Has had genetic testing times 2 Continue to follow

## 2023-09-14 ENCOUNTER — Other Ambulatory Visit: Payer: Self-pay | Admitting: Family Medicine

## 2023-11-12 ENCOUNTER — Ambulatory Visit
Admission: RE | Admit: 2023-11-12 | Discharge: 2023-11-12 | Disposition: A | Discharge status: Home / Self Care | Source: Ambulatory Visit | Attending: Hematology and Oncology

## 2023-11-12 DIAGNOSIS — Z1239 Encounter for other screening for malignant neoplasm of breast: Secondary | ICD-10-CM

## 2023-11-13 ENCOUNTER — Encounter: Payer: Self-pay | Admitting: Family Medicine

## 2023-11-13 ENCOUNTER — Ambulatory Visit

## 2024-01-02 ENCOUNTER — Other Ambulatory Visit: Payer: Self-pay | Admitting: Family Medicine

## 2024-01-02 DIAGNOSIS — F411 Generalized anxiety disorder: Secondary | ICD-10-CM

## 2024-01-12 ENCOUNTER — Encounter (HOSPITAL_BASED_OUTPATIENT_CLINIC_OR_DEPARTMENT_OTHER): Payer: Self-pay | Admitting: Obstetrics & Gynecology

## 2024-01-12 ENCOUNTER — Encounter: Admitting: Family Medicine

## 2024-01-12 ENCOUNTER — Ambulatory Visit (HOSPITAL_BASED_OUTPATIENT_CLINIC_OR_DEPARTMENT_OTHER): Payer: BC Managed Care – PPO | Admitting: Obstetrics & Gynecology

## 2024-01-12 ENCOUNTER — Other Ambulatory Visit (HOSPITAL_COMMUNITY)
Admission: RE | Admit: 2024-01-12 | Discharge: 2024-01-12 | Disposition: A | Discharge status: Home / Self Care | Source: Ambulatory Visit | Attending: Obstetrics & Gynecology | Admitting: Obstetrics & Gynecology

## 2024-01-12 VITALS — BP 125/92 | HR 66 | Ht 62.0 in | Wt 176.0 lb

## 2024-01-12 DIAGNOSIS — Z1151 Encounter for screening for human papillomavirus (HPV): Secondary | ICD-10-CM | POA: Diagnosis not present

## 2024-01-12 DIAGNOSIS — Z803 Family history of malignant neoplasm of breast: Secondary | ICD-10-CM | POA: Diagnosis not present

## 2024-01-12 DIAGNOSIS — Z124 Encounter for screening for malignant neoplasm of cervix: Secondary | ICD-10-CM | POA: Insufficient documentation

## 2024-01-12 DIAGNOSIS — Z8049 Family history of malignant neoplasm of other genital organs: Secondary | ICD-10-CM

## 2024-01-12 DIAGNOSIS — Z1331 Encounter for screening for depression: Secondary | ICD-10-CM

## 2024-01-12 DIAGNOSIS — Z01419 Encounter for gynecological examination (general) (routine) without abnormal findings: Secondary | ICD-10-CM | POA: Diagnosis not present

## 2024-01-12 NOTE — Progress Notes (Signed)
 ANNUAL EXAM Patient name: Kirsten Wilson MRN [id number]  Date of birth: September 20, 1980 Chief Complaint:   Gynecologic Exam  History of Present Illness:   Kirsten Wilson is a 43 y.o. G0P0000 Caucasian female being seen today for a routine annual exam.   Had a hand sprain at work.  Wearing a brace.  Has been two weeks.  Has not seen a specialist yet.  On OCPs.  Cycles last three to four days.  Does need RF.    Frustrated with weight.  Had blood work in June.   Not currently SA.    LMP: 12/22/2023   Last pap 01/09/2023. Results were: NILM w/ HRHPV not done. H/O abnormal pap: no Last mammogram: 11/12/2023. Results were: normal. Family h/o breast cancer: yes mother and paternal aunt.  MRI planned for April.  Ordered by Dr. Loretha.   Last colonoscopy: Family h/o colorectal cancer: yes paternal grandmother.     01/12/2024    1:28 PM 08/22/2023    3:56 PM 02/12/2023   11:32 AM 01/09/2023    2:01 PM 07/12/2022    4:28 PM  Depression screen PHQ 2/9  Decreased Interest 0 0 0 0 0  Down, Depressed, Hopeless 1 0 0 0 0  PHQ - 2 Score 1 0 0 0 0  Altered sleeping 2 2   1   Tired, decreased energy 1 3   1   Change in appetite 0 1   1  Feeling bad or failure about yourself  0 0   0  Trouble concentrating 0 0   0  Moving slowly or fidgety/restless 0 0   0  Suicidal thoughts 0 0   0  PHQ-9 Score 4 6    3    Difficult doing work/chores Not difficult at all Not difficult at all        Data saved with a previous flowsheet row definition        08/22/2023    3:56 PM 07/12/2022    4:28 PM 06/06/2017    3:48 PM  GAD 7 : Generalized Anxiety Score  Nervous, Anxious, on Edge 2 1 3   Control/stop worrying 1 0 3  Worry too much - different things 1 0 3  Trouble relaxing 2 2 3   Restless 1 1 3   Easily annoyed or irritable 1 1 2   Afraid - awful might happen 0 0 2  Total GAD 7 Score 8 5 19   Anxiety Difficulty Not difficult at all       Review of Systems:   Pertinent items are noted in HPI Denies  any urinary or bowel changes.  Denies pelvic pain.   Pertinent History Reviewed:  Reviewed past medical,surgical, social and family history.  Reviewed problem list, medications and allergies. Physical Assessment:   Vitals:   01/12/24 1325  BP: (!) 125/92  Pulse: 66  SpO2: 100%  Weight: 176 lb (79.8 kg)  Height: 5' 2 (1.575 m)  Body mass index is 32.19 kg/m.        Physical Examination:   General appearance - well appearing, and in no distress  Mental status - alert, oriented to person, place, and time  Psych:  She has a normal mood and affect  Skin - warm and dry, normal color, no suspicious lesions noted  Chest - effort normal, all lung fields clear to auscultation bilaterally  Heart - normal rate and regular rhythm  Neck:  midline trachea, no thyromegaly or nodules  Breasts - breasts appear normal, no suspicious  masses, no skin or nipple changes or  axillary nodes  Abdomen - soft, nontender, nondistended, no masses or organomegaly  Pelvic - VULVA: normal appearing vulva with no masses, tenderness or lesions   VAGINA: normal appearing vagina with normal color and discharge, no lesions   CERVIX: normal appearing cervix without discharge or lesions, no CMT  Thin prep pap is updated today.  Did not obtained HR HPV today  UTERUS: uterus is felt to be normal size, shape, consistency and nontender   ADNEXA: No adnexal masses or tenderness noted.  Rectal - normal rectal, good sphincter tone, no masses felt.   Extremities:  No swelling or varicosities noted  Chaperone present for exam  No results found for this or any previous visit (from the past 24 hours).  Assessment & Plan:  1. Well woman exam with routine gynecological exam (Primary) - Pap smear updated today - Mammogram 11/2023.  MRI scheduled for April - Colonoscopy will be planned after next birthday - lab work done with PCP, Dr. Randeen - vaccines reviewed/updated  2. Cervical cancer screening - Cytology - PAP( [facility])  3. Family history of uterine cancer - US  PELVIS TRANSVAGINAL NON-OB (TV ONLY); Future - she will return for this  4. Family history of breast cancer - followed by Dr. Loretha.   Orders Placed This Encounter  Procedures   US  PELVIS TRANSVAGINAL NON-OB (TV ONLY)    Meds: No orders of the defined types were placed in this encounter.   Follow-up: Return in about 1 year (around 01/11/2025).  Ronal GORMAN Pinal, MD 01/12/2024 2:17 PM

## 2024-01-13 LAB — CYTOLOGY - PAP
Adequacy: ABSENT
Diagnosis: NEGATIVE

## 2024-01-14 ENCOUNTER — Ambulatory Visit (HOSPITAL_BASED_OUTPATIENT_CLINIC_OR_DEPARTMENT_OTHER): Payer: Self-pay | Admitting: Obstetrics & Gynecology

## 2024-01-14 ENCOUNTER — Encounter (HOSPITAL_BASED_OUTPATIENT_CLINIC_OR_DEPARTMENT_OTHER): Payer: Self-pay | Admitting: Obstetrics & Gynecology

## 2024-02-11 ENCOUNTER — Encounter (HOSPITAL_BASED_OUTPATIENT_CLINIC_OR_DEPARTMENT_OTHER): Payer: Self-pay | Admitting: Obstetrics & Gynecology

## 2024-02-11 ENCOUNTER — Ambulatory Visit (INDEPENDENT_AMBULATORY_CARE_PROVIDER_SITE_OTHER)

## 2024-02-11 ENCOUNTER — Ambulatory Visit (HOSPITAL_BASED_OUTPATIENT_CLINIC_OR_DEPARTMENT_OTHER): Admitting: Obstetrics & Gynecology

## 2024-02-11 VITALS — BP 121/75 | HR 62 | Ht 62.0 in | Wt 176.6 lb

## 2024-02-11 DIAGNOSIS — Z8049 Family history of malignant neoplasm of other genital organs: Secondary | ICD-10-CM

## 2024-02-11 DIAGNOSIS — Z803 Family history of malignant neoplasm of breast: Secondary | ICD-10-CM

## 2024-02-11 DIAGNOSIS — Q51810 Arcuate uterus: Secondary | ICD-10-CM | POA: Diagnosis not present

## 2024-02-11 NOTE — Progress Notes (Unsigned)
° °  Ultrasound f/u Patient name: Kirsten Wilson MRN [id number]  Date of birth: [birth year] Chief Complaint:   Follow-up  History of Present Illness:   Kirsten Wilson is a 43 y.o. G0P0000 Caucasian female being seen today for discussion of ultrasound findings.  Strong family hx of endometrial cancer.  Yearly ultrasound recommended by oncology.  Pt on OCPs with no abnormal bleeding.  Ultrasound normal today.  Endometrium thin.   Patient's last menstrual period was 01/13/2024 (approximate).   Last pap 01/12/2024. Results were: WNL.  Review of Systems:   Pertinent items are noted in HPI Denies any urinary or bowel changes or pelvic pain Pertinent History Reviewed:  Reviewed past medical,surgical, social and family history.  Reviewed problem list, medications and allergies. Physical Assessment:   Vitals:   02/11/24 1501  BP: 121/75  Pulse: 62  SpO2: 100%  Weight: 176 lb 9.6 oz (80.1 kg)  Height: 5' 2 (1.575 m)  Body mass index is 32.3 kg/m.        Physical Examination:   General appearance - well appearing, and in no distress  Mental status - alert, oriented to person, place, and time  Psych:  She has a normal mood and affect  Assessment & Plan:  1. Family history of uterine cancer (Primary) - ultrasound reassuring.  Will repeat 1 year.  2. Family history of breast cancer  3. Arcuate uterus  Follow-up: Return in about 1 year (around 02/10/2025).  Ronal GORMAN Pinal, MD 02/15/2024 2:03 PM GYNECOLOGY  VISIT

## 2024-03-24 ENCOUNTER — Other Ambulatory Visit (HOSPITAL_BASED_OUTPATIENT_CLINIC_OR_DEPARTMENT_OTHER): Payer: Self-pay

## 2024-03-24 DIAGNOSIS — Z8049 Family history of malignant neoplasm of other genital organs: Secondary | ICD-10-CM

## 2024-03-24 MED ORDER — BLISOVI FE 1/20 1-20 MG-MCG PO TABS: 1.0000 | ORAL_TABLET | Freq: Every day | ORAL | 4 refills | Status: AC

## 2024-06-23 ENCOUNTER — Other Ambulatory Visit

## 2024-06-24 ENCOUNTER — Other Ambulatory Visit

## 2024-07-08 ENCOUNTER — Ambulatory Visit: Admitting: Hematology and Oncology

## 2024-08-05 ENCOUNTER — Ambulatory Visit: Admitting: Dermatology
